# Patient Record
Sex: Female | Born: 1935 | Race: White | Hispanic: No | Smoking: Former smoker
Health system: Southern US, Community
[De-identification: ages and names within clinical notes are randomized; demographics above are authoritative.]

## PROBLEM LIST (undated history)

## (undated) DIAGNOSIS — B019 Varicella without complication: Secondary | ICD-10-CM

## (undated) DIAGNOSIS — C50919 Malignant neoplasm of unspecified site of unspecified female breast: Secondary | ICD-10-CM

## (undated) HISTORY — PX: ABDOMINAL HYSTERECTOMY: SHX81

## (undated) HISTORY — DX: Varicella without complication: B01.9

## (undated) HISTORY — PX: BREAST BIOPSY: SHX20

## (undated) HISTORY — PX: APPENDECTOMY: SHX54

---

## 2005-09-20 ENCOUNTER — Ambulatory Visit: Payer: Self-pay | Admitting: Family Medicine

## 2010-07-06 ENCOUNTER — Ambulatory Visit: Payer: Self-pay | Admitting: Family Medicine

## 2013-01-05 ENCOUNTER — Ambulatory Visit: Payer: Self-pay | Admitting: Nurse Practitioner

## 2013-01-21 ENCOUNTER — Ambulatory Visit: Payer: Self-pay | Admitting: Surgery

## 2013-01-21 LAB — HEMOGLOBIN: HGB: 13.3 g/dL

## 2013-01-29 ENCOUNTER — Ambulatory Visit: Payer: Self-pay | Admitting: Surgery

## 2013-02-09 ENCOUNTER — Ambulatory Visit: Payer: Self-pay | Admitting: Hematology and Oncology

## 2013-02-10 ENCOUNTER — Ambulatory Visit: Payer: Self-pay | Admitting: Hematology and Oncology

## 2013-02-25 ENCOUNTER — Ambulatory Visit: Payer: Self-pay | Admitting: Hematology and Oncology

## 2013-03-04 LAB — CBC CANCER CENTER
Basophil #: 0 x10 3/mm (ref 0.0–0.1)
Basophil %: 0.7 %
Eosinophil #: 0.1 x10 3/mm (ref 0.0–0.7)
Eosinophil %: 1.9 %
HCT: 39.2 % (ref 35.0–47.0)
MCH: 31.5 pg (ref 26.0–34.0)
MCHC: 33.9 g/dL (ref 32.0–36.0)
Monocyte %: 8.7 %
Neutrophil #: 2.5 x10 3/mm (ref 1.4–6.5)
Neutrophil %: 47.1 %
Platelet: 214 x10 3/mm (ref 150–440)
RBC: 4.21 10*6/uL (ref 3.80–5.20)
RDW: 14 % (ref 11.5–14.5)
WBC: 5.3 x10 3/mm (ref 3.6–11.0)

## 2013-03-04 LAB — COMPREHENSIVE METABOLIC PANEL
Albumin: 3.8 g/dL (ref 3.4–5.0)
Anion Gap: 5 — ABNORMAL LOW (ref 7–16)
BUN: 12 mg/dL (ref 7–18)
Co2: 30 mmol/L (ref 21–32)
Creatinine: 0.87 mg/dL (ref 0.60–1.30)
EGFR (African American): >60
Glucose: 100 mg/dL — ABNORMAL HIGH (ref 65–99)
Osmolality: 277 (ref 275–301)
Potassium: 4.8 mmol/L (ref 3.5–5.1)
SGOT(AST): 24 U/L (ref 15–37)
SGPT (ALT): 39 U/L (ref 12–78)
Sodium: 139 mmol/L (ref 136–145)

## 2013-03-28 ENCOUNTER — Ambulatory Visit: Payer: Self-pay | Admitting: Hematology and Oncology

## 2013-05-11 ENCOUNTER — Ambulatory Visit: Payer: Self-pay | Admitting: Hematology and Oncology

## 2013-05-11 LAB — CBC CANCER CENTER
Basophil #: 0 x10 3/mm (ref 0.0–0.1)
Basophil %: 0.6 %
HCT: 38 % (ref 35.0–47.0)
Lymphocyte #: 2.3 x10 3/mm (ref 1.0–3.6)
MCH: 30.2 pg (ref 26.0–34.0)
MCHC: 32.7 g/dL (ref 32.0–36.0)
Monocyte %: 7.5 %
Neutrophil #: 3.1 x10 3/mm (ref 1.4–6.5)
Neutrophil %: 52 %
Platelet: 236 x10 3/mm (ref 150–440)
RBC: 4.11 10*6/uL (ref 3.80–5.20)
RDW: 13.8 % (ref 11.5–14.5)
WBC: 5.9 x10 3/mm (ref 3.6–11.0)

## 2013-05-11 LAB — COMPREHENSIVE METABOLIC PANEL
Alkaline Phosphatase: 93 U/L
Bilirubin,Total: 0.3 mg/dL (ref 0.2–1.0)
Co2: 28 mmol/L (ref 21–32)
EGFR (Non-African Amer.): >60
Osmolality: 287 (ref 275–301)
SGOT(AST): 20 U/L (ref 15–37)
SGPT (ALT): 38 U/L (ref 12–78)
Sodium: 143 mmol/L (ref 136–145)

## 2013-05-28 ENCOUNTER — Ambulatory Visit: Payer: Self-pay | Admitting: Hematology and Oncology

## 2013-05-28 HISTORY — PX: MASTECTOMY: SHX3

## 2013-09-14 ENCOUNTER — Ambulatory Visit: Payer: Self-pay | Admitting: Hematology and Oncology

## 2013-09-14 LAB — COMPREHENSIVE METABOLIC PANEL
ALBUMIN: 3.6 g/dL (ref 3.4–5.0)
ALK PHOS: 109 U/L
Anion Gap: 7 (ref 7–16)
BUN: 16 mg/dL (ref 7–18)
Bilirubin,Total: 0.3 mg/dL (ref 0.2–1.0)
CALCIUM: 9.2 mg/dL (ref 8.5–10.1)
CREATININE: 0.92 mg/dL (ref 0.60–1.30)
Chloride: 103 mmol/L (ref 98–107)
Co2: 29 mmol/L (ref 21–32)
EGFR (Non-African Amer.): >60
Glucose: 94 mg/dL (ref 65–99)
Osmolality: 278 (ref 275–301)
POTASSIUM: 4.1 mmol/L (ref 3.5–5.1)
SGOT(AST): 19 U/L (ref 15–37)
SGPT (ALT): 31 U/L (ref 12–78)
SODIUM: 139 mmol/L (ref 136–145)
TOTAL PROTEIN: 7.5 g/dL (ref 6.4–8.2)

## 2013-09-14 LAB — CBC CANCER CENTER
BASOS ABS: 0.1 x10 3/mm (ref 0.0–0.1)
Basophil %: 1 %
EOS ABS: 0.1 x10 3/mm (ref 0.0–0.7)
EOS PCT: 1 %
HCT: 39.4 % (ref 35.0–47.0)
HGB: 12.8 g/dL (ref 12.0–16.0)
Lymphocyte #: 2.7 x10 3/mm (ref 1.0–3.6)
Lymphocyte %: 34.9 %
MCH: 29.9 pg (ref 26.0–34.0)
MCHC: 32.5 g/dL (ref 32.0–36.0)
MCV: 92 fL (ref 80–100)
Monocyte #: 0.5 x10 3/mm (ref 0.2–0.9)
Monocyte %: 7 %
NEUTROS ABS: 4.3 x10 3/mm (ref 1.4–6.5)
Neutrophil %: 56.1 %
Platelet: 251 x10 3/mm (ref 150–440)
RBC: 4.29 10*6/uL (ref 3.80–5.20)
RDW: 13.9 % (ref 11.5–14.5)
WBC: 7.7 x10 3/mm (ref 3.6–11.0)

## 2013-09-25 ENCOUNTER — Ambulatory Visit: Payer: Self-pay | Admitting: Hematology and Oncology

## 2013-12-02 ENCOUNTER — Ambulatory Visit: Payer: Self-pay | Admitting: Hematology and Oncology

## 2013-12-02 LAB — COMPREHENSIVE METABOLIC PANEL
ALK PHOS: 109 U/L
ALT: 34 U/L (ref 12–78)
Albumin: 3.8 g/dL (ref 3.4–5.0)
Anion Gap: 6 — ABNORMAL LOW (ref 7–16)
BUN: 16 mg/dL (ref 7–18)
Bilirubin,Total: 0.3 mg/dL (ref 0.2–1.0)
CALCIUM: 9.3 mg/dL (ref 8.5–10.1)
Chloride: 106 mmol/L (ref 98–107)
Co2: 31 mmol/L (ref 21–32)
Creatinine: 0.92 mg/dL (ref 0.60–1.30)
EGFR (African American): >60
EGFR (Non-African Amer.): >60
Glucose: 98 mg/dL (ref 65–99)
Osmolality: 286 (ref 275–301)
Potassium: 4.5 mmol/L (ref 3.5–5.1)
SGOT(AST): 19 U/L (ref 15–37)
Sodium: 143 mmol/L (ref 136–145)
TOTAL PROTEIN: 7.9 g/dL (ref 6.4–8.2)

## 2013-12-02 LAB — CBC CANCER CENTER
BASOS PCT: 0.8 %
Basophil #: 0.1 x10 3/mm (ref 0.0–0.1)
EOS PCT: 1.4 %
Eosinophil #: 0.1 x10 3/mm (ref 0.0–0.7)
HCT: 40.9 % (ref 35.0–47.0)
HGB: 13.5 g/dL (ref 12.0–16.0)
Lymphocyte #: 2.4 x10 3/mm (ref 1.0–3.6)
Lymphocyte %: 36.3 %
MCH: 30.6 pg (ref 26.0–34.0)
MCHC: 32.9 g/dL (ref 32.0–36.0)
MCV: 93 fL (ref 80–100)
MONOS PCT: 7.2 %
Monocyte #: 0.5 x10 3/mm (ref 0.2–0.9)
Neutrophil #: 3.6 x10 3/mm (ref 1.4–6.5)
Neutrophil %: 54.3 %
Platelet: 230 x10 3/mm (ref 150–440)
RBC: 4.4 10*6/uL (ref 3.80–5.20)
RDW: 14.2 % (ref 11.5–14.5)
WBC: 6.6 x10 3/mm (ref 3.6–11.0)

## 2013-12-03 LAB — CANCER ANTIGEN 27.29: CA 27.29: 27.1 U/mL (ref 0.0–38.6)

## 2013-12-26 ENCOUNTER — Ambulatory Visit: Payer: Self-pay | Admitting: Hematology and Oncology

## 2014-01-11 ENCOUNTER — Ambulatory Visit: Payer: Self-pay | Admitting: Oncology

## 2014-01-14 LAB — PATHOLOGY REPORT

## 2014-01-26 ENCOUNTER — Ambulatory Visit: Payer: Self-pay | Admitting: Hematology and Oncology

## 2014-09-17 NOTE — Discharge Summary (Signed)
PATIENT NAME:  Kristin Valdez, BROUSSARD MR#:  [id number] DATE OF BIRTH:  1935-12-08  DATE OF ADMISSION:  01/29/2013 DATE OF DISCHARGE:  01/30/2013  FINAL DIAGNOSIS: Left breast carcinoma.   PROCEDURES: Left simple mastectomy with axillary lymph node biopsy and partial axillary lymph node dissection.   HOSPITAL COURSE SUMMARY: The patient was admitted for observation postoperatively. She tolerated intravenous morphine well. Her Jackson-Pratt drain remained functional. On postoperative day #1, the patient was doing well, ambulating, eating a regular diet and tolerating oral pain medication. She was deemed suitable for discharge. She was taught Jackson-Pratt care. Follow up with me on September the 11th for staple removal.   DISCHARGE MEDICATIONS:  1.  Ibuprofen 200 mg by mouth b.i.d. as needed for pain.  2.  Percocet 5/325 1 tab every 6 hours as needed for pain. 3.  Call with any questions or concerns.   ____________________________ Jeannette How Marina Gravel, MD mab:nts D: 02/07/2013 17:49:57 ET T: 02/08/2013 01:39:04 ET JOB#: [id number]  cc: Elta Guadeloupe A. Marina Gravel, MD, <Dictator> Hortencia Conradi MD ELECTRONICALLY SIGNED 02/08/2013 17:52

## 2014-09-17 NOTE — Op Note (Signed)
PATIENT NAME:  Kristin Valdez, TOPPINS MR#:  [id number] DATE OF BIRTH:  1935/08/06  DATE OF PROCEDURE:  01/29/2013  PREOPERATIVE DIAGNOSIS: Left breast carcinoma.   POSTOPERATIVE DIAGNOSIS: Left breast carcinoma.  PROCEDURE PERFORMED: Left simple mastectomy with axillary lymph node biopsy of sentinel lymph nodes.   SURGEON: Sherri Rad, M.D.   ASSISTANT: None.   ANESTHESIA: General endotracheal.   FINDINGS: Sentinel lymph node was negative by frozen analysis.   ESTIMATED BLOOD LOSS: 100 mL.   DRAINS: 19 Blake x 2.   DESCRIPTION OF PROCEDURE: With informed consent, supine position, general anesthesia being induced, the patient's left breast, chest, arm and axilla were sterilely prepped and draped. Timeout was observed. I started with an incision encompassing the lateral aspect of the mastectomy incision with an attempt to identify the sentinel lymph node. Prior to prepping, a total of 2 mL mixed 1:1 with normal saline and methylene blue was infiltrated in the periareolar tissues. One solitary blue and hot lymph node was identified. Frozen section demonstrated no obvious macro-metastatic disease. Mastectomy was then performed in the usual fashion to the usual borders with electrocautery. Bleeding points were controlled with the Harmonic scalpel as well as a suture of 3-0 silk on the chest wall. The specimen was handed off the field. The wound was then irrigated. Sprayed thrombin was then placed over the muscle. Two drains were directed, one into the axilla and one into the chest wall. Drain sites were secured with nylon suture. With hemostasis being ensured on the operative field, the wound was then reapproximated utilizing interrupted deep dermal 3-0 Vicryl followed by skin staples in the skin. Bulky dressing was applied. The patient was then extubated and taken to the recovery room in stable and satisfactory condition by anesthesia services.    ____________________________ Jeannette How Marina Gravel,  MD mab:aw D: 01/30/2013 08:52:56 ET T: 01/30/2013 09:31:02 ET JOB#: [id number]  cc: Elta Guadeloupe A. Marina Gravel, MD, <Dictator> Hortencia Conradi MD ELECTRONICALLY SIGNED 02/03/2013 9:43

## 2014-09-20 DIAGNOSIS — J209 Acute bronchitis, unspecified: Secondary | ICD-10-CM | POA: Diagnosis not present

## 2014-10-11 DIAGNOSIS — H0012 Chalazion right lower eyelid: Secondary | ICD-10-CM | POA: Diagnosis not present

## 2014-10-22 DIAGNOSIS — H02053 Trichiasis without entropian right eye, unspecified eyelid: Secondary | ICD-10-CM | POA: Diagnosis not present

## 2014-12-09 DIAGNOSIS — H02053 Trichiasis without entropian right eye, unspecified eyelid: Secondary | ICD-10-CM | POA: Diagnosis not present

## 2014-12-20 IMAGING — NM NM SENTINAL NODE INJECTION (BREAST) - NO REPORT
2 series · 12 of 12 positions shown · non-contrast
Comparison: none

REASON FOR EXAM: L partial mastectomy 11am  [DATE]
COMMENTS:
TECHNIQUE: Using sterile technique and a 27 gauge, 1/2 inch needle, the
radiopharmaceutical was injected into the subcutaneous periareolar tissues
of the left breast. Planar images were obtained in the anterior and left
lateral projections, both with and without the use of a Po-30 transmission
source. The patient's arm was abducted at 90 degrees from the body for the
anterior  images, and raised over the head for the lateral images.

[Series 1000: sent node breast static · 2.40mm/px · 3 acquisitions, 6 frames shown]
[im 1/3]
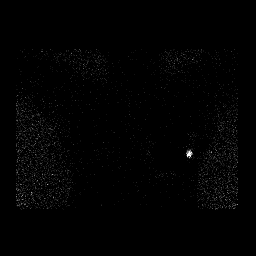
[im 1/3]
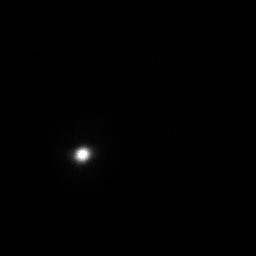
[im 2/3]
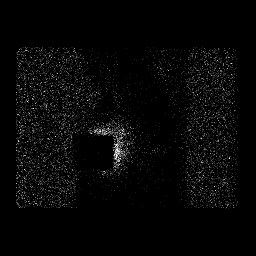
[im 2/3]
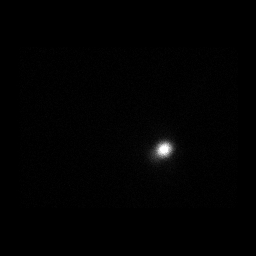
[im 3/3]
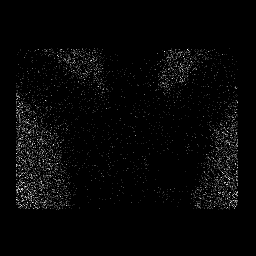
[im 3/3]
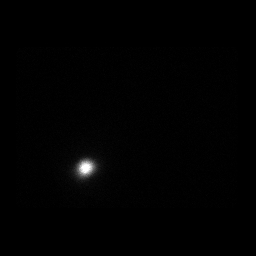

[Series 1000: sent node breast-dynamic · 4.80mm/px · 6 of 59 frames shown]
[frame 5/59  full-range]
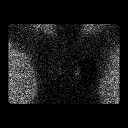
[frame 15/59  full-range]
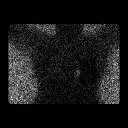
[frame 25/59  full-range]
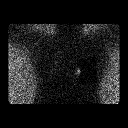
[frame 35/59  full-range]
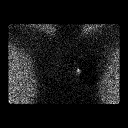
[frame 45/59  full-range]
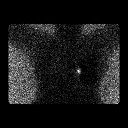
[frame 55/59  full-range]
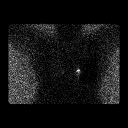

[12 of 12 positions shown; findings below may reference images not displayed]

PROCEDURE:     NM  - NM SENTINEL NODE  BREAST  - January 29, 2013 [DATE]

RESULT:     Comparison:  No comparison.

Radiopharmaceutical:  0.991 mCi Nc-VVm sulfur colloid in 2 ml volume,
injected subcutaneously into the periareolar tissues of the left breast.

Clinical Indication:  77-year-old female with carcinoma of the left breast.
Lymphatic mapping is requested prior to planned left axillary sentinel node
biopsy for interoperative localization.
FINDINGS: Images obtained at 90 minutes post injection demonstrate
localization of radiotracer in the region of the left lateral breast.
IMPRESSION: Successful injection of radiotracer into the left breast
periareolar soft tissues for interoperative localization of left breast
sentinel lymph nodes with a gamma probe.

## 2015-02-03 DIAGNOSIS — C44102 Unspecified malignant neoplasm of skin of right eyelid, including canthus: Secondary | ICD-10-CM | POA: Diagnosis not present

## 2015-02-03 DIAGNOSIS — H02053 Trichiasis without entropian right eye, unspecified eyelid: Secondary | ICD-10-CM | POA: Diagnosis not present

## 2015-02-03 DIAGNOSIS — H0012 Chalazion right lower eyelid: Secondary | ICD-10-CM | POA: Diagnosis not present

## 2015-04-07 DIAGNOSIS — H02053 Trichiasis without entropian right eye, unspecified eyelid: Secondary | ICD-10-CM | POA: Diagnosis not present

## 2015-07-04 DIAGNOSIS — H2513 Age-related nuclear cataract, bilateral: Secondary | ICD-10-CM | POA: Diagnosis not present

## 2015-07-04 DIAGNOSIS — H02052 Trichiasis without entropian right lower eyelid: Secondary | ICD-10-CM | POA: Diagnosis not present

## 2015-07-04 DIAGNOSIS — H02051 Trichiasis without entropian right upper eyelid: Secondary | ICD-10-CM | POA: Diagnosis not present

## 2015-07-04 DIAGNOSIS — H02059 Trichiasis without entropian unspecified eye, unspecified eyelid: Secondary | ICD-10-CM | POA: Diagnosis not present

## 2015-07-04 DIAGNOSIS — Z0100 Encounter for examination of eyes and vision without abnormal findings: Secondary | ICD-10-CM | POA: Diagnosis not present

## 2016-03-26 ENCOUNTER — Other Ambulatory Visit: Payer: Self-pay | Admitting: Family Medicine

## 2016-03-26 DIAGNOSIS — N6311 Unspecified lump in the right breast, upper outer quadrant: Secondary | ICD-10-CM

## 2016-04-13 ENCOUNTER — Ambulatory Visit
Admission: RE | Admit: 2016-04-13 | Discharge: 2016-04-13 | Disposition: A | Discharge status: Home / Self Care | Payer: Commercial Managed Care - HMO | Source: Ambulatory Visit | Attending: Family Medicine | Admitting: Family Medicine

## 2016-04-13 ENCOUNTER — Encounter: Payer: Self-pay | Admitting: Radiology

## 2016-04-13 DIAGNOSIS — N6311 Unspecified lump in the right breast, upper outer quadrant: Secondary | ICD-10-CM | POA: Diagnosis not present

## 2016-04-13 DIAGNOSIS — R922 Inconclusive mammogram: Secondary | ICD-10-CM | POA: Diagnosis not present

## 2016-04-13 HISTORY — DX: Malignant neoplasm of unspecified site of unspecified female breast: C50.919

## 2016-04-17 ENCOUNTER — Other Ambulatory Visit: Payer: Self-pay | Admitting: Family Medicine

## 2016-04-17 DIAGNOSIS — N6310 Unspecified lump in the right breast, unspecified quadrant: Secondary | ICD-10-CM

## 2016-05-02 ENCOUNTER — Ambulatory Visit
Admission: RE | Admit: 2016-05-02 | Discharge: 2016-05-02 | Disposition: A | Discharge status: Home / Self Care | Payer: Commercial Managed Care - HMO | Source: Ambulatory Visit | Attending: Family Medicine | Admitting: Family Medicine

## 2016-05-02 DIAGNOSIS — C50211 Malignant neoplasm of upper-inner quadrant of right female breast: Secondary | ICD-10-CM | POA: Diagnosis not present

## 2016-05-02 DIAGNOSIS — N6312 Unspecified lump in the right breast, upper inner quadrant: Secondary | ICD-10-CM | POA: Diagnosis not present

## 2016-05-02 DIAGNOSIS — C50911 Malignant neoplasm of unspecified site of right female breast: Secondary | ICD-10-CM | POA: Diagnosis not present

## 2016-05-02 DIAGNOSIS — N6310 Unspecified lump in the right breast, unspecified quadrant: Secondary | ICD-10-CM | POA: Insufficient documentation

## 2016-05-02 HISTORY — PX: BREAST BIOPSY: SHX20

## 2016-05-06 DIAGNOSIS — C50411 Malignant neoplasm of upper-outer quadrant of right female breast: Secondary | ICD-10-CM | POA: Insufficient documentation

## 2016-05-06 HISTORY — DX: Malignant neoplasm of upper-outer quadrant of right female breast: C50.411

## 2016-05-06 NOTE — Progress Notes (Deleted)
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  INTERVAL HISTORY: ***  REVIEW OF SYSTEMS:   ROS  As per HPI. Otherwise, a complete review of systems is negative.  PAST MEDICAL HISTORY: Past Medical History:  Diagnosis Date  . Breast cancer ([place]) 2014-2015   left breast cancer    PAST SURGICAL HISTORY: Past Surgical History:  Procedure Laterality Date  . BREAST BIOPSY Left    Stereotactic biopsy  . BREAST BIOPSY Right 05/02/2016   US guided biopsy  . MASTECTOMY Left 2015    FAMILY HISTORY: No family history on file.  ADVANCED DIRECTIVES (Y/N):  N  HEALTH MAINTENANCE: Social History  Substance Use Topics  . Smoking status: Not on file  . Smokeless tobacco: Not on file  . Alcohol use Not on file     Colonoscopy:  PAP:  Bone density:  Lipid panel:  Allergies not on file  No current outpatient prescriptions on file.   No current facility-administered medications for this visit.     OBJECTIVE: There were no vitals filed for this visit.   There is no height or weight on file to calculate BMI.    ECOG FS:{CHL ONC [id number]  General: Well-developed, well-nourished, no acute distress. Eyes: Pink conjunctiva, anicteric sclera. HEENT: Normocephalic, moist mucous membranes, clear oropharnyx. Lungs: Clear to auscultation bilaterally. Heart: Regular rate and rhythm. No rubs, murmurs, or gallops. Abdomen: Soft, nontender, nondistended. No organomegaly noted, normoactive bowel sounds. Musculoskeletal: No edema, cyanosis, or clubbing. Neuro: Alert, answering all questions appropriately. Cranial nerves grossly intact. Skin: No rashes or petechiae noted. Psych: Normal affect. Lymphatics: No cervical, calvicular, axillary or inguinal  LAD.   LAB RESULTS:  Lab Results  Component Value Date   NA 143 12/02/2013   K 4.5 12/02/2013   CL 106 12/02/2013   CO2 31 12/02/2013   GLUCOSE 98 12/02/2013   BUN 16 12/02/2013   CREATININE 0.92 12/02/2013   CALCIUM 9.3 12/02/2013   PROT 7.9 12/02/2013   ALBUMIN 3.8 12/02/2013   AST 19 12/02/2013   ALT 34 12/02/2013   ALKPHOS 109 12/02/2013   BILITOT 0.3 12/02/2013   GFRNONAA >60 12/02/2013   GFRAA >60 12/02/2013    Lab Results  Component Value Date   WBC 6.6 12/02/2013   NEUTROABS 3.6 12/02/2013   HGB 13.5 12/02/2013   HCT 40.9 12/02/2013   MCV 93 12/02/2013   PLT 230 12/02/2013     STUDIES: US Breast Ltd Uni Right Inc Axilla  Result Date: 04/13/2016 CLINICAL DATA:  80 year old female with history of left breast cancer status post mastectomy presenting for evaluation of a palpable right breast mass at the 10 o'clock position according to her physician. EXAM: 2D DIGITAL DIAGNOSTIC UNILATERAL RIGHT MAMMOGRAM WITH CAD AND ADJUNCT TOMO RIGHT BREAST ULTRASOUND COMPARISON:  Previous exam(s). ACR Breast Density Category c: The breast tissue is heterogeneously dense, which may obscure small masses. FINDINGS: A biopsy marking clip is identified in the upper-outer quadrant of the right breast. There are no suspicious mammographic findings in the vicinity of the marker. In the upper inner quadrant of the right breast, middle depth there is a possible obscured irregular mass, for which ultrasound will be performed for further evaluation. Mammographic images were processed with CAD. Physical exam of the upper-outer quadrant of the  right breast in the approximate 10 o'clock position at the site of concern identified by the patient's physician, no definite palpable masses are identified. No palpable masses are identified in the upper inner quadrant of the right breast. Ultrasound of the upper-outer quadrant of the right breast demonstrates a small normal-appearing 5 mm lymph node at 9  o'clock, 7 cm from the nipple. In the right breast at 1 o'clock, 2 cm from the nipple there is a subtle irregular hypoechoic shadowing mass measuring approximately 8 x 5 x 5 mm. Ultrasound of the right axilla demonstrates multiple normal-appearing lymph nodes. IMPRESSION: 1. There is no mammographic or targeted sonographic abnormalities in the upper-outer quadrant of the right breast at the site of concern identified by the patient's physician. A normal lymph node is identified at the 9 o'clock location. 2.  There is an indeterminate right breast mass at 1 o'clock. 3.  No evidence of right axillary lymphadenopathy. RECOMMENDATION: 1. Ultrasound-guided biopsy is recommended for the right breast mass at 1 o'clock. 2. Clinical follow-up recommended for the palpable area of concern in the upper-outer right breast. Any further workup should be based on clinical grounds. I have discussed the findings and recommendations with the patient. Results were also provided in writing at the conclusion of the visit. If applicable, a reminder letter will be sent to the patient regarding the next appointment. BI-RADS CATEGORY  4: Suspicious. Electronically Signed   By: Ammie Ferrier M.D.   On: 04/13/2016 15:34   Mm Diag Breast Tomo Uni Right  Result Date: 04/13/2016 CLINICAL DATA:  80 year old female with history of left breast cancer status post mastectomy presenting for evaluation of a palpable right breast mass at the 10 o'clock position according to her physician. EXAM: 2D DIGITAL DIAGNOSTIC UNILATERAL RIGHT MAMMOGRAM WITH CAD AND ADJUNCT TOMO RIGHT BREAST ULTRASOUND COMPARISON:  Previous exam(s). ACR Breast Density Category c: The breast tissue is heterogeneously dense, which may obscure small masses. FINDINGS: A biopsy marking clip is identified in the upper-outer quadrant of the right breast. There are no suspicious mammographic findings in the vicinity of the marker. In the upper inner quadrant of the right breast,  middle depth there is a possible obscured irregular mass, for which ultrasound will be performed for further evaluation. Mammographic images were processed with CAD. Physical exam of the upper-outer quadrant of the right breast in the approximate 10 o'clock position at the site of concern identified by the patient's physician, no definite palpable masses are identified. No palpable masses are identified in the upper inner quadrant of the right breast. Ultrasound of the upper-outer quadrant of the right breast demonstrates a small normal-appearing 5 mm lymph node at 9 o'clock, 7 cm from the nipple. In the right breast at 1 o'clock, 2 cm from the nipple there is a subtle irregular hypoechoic shadowing mass measuring approximately 8 x 5 x 5 mm. Ultrasound of the right axilla demonstrates multiple normal-appearing lymph nodes. IMPRESSION: 1. There is no mammographic or targeted sonographic abnormalities in the upper-outer quadrant of the right breast at the site of concern identified by the patient's physician. A normal lymph node is identified at the 9 o'clock location. 2.  There is an indeterminate right breast mass at 1 o'clock. 3.  No evidence of right axillary lymphadenopathy. RECOMMENDATION: 1. Ultrasound-guided biopsy is recommended for the right breast mass at 1 o'clock. 2. Clinical follow-up recommended for the palpable area of concern in the upper-outer right breast. Any further workup should be based on clinical  grounds. I have discussed the findings and recommendations with the patient. Results were also provided in writing at the conclusion of the visit. If applicable, a reminder letter will be sent to the patient regarding the next appointment. BI-RADS CATEGORY  4: Suspicious. Electronically Signed   By: Ammie Ferrier M.D.   On: 04/13/2016 15:34   Mm Clip Placement Right  Result Date: 05/02/2016 CLINICAL DATA:  Status post ultrasound-guided right breast biopsy EXAM: DIAGNOSTIC RIGHT MAMMOGRAM POST  ULTRASOUND BIOPSY COMPARISON:  Previous exam(s). FINDINGS: Mammographic images were obtained following ultrasound guided biopsy of a right breast mass at 1 o'clock, 2 cm from the nipple. Post biopsy mammogram demonstrates the [facility] shape 4 butterfly marker to be in the expected location within the upper, inner right breast. The mass seen within the upper, inner right breast on the patient's diagnostic mammogram is obscured by post biopsy change. IMPRESSION: Appropriate marker position as above. Final Assessment: Post Procedure Mammograms for Marker Placement Electronically Signed   By: Pamelia Hoit M.D.   On: 05/02/2016 09:26   [place] Rt Breast Bx W Loc Dev 1st Lesion Img Bx Spec US Guide  Result Date: 05/02/2016 CLINICAL DATA:  80 year old female for ultrasound-guided biopsy of an indeterminate right breast mass EXAM: ULTRASOUND GUIDED RIGHT BREAST CORE NEEDLE BIOPSY COMPARISON:  Previous exam(s). FINDINGS: I met with the patient and we discussed the procedure of ultrasound-guided biopsy, including benefits and alternatives. We discussed the high likelihood of a successful procedure. We discussed the risks of the procedure, including infection, bleeding, tissue injury, clip migration, and inadequate sampling. Informed written consent was given. The usual time-out protocol was performed immediately prior to the procedure. Using sterile technique and 1% Lidocaine as local anesthetic, under direct ultrasound visualization, a 12 gauge spring-loaded device was used to perform biopsy of an indeterminate right breast mass at 1 o'clock, 2 cm from the nipple using a medial to lateral approach. At the conclusion of the procedure a [employer] shape 4 butterfly tissue marker clip was deployed into the biopsy cavity. Follow up 2 view mammogram was performed and dictated separately. IMPRESSION: Ultrasound guided biopsy of an indeterminate right breast mass at 1 o'clock, 2 cm from the nipple. No apparent complications.  Electronically Signed   By: Pamelia Hoit M.D.   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  PLAN:    1. 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 She also understands that She can call clinic at any time with any questions, concerns, or complaints.   Primary cancer of upper outer quadrant of right female breast [facility])   Staging form: Breast, AJCC 7th Edition   - Clinical stage from 05/06/2016: Stage IA (T1b, N0, M0) - Signed by Lloyd Huger, MD on 05/06/2016  Lloyd Huger, MD   05/06/2016 10:40 PM

## 2016-05-07 ENCOUNTER — Inpatient Hospital Stay: Payer: Commercial Managed Care - HMO | Attending: Oncology | Admitting: Oncology

## 2016-05-07 ENCOUNTER — Ambulatory Visit: Payer: Self-pay | Admitting: Surgery

## 2016-05-07 ENCOUNTER — Inpatient Hospital Stay: Payer: Commercial Managed Care - HMO | Admitting: Oncology

## 2016-05-07 ENCOUNTER — Encounter: Payer: Self-pay | Admitting: Oncology

## 2016-05-07 VITALS — BP 159/88 | HR 93 | Temp 97.9°F | Resp 18 | Ht 65.0 in | Wt 167.0 lb

## 2016-05-07 DIAGNOSIS — Z170 Estrogen receptor positive status [ER+]: Secondary | ICD-10-CM

## 2016-05-07 DIAGNOSIS — Z853 Personal history of malignant neoplasm of breast: Secondary | ICD-10-CM

## 2016-05-07 DIAGNOSIS — Z9223 Personal history of estrogen therapy: Secondary | ICD-10-CM | POA: Diagnosis not present

## 2016-05-07 DIAGNOSIS — Z9012 Acquired absence of left breast and nipple: Secondary | ICD-10-CM | POA: Diagnosis not present

## 2016-05-07 DIAGNOSIS — M1990 Unspecified osteoarthritis, unspecified site: Secondary | ICD-10-CM

## 2016-05-07 DIAGNOSIS — C50411 Malignant neoplasm of upper-outer quadrant of right female breast: Secondary | ICD-10-CM

## 2016-05-07 NOTE — Progress Notes (Signed)
New evaluation for breast cancer. Offers no complaints. 

## 2016-05-07 NOTE — Progress Notes (Signed)
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  INTERVAL HISTORY: Patient is a 80 year old female with a history of left breast cancer who was noted to have an abnormality on routine mammogram of her right breast. Subsequent biopsy revealed the above stated breast cancer which appears to be a second primary. Currently, patient feels well and is asymptomatic. She has no neurologic complaints. She denies any recent fevers or illnesses. She has a good appetite and denies weight loss. She has no chest pain or shortness of breath. She denies any nausea, vomiting, constipation, or diarrhea. She has no urinary complaints. Patient feels at her baseline and offers no specific complaints today.  REVIEW OF SYSTEMS:   Review of Systems  Constitutional: Negative.  Negative for fever, malaise/fatigue and weight loss.  Respiratory: Negative.  Negative for cough.   Cardiovascular: Negative.  Negative for chest pain and leg swelling.  Gastrointestinal: Negative.  Negative for abdominal pain.  Genitourinary: Negative.   Musculoskeletal: Negative.   Neurological: Negative.  Negative for weakness.  Psychiatric/Behavioral: The patient is not nervous/anxious.     As per HPI. Otherwise, a complete review of systems is negative.  PAST MEDICAL HISTORY: Past Medical History:  Diagnosis Date  . Anxiety   . Arthritis   . Breast cancer ([place]) 2014-2015   left breast cancer  . Depression   . Numbness and tingling     PAST SURGICAL HISTORY: Past Surgical History:  Procedure Laterality Date  . BREAST BIOPSY Left    Stereotactic biopsy  . BREAST BIOPSY Right 05/02/2016   US guided biopsy  . MASTECTOMY  Left 2015    FAMILY HISTORY: No family history on file.  ADVANCED DIRECTIVES (Y/N):  N  HEALTH MAINTENANCE: Social History  Substance Use Topics  . Smoking status: Not on file  . Smokeless tobacco: Not on file  . Alcohol use Yes     Comment: Occasional     Colonoscopy:  PAP:  Bone density:  Lipid panel:  Allergies  Allergen Reactions  . Excedrin Extra Strength [Asa-Apap-Caff Buffered] Other (See Comments)    Shaking    No current outpatient prescriptions on file.   No current facility-administered medications for this visit.     OBJECTIVE: Vitals:   05/07/16 1455  BP: (!) 159/88  Pulse: 93  Resp: 18  Temp: 97.9 F (36.6 C)     Body mass index is 27.79 kg/m.    ECOG FS:0 - Asymptomatic  General: Well-developed, well-nourished, no acute distress. Eyes: Pink conjunctiva, anicteric sclera. Breasts: Left breast mastectomy. HEENT: Normocephalic, moist mucous membranes, clear oropharnyx. Lungs: Clear to auscultation bilaterally. Heart: Regular rate and rhythm. No rubs, murmurs, or gallops. Abdomen: Soft, nontender, nondistended. No organomegaly noted, normoactive bowel sounds. Musculoskeletal: No edema, cyanosis, or clubbing. Neuro: Alert, answering all questions appropriately. Cranial nerves grossly intact. Skin: No rashes or petechiae noted. Psych: Normal affect. Lymphatics: No cervical, calvicular, axillary or inguinal LAD.   LAB RESULTS:  Lab Results  Component Value Date   NA 143 12/02/2013   K 4.5 12/02/2013   CL 106 12/02/2013   CO2 31 12/02/2013   GLUCOSE 98 12/02/2013   BUN 16 12/02/2013   CREATININE 0.92 12/02/2013   CALCIUM 9.3 12/02/2013  PROT 7.9 12/02/2013   ALBUMIN 3.8 12/02/2013   AST 19 12/02/2013   ALT 34 12/02/2013   ALKPHOS 109 12/02/2013   BILITOT 0.3 12/02/2013   GFRNONAA >60 12/02/2013   GFRAA >60 12/02/2013    Lab Results  Component Value Date   WBC 6.6 12/02/2013   NEUTROABS 3.6 12/02/2013   HGB 13.5 12/02/2013    HCT 40.9 12/02/2013   MCV 93 12/02/2013   PLT 230 12/02/2013     STUDIES: US Breast Ltd Uni Right Inc Axilla  Result Date: 04/13/2016 CLINICAL DATA:  80 year old female with history of left breast cancer status post mastectomy presenting for evaluation of a palpable right breast mass at the 10 o'clock position according to her physician. EXAM: 2D DIGITAL DIAGNOSTIC UNILATERAL RIGHT MAMMOGRAM WITH CAD AND ADJUNCT TOMO RIGHT BREAST ULTRASOUND COMPARISON:  Previous exam(s). ACR Breast Density Category c: The breast tissue is heterogeneously dense, which may obscure small masses. FINDINGS: A biopsy marking clip is identified in the upper-outer quadrant of the right breast. There are no suspicious mammographic findings in the vicinity of the marker. In the upper inner quadrant of the right breast, middle depth there is a possible obscured irregular mass, for which ultrasound will be performed for further evaluation. Mammographic images were processed with CAD. Physical exam of the upper-outer quadrant of the right breast in the approximate 10 o'clock position at the site of concern identified by the patient's physician, no definite palpable masses are identified. No palpable masses are identified in the upper inner quadrant of the right breast. Ultrasound of the upper-outer quadrant of the right breast demonstrates a small normal-appearing 5 mm lymph node at 9 o'clock, 7 cm from the nipple. In the right breast at 1 o'clock, 2 cm from the nipple there is a subtle irregular hypoechoic shadowing mass measuring approximately 8 x 5 x 5 mm. Ultrasound of the right axilla demonstrates multiple normal-appearing lymph nodes. IMPRESSION: 1. There is no mammographic or targeted sonographic abnormalities in the upper-outer quadrant of the right breast at the site of concern identified by the patient's physician. A normal lymph node is identified at the 9 o'clock location. 2.  There is an indeterminate right breast mass at  1 o'clock. 3.  No evidence of right axillary lymphadenopathy. RECOMMENDATION: 1. Ultrasound-guided biopsy is recommended for the right breast mass at 1 o'clock. 2. Clinical follow-up recommended for the palpable area of concern in the upper-outer right breast. Any further workup should be based on clinical grounds. I have discussed the findings and recommendations with the patient. Results were also provided in writing at the conclusion of the visit. If applicable, a reminder letter will be sent to the patient regarding the next appointment. BI-RADS CATEGORY  4: Suspicious. Electronically Signed   By: Ammie Ferrier M.D.   On: 04/13/2016 15:34   Mm Diag Breast Tomo Uni Right  Result Date: 04/13/2016 CLINICAL DATA:  80 year old female with history of left breast cancer status post mastectomy presenting for evaluation of a palpable right breast mass at the 10 o'clock position according to her physician. EXAM: 2D DIGITAL DIAGNOSTIC UNILATERAL RIGHT MAMMOGRAM WITH CAD AND ADJUNCT TOMO RIGHT BREAST ULTRASOUND COMPARISON:  Previous exam(s). ACR Breast Density Category c: The breast tissue is heterogeneously dense, which may obscure small masses. FINDINGS: A biopsy marking clip is identified in the upper-outer quadrant of the right breast. There are no suspicious mammographic findings in the vicinity of the marker. In the upper inner quadrant of the right breast, middle  depth there is a possible obscured irregular mass, for which ultrasound will be performed for further evaluation. Mammographic images were processed with CAD. Physical exam of the upper-outer quadrant of the right breast in the approximate 10 o'clock position at the site of concern identified by the patient's physician, no definite palpable masses are identified. No palpable masses are identified in the upper inner quadrant of the right breast. Ultrasound of the upper-outer quadrant of the right breast demonstrates a small normal-appearing 5 mm  lymph node at 9 o'clock, 7 cm from the nipple. In the right breast at 1 o'clock, 2 cm from the nipple there is a subtle irregular hypoechoic shadowing mass measuring approximately 8 x 5 x 5 mm. Ultrasound of the right axilla demonstrates multiple normal-appearing lymph nodes. IMPRESSION: 1. There is no mammographic or targeted sonographic abnormalities in the upper-outer quadrant of the right breast at the site of concern identified by the patient's physician. A normal lymph node is identified at the 9 o'clock location. 2.  There is an indeterminate right breast mass at 1 o'clock. 3.  No evidence of right axillary lymphadenopathy. RECOMMENDATION: 1. Ultrasound-guided biopsy is recommended for the right breast mass at 1 o'clock. 2. Clinical follow-up recommended for the palpable area of concern in the upper-outer right breast. Any further workup should be based on clinical grounds. I have discussed the findings and recommendations with the patient. Results were also provided in writing at the conclusion of the visit. If applicable, a reminder letter will be sent to the patient regarding the next appointment. BI-RADS CATEGORY  4: Suspicious. Electronically Signed   By: Ammie Ferrier M.D.   On: 04/13/2016 15:34   Mm Clip Placement Right  Result Date: 05/02/2016 CLINICAL DATA:  Status post ultrasound-guided right breast biopsy EXAM: DIAGNOSTIC RIGHT MAMMOGRAM POST ULTRASOUND BIOPSY COMPARISON:  Previous exam(s). FINDINGS: Mammographic images were obtained following ultrasound guided biopsy of a right breast mass at 1 o'clock, 2 cm from the nipple. Post biopsy mammogram demonstrates the [facility] shape 4 butterfly marker to be in the expected location within the upper, inner right breast. The mass seen within the upper, inner right breast on the patient's diagnostic mammogram is obscured by post biopsy change. IMPRESSION: Appropriate marker position as above. Final Assessment: Post Procedure Mammograms for Marker  Placement Electronically Signed   By: Pamelia Hoit M.D.   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 PRELIMINARY GRADE 2. This was found to be concordant with Dr. Raul Del impression and notes. Recommendations:  Surgical and oncology referrals. At the patient's request, pathology and recommendations were relayed to the patient by phone by Dr. Theda Sers on 05/03/16. The patient stated she has done well following the biopsy. All of her questions were answered. She was encouraged to call the [facility] with any further questions or concerns. Referrals were made by Al Pimple, RN, nurse navigator for [facility] with Dr. Grayland Ormond, oncologist on 05/07/16 at 8:30 AM and Dr. Azalee Course, surgeon on 05/08/16 at 2:15 PM. The patient has been informed of the appointments. Results, recommendations and appointment information have been relayed to Dr. Claudette Laws nurse. Addendum by Jetta Lout, RRA on 05/04/16. Electronically Signed   By: Pamelia Hoit M.D.   On:  05/04/2016 10:44   Result Date: 05/07/2016 CLINICAL DATA:  80 year old female for ultrasound-guided biopsy of an indeterminate right breast mass EXAM: ULTRASOUND GUIDED RIGHT BREAST CORE NEEDLE BIOPSY COMPARISON:  Previous exam(s). FINDINGS: I met with the patient and we discussed the procedure of ultrasound-guided biopsy, including benefits and alternatives. We discussed the high likelihood of a successful procedure. We discussed the risks of the procedure, including infection, bleeding, tissue injury, clip migration, and inadequate sampling. Informed written consent was given. The usual time-out protocol was performed immediately prior to the procedure. Using sterile  technique and 1% Lidocaine as local anesthetic, under direct ultrasound visualization, a 12 gauge spring-loaded device was used to perform biopsy of an indeterminate right breast mass at 1 o'clock, 2 cm from the nipple using a medial to lateral approach. At the conclusion of the procedure a HydroMARK shape 4 butterfly tissue marker clip was deployed into the biopsy cavity. Follow up 2 view mammogram was performed and dictated separately. IMPRESSION: Ultrasound guided biopsy of an indeterminate right breast mass at 1 o'clock, 2 cm from the nipple. No apparent complications. Electronically Signed: By: Pamelia Hoit M.D. 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  PLAN:    1. 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 She did not receive adjuvant chemotherapy or XRT at that time. She reports taking an aromatase inhibitor for 3-4 weeks, but then discontinued secondary to side effects and did not try another medication. It appears based on pathology, that the cancer in her right breast is a second primary. Patient has an appointment with surgery on May 18, 2016 for consideration of either lumpectomy or mastectomy. Patient has a already indicated that she likely will pursue mastectomy like she did several years ago. HER-2 is pending at time of dictation, the patient has also indicated that she likely refuse chemotherapy again. She would be willing to try another aromatase inhibitor. Return to clinic one to 2 weeks after her surgery for further evaluation and treatment planning.  Approximately 45 minutes was spent in discussion of which greater than 50% was consultation.   Patient expressed understanding and was in agreement with this plan. She  also understands that She can call clinic at any time with any questions, concerns, or complaints.   Primary cancer of upper outer quadrant of right female breast [facility])   Staging form: Breast, AJCC 7th Edition   - Clinical stage from 05/06/2016: Stage IA (T1b, N0, M0) - Signed by Lloyd Huger, MD on 05/06/2016  Lloyd Huger, MD   05/09/2016 1:11 PM

## 2016-05-08 NOTE — Progress Notes (Signed)
Oncology Nurse Navigator Documentation  Navigator Location: [facility]-Med Onc (05/08/16 1300) Referral date to RadOnc/MedOnc: 05/08/16 (05/08/16 1300) )Navigator Encounter Type: Initial MedOnc (05/08/16 1300)                     Patient Visit Type: Initial;MedOnc (05/08/16 1300) Treatment Phase: Pre-Tx/Tx Discussion (05/08/16 1300)                            Time Spent with Patient: 60 (05/08/16 1300)  Met with patient and daughter at initial Med Onc visit with D. Finnegan.  Patient reports she stopped taking antihormonal medication with previous breast cancer due to side effects of nausea, vomiting.  Dr. Grayland Ormond reviewed treatment plan pending outcome of surgical pathology.  Given folder with updated services provided to breast cancer patients. To follow-up post surgery.

## 2016-05-08 NOTE — Progress Notes (Signed)
  Oncology Nurse Navigator Documentation  Navigator Location: [facility]-Med Onc (05/04/16 1200)   )Navigator Encounter Type: Introductory phone call;Education (05/04/16 1200)   Abnormal Finding Date: 04/13/16 (05/04/16 1200) Confirmed Diagnosis Date: 05/02/16 (05/04/16 1200)               Patient Visit Type: Initial (05/04/16 1200)   Barriers/Navigation Needs: Education;Coordination of Care;Financial (05/04/16 1200)  Notified by radiology that patient needs Surgery and Med/Onc consult.  She has a history of left breast cancer with mastectomy .  She saw Dr. Marina Gravel ,and Dr. Kallie Edward at that time.  Per patient request she is scheduled with Kristin Valdez on 05/18/16 at 2:15, and with Dr. Grayland Ormond on 05/07/16 at 0830.  Notified Dr. Kandice Robinsons for referral and prior authorization requirement.  Patient states she has Breast Cancer Treatment Handbook, but she would like updated information on services provided to breast cancer patients.

## 2016-05-14 LAB — SURGICAL PATHOLOGY

## 2016-05-15 DIAGNOSIS — M8580 Other specified disorders of bone density and structure, unspecified site: Secondary | ICD-10-CM | POA: Insufficient documentation

## 2016-05-15 DIAGNOSIS — E559 Vitamin D deficiency, unspecified: Secondary | ICD-10-CM | POA: Insufficient documentation

## 2016-05-16 ENCOUNTER — Ambulatory Visit (INDEPENDENT_AMBULATORY_CARE_PROVIDER_SITE_OTHER): Payer: Commercial Managed Care - HMO | Admitting: Surgery

## 2016-05-16 ENCOUNTER — Other Ambulatory Visit: Payer: Self-pay

## 2016-05-16 ENCOUNTER — Encounter: Payer: Self-pay | Admitting: Surgery

## 2016-05-16 VITALS — BP 149/88 | HR 102 | Temp 98.2°F | Ht 65.0 in | Wt 164.4 lb

## 2016-05-16 DIAGNOSIS — C50911 Malignant neoplasm of unspecified site of right female breast: Secondary | ICD-10-CM

## 2016-05-16 DIAGNOSIS — C50411 Malignant neoplasm of upper-outer quadrant of right female breast: Secondary | ICD-10-CM | POA: Diagnosis not present

## 2016-05-16 NOTE — Progress Notes (Signed)
Subjective:     Patient ID: Kristin Valdez, female   DOB: 1935/07/11, 80 y.o.   MRN: [id number]  HPI  80 yr old female with right breast invasive mammary carcinoma..She is well-known to the surgery service and had a left breast mastectomy after having left breast cancer this triple positive for ER/PR and HER-2 back in 2014. The patient at that time attempted to take aromatase inhibitors due to the symptoms only have them for 3-4 weeks and then stopped and refused to take any chemotherapy for the HER-2 positivity at that time. The patient noticed a small mass in her right breast and was concerned about it and was seen by her primary care physician. The patient had a regular mammogram in noticed an area of suspicion this was biopsied and found to be invasive mammary carcinoma which appears to be evident different histology with ER positive PR negative and HER-2 equivocal this time with features of pleomorphic lobular carcinoma.  She started her menses at age 80 and then had a hysterectomy in 29 and has not had a period since. The patient did take Premarin for about 5 years but otherwise no other birth control or hormone replacement therapy. She did have a daughter at the age of 82 and she did not breast-feed. She has a personal history of breast cancer as discussed previously and a father who have prostate cancer but no other family members with cancer. She is quite frustrated at this time but is otherwise healthy and takes no medications.   Past Medical History:  Diagnosis Date  . Anxiety   . Arthritis   . Breast cancer ([place]) 2014-2015   left breast cancer  . Depression   . Numbness and tingling    Past Surgical History:  Procedure Laterality Date  . BREAST BIOPSY Left    Stereotactic biopsy  . BREAST BIOPSY Right 05/02/2016   US guided biopsy  . MASTECTOMY Left 2015   Family History  Problem Relation Age of Onset  . Heart disease Mother   . Prostate cancer Father   . Stroke Brother   .  Healthy Daughter    Social History   Social History  . Marital status: Widowed    Spouse name: N/A  . Number of children: N/A  . Years of education: N/A   Social History Main Topics  . Smoking status: Former Smoker    Quit date: 01/09/1971  . Smokeless tobacco: Never Used  . Alcohol use Yes     Comment: Occasional  . Drug use: No  . Sexual activity: Not Asked   Other Topics Concern  . None   Social History Narrative  . None   No current outpatient prescriptions on file. Allergies  Allergen Reactions  . Excedrin Extra Strength [Asa-Apap-Caff Buffered] Other (See Comments)    Shaking     Review of Systems  Constitutional: Negative for activity change, appetite change, chills, fatigue, fever and unexpected weight change.  HENT: Negative for congestion and sore throat.   Respiratory: Negative for apnea, cough, choking, chest tightness, shortness of breath and stridor.   Cardiovascular: Negative for chest pain, palpitations and leg swelling.  Gastrointestinal: Negative for abdominal pain, constipation, diarrhea, nausea and rectal pain.  Genitourinary: Negative for dysuria and hematuria.  Musculoskeletal: Negative for back pain and joint swelling.  Skin: Negative for color change, pallor, rash and wound.  Neurological: Negative for dizziness and weakness.  Hematological: Negative for adenopathy. Does not bruise/bleed easily.  Psychiatric/Behavioral: Negative for agitation.  The patient is not nervous/anxious.   All other systems reviewed and are negative.      Vitals:   05/16/16 1107  BP: (!) 149/88  Pulse: (!) 102  Temp: 98.2 F (36.8 C)    Objective:   Physical Exam  Constitutional: She is oriented to person, place, and time. She appears well-developed and well-nourished. No distress.  HENT:  Head: Normocephalic and atraumatic.  Right Ear: External ear normal.  Left Ear: External ear normal.  Nose: Nose normal.  Mouth/Throat: Oropharynx is clear and moist.  No oropharyngeal exudate.  Eyes: Conjunctivae and EOM are normal. Pupils are equal, round, and reactive to light. No scleral icterus.  Neck: Normal range of motion. Neck supple. No tracheal deviation present.  Cardiovascular: Normal rate, regular rhythm, normal heart sounds and intact distal pulses.  Exam reveals no gallop and no friction rub.   No murmur heard. Pulmonary/Chest: Effort normal and breath sounds normal. No respiratory distress. She has no wheezes. She has no rales.  Right breast: Some bruising in the upper part of the breast otherwise no skin changes no dimpling no nipple discharge or retraction, small less than 1 cm palpable mass in the 12:00 position approximately 2 cm from the nipple, no other masses palpated and no lymphadenopathy in the axilla  Left breast: Well-healed mastectomy site without any masses palpable underneath and no lymphadenopathy in the axilla  Abdominal: Bowel sounds are normal. She exhibits no distension. There is no tenderness. There is no rebound and no guarding.  Musculoskeletal: Normal range of motion. She exhibits no edema, tenderness or deformity.  Neurological: She is oriented to person, place, and time. She has normal reflexes. No cranial nerve deficit.  Skin: Skin is dry. No rash noted. No erythema. No pallor.  Psychiatric: She has a normal mood and affect. Her behavior is normal. Judgment and thought content normal.  Vitals reviewed.     Imaging:   IMPRESSION: Ultrasound guided biopsy of an indeterminate right breast mass at 1 o'clock, 2 cm from the nipple. No apparent complications.  FINDINGS: Mammographic images were obtained following ultrasound guided biopsy of a right breast mass at 1 o'clock, 2 cm from the nipple. Post biopsy mammogram demonstrates the [facility] shape 4 butterfly marker to be in the expected location within the upper, inner right breast. The mass seen within the upper, inner right breast on the patient's diagnostic  mammogram is obscured by post biopsy change.  Ultrasound of the upper-outer quadrant of the right breast demonstrates a small normal-appearing 5 mm lymph node at 9 o'clock, 7 cm from the nipple. In the right breast at 1 o'clock, 2 cm from the nipple there is a subtle irregular hypoechoic shadowing mass measuring approximately 8 x 5 x 5 mm. Ultrasound of the right axilla demonstrates multiple normal-appearing lymph nodes.  Pathology:  DIAGNOSIS:  A. BREAST, RIGHT 1:00, 2 CM FN; ULTRASOUND GUIDED BIOPSY:  - INVASIVE MAMMARY CARCINOMA WITH FEATURES SUGGESTIVE OF PLEOMORPHIC  LOBULAR CARCINOMA.  - SIZE: 10 MM.  - PRELIMINARY GRADE: 2 (NOTTINGHAM GRADE)    ARCHITECTURE GRADE: 3    NUCLEAR GRADE: 3    MITOTIC GRADE: 1  BREAST BIOMARKER TESTS  Estrogen Receptor (ER) Status: Positive, greater than 90%  Progesterone Receptor (PgR) Status: Negative, less than 1%  HER2 (by immunohistochemistry): Equivocal, 2+  Percentage of cells with uniform intense complete membrane staining: 2 %  HER2 (ERBB2) (by in situ hybridization): ordered, will be resulted in an  addendum   Assessment:  80 yr old female with right breast invasive mammary carcinoma    Plan:     I have personally reviewed her past medical history which is only positive for left breast cancer back in 2014 which was noted in my HPI,  she takes no other medications and has no other chronic diseases have personally reviewed her ultrasound and mammogram images which do show a suspicious area in the 12 to 1:00 position. I personally reviewed her pathology report which does show lobular carcinoma this time with ER positive PR negative and HER-2/neu equivocal.   I discussed the available options with the patient and daughter. The risk of recurrence is similar between mastectomy and lumpectomy with radiation.The patient is only interested in having a mastectomy which I think is a good option for her. I also discussed that we would  need to do a sentinel lymph node biopsy to check the nodes. Explained to the patient that after her surgical treatment she would also need an aromatase inhibitor since her previous tumor was ER/PR positive and her tumor this time is ER positive would still recommend using an aromatase inhibitor but can try different drug than she was on the first time.   I discussed risks of bleeding, infection, damage to surrounding tissues, having positive margins, needing further resection, damage to nerves causing arm numbness or difficulty raising arm, causing lymphoedema in the arm; as well as anesthesia risks of MI, stroke, prolonged ventilation, pulmonary embolism, thrombosis and even death. Patient and daughter were given the opportunity to ask questions and have them answered.The would like to proceed with Right mastectomy with sentinel lymph node biopsy.

## 2016-05-16 NOTE — Patient Instructions (Signed)
We have spoken today about breast surgery. We will schedule your Mastectomy for 05/24/16 at [facility] with Dr. Azalee Course.  You will need to bring any FMLA (Leave Paperwork) into our office prior to the surgery and we will get this back to your employer within 3 business days.  You will most likely spend at least 1 night in the hospital following surgery.  Please see the information provided on a Mastectomy.  Please see the (blue) pre-care surgery sheet that you have been given today for more information regarding your surgery.   Total or Modified Radical Mastectomy A total mastectomy and a modified radical mastectomy are types of surgery for breast cancer. If you are having a total mastectomy (simple mastectomy), your entire breast will be removed. If you are having a modified radical mastectomy, your breast and nipple will be removed along with the lymph nodes under your arm. You may also have some of the lining over the muscle tissues under your breast removed. LET [facility] CARE PROVIDER KNOW ABOUT:  Any allergies you have.  All medicines you are taking, including vitamins, herbs, eye drops, creams, and over-the-counter medicines.  Previous problems you or members of your family have had with the use of anesthetics.  Any blood disorders you have.  Previous surgeries you have had.  Medical conditions you have. RISKS AND COMPLICATIONS Generally, this is a safe procedure. However, problems may occur, including:  Pain.  Infection.  Bleeding.  Scar tissue.  Chest numbness on the side of the surgery.  Fluid buildup under the skin flaps where your breast was removed (seroma).  Sensation of throbbing or tingling.  Stress or sadness from losing your breast. If you have the lymph nodes under your arm removed, you may have arm swelling, weakness, or numbness on the same side of your body as your surgery. BEFORE THE PROCEDURE  Ask your health care provider about:  Changing or  stopping your regular medicines. This is especially important if you are taking diabetes medicines or blood thinners.  Taking medicines such as aspirin and ibuprofen. These medicines can thin your blood. Do not take these medicines before your procedure if your health care provider instructs you not to.  Follow your health care provider's instructions about eating or drinking restrictions.  Plan to have someone take you home after the procedure. PROCEDURE  An IV tube will be inserted into one of your veins.  You will be given a medicine that makes you fall asleep (general anesthetic).  Your breast will be cleaned with a germ-killing solution (antiseptic).  A wide incision will be made around your nipple. The skin and nipple inside the incision will be removed along with all breast tissue.  If you are having a modified radical mastectomy:  The lining over your chest muscles will be removed.  The incision may be extended to reach the lymph nodes under your arm, or a second incision may be made.  The lymph nodes will be removed.  You may have a drainage tube inserted into your incision to collect fluid that builds up after surgery. This tube is connected to a suction bulb.  Your incision or incisions will be closed with stitches (sutures).  A bandage (dressing) will be placed over your breast and under your arm. The procedure may vary among health care providers and hospitals. AFTER THE PROCEDURE  You will be moved to a recovery area.  Your blood pressure, heart rate, breathing rate, and blood oxygen level will be monitored  often until the medicines you were given have worn off.  You will be given pain medicine as needed.  After a while, you will be taken to a hospital room.  You will be encouraged to get up and walk as soon as you can.  Your IV tube can be removed when you are able to eat and drink.  Your drain may be removed before you go home from the hospital, or you may  be sent home with your drain and suction bulb.   This information is not intended to replace advice given to you by your health care provider. Make sure you discuss any questions you have with your health care provider.   Document Released: 02/06/2001 Document Revised: 06/04/2014 Document Reviewed: 01/27/2014 Elsevier Interactive Patient Education [employer].

## 2016-05-17 ENCOUNTER — Ambulatory Visit
Admission: RE | Admit: 2016-05-17 | Discharge: 2016-05-17 | Disposition: A | Discharge status: Home / Self Care | Payer: Commercial Managed Care - HMO | Source: Ambulatory Visit | Attending: Surgery | Admitting: Surgery

## 2016-05-17 ENCOUNTER — Encounter: Payer: Self-pay | Admitting: Diagnostic Radiology

## 2016-05-17 ENCOUNTER — Encounter
Admission: RE | Admit: 2016-05-17 | Discharge: 2016-05-17 | Disposition: A | Discharge status: Home / Self Care | Payer: Commercial Managed Care - HMO | Source: Ambulatory Visit | Attending: Surgery | Admitting: Surgery

## 2016-05-17 ENCOUNTER — Telehealth: Payer: Self-pay | Admitting: Surgery

## 2016-05-17 ENCOUNTER — Telehealth: Payer: Self-pay

## 2016-05-17 DIAGNOSIS — C50919 Malignant neoplasm of unspecified site of unspecified female breast: Secondary | ICD-10-CM | POA: Insufficient documentation

## 2016-05-17 DIAGNOSIS — R9431 Abnormal electrocardiogram [ECG] [EKG]: Secondary | ICD-10-CM

## 2016-05-17 LAB — CBC WITH DIFFERENTIAL/PLATELET
Basophils Absolute: 0 10*3/uL (ref 0–0.1)
Basophils Relative: 0 %
EOS ABS: 0.1 10*3/uL (ref 0–0.7)
EOS PCT: 1 %
HCT: 41.7 % (ref 35.0–47.0)
Hemoglobin: 13.9 g/dL (ref 12.0–16.0)
LYMPHS ABS: 2.4 10*3/uL (ref 1.0–3.6)
Lymphocytes Relative: 26 %
MCH: 31 pg (ref 26.0–34.0)
MCHC: 33.3 g/dL (ref 32.0–36.0)
MCV: 93.2 fL (ref 80.0–100.0)
MONOS PCT: 7 %
Monocytes Absolute: 0.6 10*3/uL (ref 0.2–0.9)
Neutro Abs: 5.9 10*3/uL (ref 1.4–6.5)
Neutrophils Relative %: 66 %
PLATELETS: 237 10*3/uL (ref 150–440)
RBC: 4.48 MIL/uL (ref 3.80–5.20)
RDW: 14.3 % (ref 11.5–14.5)
WBC: 9.1 10*3/uL (ref 3.6–11.0)

## 2016-05-17 LAB — COMPREHENSIVE METABOLIC PANEL
ALK PHOS: 73 U/L (ref 38–126)
ALT: 21 U/L (ref 14–54)
ANION GAP: 8 (ref 5–15)
AST: 21 U/L (ref 15–41)
Albumin: 4.2 g/dL (ref 3.5–5.0)
BUN: 19 mg/dL (ref 6–20)
CALCIUM: 9.7 mg/dL (ref 8.9–10.3)
CO2: 28 mmol/L (ref 22–32)
Chloride: 104 mmol/L (ref 101–111)
Creatinine, Ser: 0.87 mg/dL (ref 0.44–1.00)
Glucose, Bld: 93 mg/dL (ref 65–99)
Potassium: 3.8 mmol/L (ref 3.5–5.1)
SODIUM: 140 mmol/L (ref 135–145)
Total Bilirubin: 0.1 mg/dL — ABNORMAL LOW (ref 0.3–1.2)
Total Protein: 8.2 g/dL — ABNORMAL HIGH (ref 6.5–8.1)

## 2016-05-17 NOTE — Pre-Procedure Instructions (Signed)
1600 Dr. Daren Doswell Crazier to proceed with EKG showing no significant changes since 2014

## 2016-05-17 NOTE — Telephone Encounter (Signed)
Reviewed Pre-op testing on patient. All labs and Chest X-ray are great. EKG has abnormality.   Call made to patient at this time. All results reviewed. She does not have an established cardiologist and has no preference.  Call made to [facility] and spoke with Sabrina at this time. She had Dr. Fletcher Anon look at EKG and he states that this is due to lead misplacement and is requesting a new EKG. Order placed.   Call was made to patient. She will go tomorrow morning to have this EKG repeated.

## 2016-05-17 NOTE — Telephone Encounter (Signed)
Spoke with Rosann Auerbach in [place] and discussed plan on patient.   I will call back to pre-admit as soon as results on new EKG are in.

## 2016-05-17 NOTE — Patient Instructions (Signed)
  Your procedure is scheduled [place]:[id number]/24/16 Report to Radiology.   Remember: Instructions that are not followed completely may result in serious medical risk, up to and including death, or upon the discretion of your surgeon and anesthesiologist your surgery may need to be rescheduled.    _x___ 1. Do not eat food or drink liquids after midnight. No gum chewing or hard candies.     __x__ 2. No Alcohol for 24 hours before or after surgery.   ____ 3. Do Not Smoke For 24 Hours Prior to Your Surgery.   ____ 4. Bring all medications with you on the day of surgery if instructed.    _x___ 5. Notify your doctor if there is any change in your medical condition     (cold, fever, infections).       Do not wear jewelry, make-up, hairpins, clips or nail polish.  Do not wear lotions, powders, or perfumes. You may wear deodorant.  Do not shave 48 hours prior to surgery. Men may shave face and neck.  Do not bring valuables to the hospital.    [facility] is not responsible for any belongings or valuables.               Contacts, dentures or bridgework may not be worn into surgery.  Leave your suitcase in the car. After surgery it may be brought to your room.  For patients admitted to the hospital, discharge time is determined by your                treatment team.   Patients discharged the day of surgery will not be allowed to drive home.   Please read over the following fact sheets that you were given:      ____ Take these medicines the morning of surgery with A SIP OF WATER:    1.none  2.   3.   4.  5.  6.  ____ Fleet Enema (as directed)   __x__ Use CHG Soap as directed  ____ Use inhalers on the day of surgery  ____ Stop metformin 2 days prior to surgery    ____ Take 1/2 of usual insulin dose the night before surgery and none on the morning of surgery.   ____ Stop Coumadin/Plavix/aspirin on   __x__ Stop Anti-inflammatories on on today   ____ Stop supplements until after  surgery.    ____ Bring C-Pap to the hospital.

## 2016-05-17 NOTE — Telephone Encounter (Signed)
Pt advised of pre op date/time and sx date. Sx: 05/24/16 with Dr Maren Reamer Mastectomy with sentinel node injection.  Pre op: 05/17/16 @ 1:00opm--office.   Patient made aware to arrive at [facility] at 8:00am the day of surgery. She will register and then go to radiology for sentinel node injection. From there she will be taken to same day surgery.

## 2016-05-18 ENCOUNTER — Ambulatory Visit: Payer: Self-pay | Admitting: Surgery

## 2016-05-18 ENCOUNTER — Ambulatory Visit
Admission: RE | Admit: 2016-05-18 | Discharge: 2016-05-18 | Disposition: A | Discharge status: Home / Self Care | Payer: Commercial Managed Care - HMO | Source: Ambulatory Visit | Attending: Surgery | Admitting: Surgery

## 2016-05-18 ENCOUNTER — Other Ambulatory Visit: Payer: Self-pay

## 2016-05-18 ENCOUNTER — Telehealth: Payer: Self-pay | Admitting: Cardiovascular Disease

## 2016-05-18 DIAGNOSIS — R9431 Abnormal electrocardiogram [ECG] [EKG]: Secondary | ICD-10-CM | POA: Diagnosis not present

## 2016-05-18 DIAGNOSIS — C50411 Malignant neoplasm of upper-outer quadrant of right female breast: Secondary | ICD-10-CM | POA: Diagnosis not present

## 2016-05-18 DIAGNOSIS — I252 Old myocardial infarction: Secondary | ICD-10-CM | POA: Insufficient documentation

## 2016-05-18 NOTE — Telephone Encounter (Signed)
Obtained new EKG from today in Cardiopulmonary. Call made to Dr. Tyrell Antonio office at this time to see if Cardiology clearance will be needed. Awaiting a call back from Dr. Tyrell Antonio nurse at this time.

## 2016-05-18 NOTE — Telephone Encounter (Signed)
Amber with [facility] called and did another EKG on this patient with new machine. Can Dr. Fletcher Anon take a look at this to see if this patient needs cardiac clearance. Thanks!!

## 2016-05-18 NOTE — Telephone Encounter (Signed)
Spoke with Dr. Tyrell Antonio nurse, Ivin Booty at this time who states that Dr. Fletcher Anon has looked at EKG, compared it to the one in 2014 and says that she does NOT need cardiac clearance.  Spoke with patient and explained that she does not need to have cardiac clearance. We will proceed with plan as scheduled. Dr. Azalee Course notified.

## 2016-05-18 NOTE — Telephone Encounter (Signed)
Dr. Fletcher Anon reviewed EKG and states it looks fine; as one in 2014. Notified Psychologist, sport and exercise.

## 2016-05-23 DIAGNOSIS — H018 Other specified inflammations of eyelid: Secondary | ICD-10-CM | POA: Diagnosis not present

## 2016-05-23 DIAGNOSIS — H02059 Trichiasis without entropian unspecified eye, unspecified eyelid: Secondary | ICD-10-CM | POA: Diagnosis not present

## 2016-05-23 MED ORDER — CEFAZOLIN SODIUM-DEXTROSE 2-4 GM/100ML-% IV SOLN
2.0000 g | INTRAVENOUS | Status: AC
  Administered 2016-05-24: 2 g via INTRAVENOUS

## 2016-05-24 ENCOUNTER — Encounter: Payer: Self-pay | Admitting: *Deleted

## 2016-05-24 ENCOUNTER — Inpatient Hospital Stay: Payer: Commercial Managed Care - HMO | Admitting: Anesthesiology

## 2016-05-24 ENCOUNTER — Ambulatory Visit
Admission: RE | Admit: 2016-05-24 | Discharge: 2016-05-24 | Disposition: A | Discharge status: Home / Self Care | Payer: Commercial Managed Care - HMO | Source: Ambulatory Visit | Attending: Surgery | Admitting: Surgery

## 2016-05-24 ENCOUNTER — Observation Stay
Admission: RE | Admit: 2016-05-24 | Discharge: 2016-05-25 | Disposition: A | Discharge status: Home / Self Care | Payer: Commercial Managed Care - HMO | Source: Ambulatory Visit | Attending: Surgery | Admitting: Surgery

## 2016-05-24 ENCOUNTER — Encounter
Admission: RE | Disposition: A | Discharge status: Home / Self Care | Payer: Self-pay | Source: Ambulatory Visit | Attending: Surgery

## 2016-05-24 DIAGNOSIS — F419 Anxiety disorder, unspecified: Secondary | ICD-10-CM | POA: Diagnosis not present

## 2016-05-24 DIAGNOSIS — F418 Other specified anxiety disorders: Secondary | ICD-10-CM | POA: Diagnosis not present

## 2016-05-24 DIAGNOSIS — C50811 Malignant neoplasm of overlapping sites of right female breast: Secondary | ICD-10-CM | POA: Diagnosis not present

## 2016-05-24 DIAGNOSIS — Z87891 Personal history of nicotine dependence: Secondary | ICD-10-CM | POA: Insufficient documentation

## 2016-05-24 DIAGNOSIS — M1990 Unspecified osteoarthritis, unspecified site: Secondary | ICD-10-CM | POA: Insufficient documentation

## 2016-05-24 DIAGNOSIS — N6011 Diffuse cystic mastopathy of right breast: Secondary | ICD-10-CM | POA: Diagnosis not present

## 2016-05-24 DIAGNOSIS — Z886 Allergy status to analgesic agent status: Secondary | ICD-10-CM | POA: Insufficient documentation

## 2016-05-24 DIAGNOSIS — C50911 Malignant neoplasm of unspecified site of right female breast: Secondary | ICD-10-CM

## 2016-05-24 DIAGNOSIS — C50411 Malignant neoplasm of upper-outer quadrant of right female breast: Secondary | ICD-10-CM | POA: Diagnosis not present

## 2016-05-24 DIAGNOSIS — F329 Major depressive disorder, single episode, unspecified: Secondary | ICD-10-CM | POA: Insufficient documentation

## 2016-05-24 DIAGNOSIS — Z170 Estrogen receptor positive status [ER+]: Secondary | ICD-10-CM | POA: Diagnosis not present

## 2016-05-24 HISTORY — PX: MASTECTOMY W/ SENTINEL NODE BIOPSY: SHX2001

## 2016-05-24 SURGERY — MASTECTOMY WITH SENTINEL LYMPH NODE BIOPSY
Anesthesia: General | Laterality: Right | Wound class: Clean

## 2016-05-24 MED ORDER — ONDANSETRON HCL 4 MG/2ML IJ SOLN: 4.0000 mg | Freq: Four times a day (QID) | INTRAMUSCULAR | Status: DC | PRN

## 2016-05-24 MED ORDER — CEFAZOLIN SODIUM-DEXTROSE 2-4 GM/100ML-% IV SOLN
INTRAVENOUS | Status: AC
  Filled 2016-05-24: qty 100

## 2016-05-24 MED ORDER — METHYLENE BLUE 0.5 % INJ SOLN
INTRAVENOUS | Status: AC
  Filled 2016-05-24: qty 10

## 2016-05-24 MED ORDER — LACTATED RINGERS IV SOLN
INTRAVENOUS | Status: DC
  Administered 2016-05-24: 1000 mL via INTRAVENOUS
  Administered 2016-05-24: 10:00:00 via INTRAVENOUS

## 2016-05-24 MED ORDER — FENTANYL CITRATE (PF) 100 MCG/2ML IJ SOLN
INTRAMUSCULAR | Status: DC | PRN
  Administered 2016-05-24: 50 ug via INTRAVENOUS
  Administered 2016-05-24 (×2): 100 ug via INTRAVENOUS
  Administered 2016-05-24 (×2): 25 ug via INTRAVENOUS

## 2016-05-24 MED ORDER — ONDANSETRON HCL 4 MG/2ML IJ SOLN
INTRAMUSCULAR | Status: DC | PRN
  Administered 2016-05-24: 4 mg via INTRAVENOUS

## 2016-05-24 MED ORDER — SODIUM CHLORIDE 0.9 % IV SOLN
INTRAVENOUS | Status: DC | PRN
  Administered 2016-05-24: 70 mL

## 2016-05-24 MED ORDER — FENTANYL CITRATE (PF) 100 MCG/2ML IJ SOLN
INTRAMUSCULAR | Status: AC
Start: 2016-05-24 — End: 2016-05-24
  Filled 2016-05-24: qty 2

## 2016-05-24 MED ORDER — FENTANYL CITRATE (PF) 100 MCG/2ML IJ SOLN
25.0000 ug | INTRAMUSCULAR | Status: DC | PRN
  Administered 2016-05-24: 25 ug via INTRAVENOUS

## 2016-05-24 MED ORDER — ROCURONIUM BROMIDE 50 MG/5ML IV SOSY
PREFILLED_SYRINGE | INTRAVENOUS | Status: AC
  Filled 2016-05-24: qty 5

## 2016-05-24 MED ORDER — ACETAMINOPHEN 10 MG/ML IV SOLN
INTRAVENOUS | Status: AC
  Filled 2016-05-24: qty 100

## 2016-05-24 MED ORDER — SUGAMMADEX SODIUM 200 MG/2ML IV SOLN
INTRAVENOUS | Status: AC
  Filled 2016-05-24: qty 2

## 2016-05-24 MED ORDER — PROMETHAZINE HCL 25 MG/ML IJ SOLN
INTRAMUSCULAR | Status: AC
  Filled 2016-05-24: qty 1

## 2016-05-24 MED ORDER — BUPIVACAINE HCL (PF) 0.5 % IJ SOLN
INTRAMUSCULAR | Status: AC
  Filled 2016-05-24: qty 30

## 2016-05-24 MED ORDER — PROPOFOL 10 MG/ML IV BOLUS
INTRAVENOUS | Status: DC | PRN
  Administered 2016-05-24: 120 mg via INTRAVENOUS
  Administered 2016-05-24: 80 mg via INTRAVENOUS

## 2016-05-24 MED ORDER — FENTANYL CITRATE (PF) 100 MCG/2ML IJ SOLN
INTRAMUSCULAR | Status: AC
  Filled 2016-05-24: qty 2

## 2016-05-24 MED ORDER — ACETAMINOPHEN 10 MG/ML IV SOLN
INTRAVENOUS | Status: DC | PRN
  Administered 2016-05-24: 1000 mg via INTRAVENOUS

## 2016-05-24 MED ORDER — CYCLOBENZAPRINE HCL 10 MG PO TABS
5.0000 mg | ORAL_TABLET | Freq: Three times a day (TID) | ORAL | Status: DC
  Administered 2016-05-24 (×2): 5 mg via ORAL
  Filled 2016-05-24 (×3): qty 1

## 2016-05-24 MED ORDER — CHLORHEXIDINE GLUCONATE CLOTH 2 % EX PADS: 6.0000 | MEDICATED_PAD | Freq: Once | CUTANEOUS | Status: DC

## 2016-05-24 MED ORDER — SODIUM CHLORIDE 0.9 % IJ SOLN
INTRAMUSCULAR | Status: AC
  Filled 2016-05-24: qty 50

## 2016-05-24 MED ORDER — MEPERIDINE HCL 25 MG/ML IJ SOLN: 6.2500 mg | INTRAMUSCULAR | Status: DC | PRN

## 2016-05-24 MED ORDER — SODIUM CHLORIDE FLUSH 0.9 % IV SOLN
INTRAVENOUS | Status: AC
  Filled 2016-05-24: qty 3

## 2016-05-24 MED ORDER — METHYLENE BLUE 0.5 % INJ SOLN
INTRAVENOUS | Status: DC | PRN
  Administered 2016-05-24: 2 mL

## 2016-05-24 MED ORDER — MIDAZOLAM HCL 2 MG/2ML IJ SOLN
INTRAMUSCULAR | Status: DC | PRN
  Administered 2016-05-24: 2 mg via INTRAVENOUS

## 2016-05-24 MED ORDER — TRAMADOL HCL 50 MG PO TABS: 50.0000 mg | ORAL_TABLET | Freq: Four times a day (QID) | ORAL | Status: DC | PRN

## 2016-05-24 MED ORDER — FAMOTIDINE 20 MG PO TABS
ORAL_TABLET | ORAL | Status: AC
  Filled 2016-05-24: qty 1

## 2016-05-24 MED ORDER — ACETAMINOPHEN 325 MG PO TABS
650.0000 mg | ORAL_TABLET | Freq: Four times a day (QID) | ORAL | Status: DC
  Administered 2016-05-24 – 2016-05-25 (×4): 650 mg via ORAL
  Filled 2016-05-24 (×4): qty 2

## 2016-05-24 MED ORDER — ONDANSETRON 4 MG PO TBDP: 4.0000 mg | ORAL_TABLET | Freq: Four times a day (QID) | ORAL | Status: DC | PRN

## 2016-05-24 MED ORDER — LACTATED RINGERS IV SOLN
INTRAVENOUS | Status: DC | PRN
  Administered 2016-05-24: 11:00:00 via INTRAVENOUS

## 2016-05-24 MED ORDER — LIDOCAINE HCL (CARDIAC) 20 MG/ML IV SOLN
INTRAVENOUS | Status: DC | PRN
  Administered 2016-05-24: 60 mg via INTRAVENOUS
  Administered 2016-05-24: 40 mg via INTRAVENOUS

## 2016-05-24 MED ORDER — OXYCODONE HCL 5 MG PO TABS: 5.0000 mg | ORAL_TABLET | Freq: Once | ORAL | Status: DC | PRN

## 2016-05-24 MED ORDER — SODIUM CHLORIDE 0.9 % IJ SOLN
INTRAMUSCULAR | Status: AC
  Filled 2016-05-24: qty 10

## 2016-05-24 MED ORDER — OXYCODONE HCL 5 MG/5ML PO SOLN: 5.0000 mg | Freq: Once | ORAL | Status: DC | PRN

## 2016-05-24 MED ORDER — PROPOFOL 10 MG/ML IV BOLUS
INTRAVENOUS | Status: AC
  Filled 2016-05-24: qty 20

## 2016-05-24 MED ORDER — DEXAMETHASONE SODIUM PHOSPHATE 10 MG/ML IJ SOLN
INTRAMUSCULAR | Status: DC | PRN
  Administered 2016-05-24: 10 mg via INTRAVENOUS

## 2016-05-24 MED ORDER — PROMETHAZINE HCL 25 MG/ML IJ SOLN
6.2500 mg | INTRAMUSCULAR | Status: DC | PRN
  Administered 2016-05-24: 12.5 mg via INTRAVENOUS

## 2016-05-24 MED ORDER — MORPHINE SULFATE (PF) 4 MG/ML IV SOLN
2.0000 mg | INTRAVENOUS | Status: DC | PRN
Start: 2016-05-24 — End: 2016-05-25

## 2016-05-24 MED ORDER — BUPIVACAINE HCL (PF) 0.5 % IJ SOLN
INTRAMUSCULAR | Status: DC | PRN
  Administered 2016-05-24: 30 mL

## 2016-05-24 MED ORDER — KETOROLAC TROMETHAMINE 15 MG/ML IJ SOLN
15.0000 mg | Freq: Four times a day (QID) | INTRAMUSCULAR | Status: DC
  Administered 2016-05-24 – 2016-05-25 (×3): 15 mg via INTRAVENOUS
  Filled 2016-05-24 (×3): qty 1

## 2016-05-24 MED ORDER — DEXTROSE-NACL 5-0.9 % IV SOLN
INTRAVENOUS | Status: DC
  Administered 2016-05-24 – 2016-05-25 (×2): via INTRAVENOUS

## 2016-05-24 MED ORDER — MIDAZOLAM HCL 2 MG/2ML IJ SOLN
INTRAMUSCULAR | Status: AC
  Filled 2016-05-24: qty 2

## 2016-05-24 MED ORDER — TECHNETIUM TC 99M SULFUR COLLOID
1.0000 | Freq: Once | INTRAVENOUS | Status: AC | PRN
  Administered 2016-05-24: 0.974 via INTRAVENOUS

## 2016-05-24 MED ORDER — HYDRALAZINE HCL 20 MG/ML IJ SOLN: 10.0000 mg | INTRAMUSCULAR | Status: DC | PRN

## 2016-05-24 MED ORDER — FAMOTIDINE 20 MG PO TABS
20.0000 mg | ORAL_TABLET | Freq: Once | ORAL | Status: AC
  Administered 2016-05-24: 20 mg via ORAL

## 2016-05-24 MED ORDER — LIDOCAINE 2% (20 MG/ML) 5 ML SYRINGE
INTRAMUSCULAR | Status: AC
  Filled 2016-05-24: qty 5

## 2016-05-24 MED ORDER — BUPIVACAINE LIPOSOME 1.3 % IJ SUSP
INTRAMUSCULAR | Status: AC
  Filled 2016-05-24: qty 20

## 2016-05-24 MED ORDER — ONDANSETRON HCL 4 MG/2ML IJ SOLN
INTRAMUSCULAR | Status: AC
  Filled 2016-05-24: qty 2

## 2016-05-24 MED ORDER — SUCCINYLCHOLINE CHLORIDE 20 MG/ML IJ SOLN
INTRAMUSCULAR | Status: DC | PRN
  Administered 2016-05-24: 100 mg via INTRAVENOUS

## 2016-05-24 MED ORDER — DEXAMETHASONE SODIUM PHOSPHATE 10 MG/ML IJ SOLN
INTRAMUSCULAR | Status: AC
  Filled 2016-05-24: qty 1

## 2016-05-24 SURGICAL SUPPLY — 37 items
BULB RESERV EVAC DRAIN JP 100C (MISCELLANEOUS) ×6 IMPLANT
CANISTER SUCT 1200ML W/VALVE (MISCELLANEOUS) ×3 IMPLANT
CHLORAPREP W/TINT 26ML (MISCELLANEOUS) ×3 IMPLANT
CNTNR SPEC 2.5X3XGRAD LEK (MISCELLANEOUS)
CONT SPEC 4OZ STER OR WHT (MISCELLANEOUS)
CONTAINER SPEC 2.5X3XGRAD LEK (MISCELLANEOUS) IMPLANT
DRAIN CHANNEL JP 19F (MISCELLANEOUS) ×6 IMPLANT
DRAPE LAPAROTOMY TRNSV 106X77 (MISCELLANEOUS) ×3 IMPLANT
ELECT CAUTERY BLADE TIP 2.5 (TIP) ×3
ELECT REM PT RETURN 9FT ADLT (ELECTROSURGICAL) ×3
ELECTRODE CAUTERY BLDE TIP 2.5 (TIP) ×1 IMPLANT
ELECTRODE REM PT RTRN 9FT ADLT (ELECTROSURGICAL) ×1 IMPLANT
GAUZE FLUFF 18X24 1PLY STRL (GAUZE/BANDAGES/DRESSINGS) ×3 IMPLANT
GLOVE PI ORTHOPRO 6.5 (GLOVE) ×2
GLOVE PI ORTHOPRO STRL 6.5 (GLOVE) ×1 IMPLANT
GOWN STRL REUS W/ TWL LRG LVL3 (GOWN DISPOSABLE) ×2 IMPLANT
GOWN STRL REUS W/TWL LRG LVL3 (GOWN DISPOSABLE) ×4
HANDLE YANKAUER SUCT BULB TIP (MISCELLANEOUS) ×3 IMPLANT
LIQUID BAND (GAUZE/BANDAGES/DRESSINGS) IMPLANT
NDL SAFETY 25GX1.5 (NEEDLE) ×3 IMPLANT
NEEDLE FILTER BLUNT 18X 1/2SAF (NEEDLE)
NEEDLE FILTER BLUNT 18X1 1/2 (NEEDLE) IMPLANT
PACK BASIN MAJOR ARMC (MISCELLANEOUS) ×3 IMPLANT
SLEVE PROBE SENORX GAMMA FIND (MISCELLANEOUS) ×3 IMPLANT
SURGI-BRA LG (MISCELLANEOUS) IMPLANT
SUT ETHILON 3-0 FS-10 30 BLK (SUTURE)
SUT MNCRL 3-0 UNDYED SH (SUTURE) ×2 IMPLANT
SUT MNCRL 4-0 (SUTURE) ×4
SUT MNCRL 4-0 27XMFL (SUTURE) ×2
SUT MONOCRYL 3-0 UNDYED (SUTURE) ×4
SUT SILK 3-0 (SUTURE) ×3 IMPLANT
SUTURE EHLN 3-0 FS-10 30 BLK (SUTURE) IMPLANT
SUTURE MNCRL 4-0 27XMF (SUTURE) ×2 IMPLANT
SYRINGE 10CC LL (SYRINGE) ×3 IMPLANT
TUBING CONNECTING 10 (TUBING) ×2 IMPLANT
TUBING CONNECTING 10' (TUBING) ×1
WATER STERILE IRR 1000ML POUR (IV SOLUTION) ×3 IMPLANT

## 2016-05-24 NOTE — Anesthesia Preprocedure Evaluation (Signed)
Anesthesia Evaluation  Patient identified by MRN, date of birth, ID band Patient awake    Reviewed: Allergy & Precautions, NPO status , Patient's Chart, lab work & pertinent test results  History of Anesthesia Complications (+) PONV and history of anesthetic complications  Airway Mallampati: II  TM Distance: >3 FB Neck ROM: Full    Dental  (+) Partial Lower, Partial Upper   Pulmonary neg sleep apnea, neg COPD, former smoker,    breath sounds clear to auscultation- rhonchi (-) wheezing      Cardiovascular Exercise Tolerance: Good (-) hypertension(-) CAD and (-) Past MI  Rhythm:Regular Rate:Normal - Systolic murmurs and - Diastolic murmurs    Neuro/Psych Anxiety Depression negative neurological ROS     GI/Hepatic negative GI ROS, Neg liver ROS,   Endo/Other  negative endocrine ROSneg diabetes  Renal/GU negative Renal ROS     Musculoskeletal  (+) Arthritis ,   Abdominal (+) - obese,   Peds  Hematology negative hematology ROS (+)   Anesthesia Other Findings Past Medical History: No date: Anxiety No date: Arthritis 2014-2015: Breast cancer ([place])     Comment: left breast cancer No date: Complication of anesthesia     Comment: post nausea and vomiting No date: Depression No date: Numbness and tingling   Reproductive/Obstetrics                             Anesthesia Physical Anesthesia Plan  ASA: III  Anesthesia Plan: General   Post-op Pain Management:    Induction: Intravenous  Airway Management Planned: Oral ETT  Additional Equipment:   Intra-op Plan:   Post-operative Plan: Extubation in OR  Informed Consent: I have reviewed the patients History and Physical, chart, labs and discussed the procedure including the risks, benefits and alternatives for the proposed anesthesia with the patient or authorized representative who has indicated his/her understanding and acceptance.    Dental advisory given  Plan Discussed with: CRNA and Anesthesiologist  Anesthesia Plan Comments:         Anesthesia Quick Evaluation

## 2016-05-24 NOTE — Transfer of Care (Signed)
Immediate Anesthesia Transfer of Care Note  Patient: Kristin Valdez  Procedure(s) Performed: Procedure(s): MASTECTOMY WITH SENTINEL LYMPH NODE BIOPSY (Right)  Patient Location: PACU  Anesthesia Type:General  Level of Consciousness: awake, alert , oriented and patient cooperative  Airway & Oxygen Therapy: Patient Spontanous Breathing and Patient connected to nasal cannula oxygen  Post-op Assessment: Report given to RN and Post -op Vital signs reviewed and stable  Post vital signs: Reviewed and stable  Last Vitals:  Vitals:   05/24/16 0910 05/24/16 1330  BP: 136/73   Pulse: 84   Resp: 20   Temp: 37.1 C (!) 36.1 C    Last Pain:  Vitals:   05/24/16 1330  TempSrc: Temporal  PainSc: Asleep      Patients Stated Pain Goal: 2 ([id number])  Complications: No apparent anesthesia complications

## 2016-05-24 NOTE — Interval H&P Note (Signed)
History and Physical Interval Note:  05/24/2016 10:38 AM  Kristin Valdez  has presented today for surgery, with the diagnosis of Right breast cancer  The various methods of treatment have been discussed with the patient and family. After consideration of risks, benefits and other options for treatment, the patient has consented to  Procedure(s): MASTECTOMY WITH SENTINEL LYMPH NODE BIOPSY (Right) as a surgical intervention .  The patient's history has been reviewed, patient examined, no change in status, stable for surgery.  I have reviewed the patient's chart and labs.  Questions were answered to the patient's satisfaction.     Jhoselyn Ruffini L Allysa Governale

## 2016-05-24 NOTE — Anesthesia Procedure Notes (Signed)
Procedure Name: Intubation Date/Time: 05/24/2016 10:52 AM Performed by: Darlyne Russian Pre-anesthesia Checklist: Patient identified, Emergency Drugs available, Suction available, Patient being monitored and Timeout performed Patient Re-evaluated:Patient Re-evaluated prior to inductionOxygen Delivery Method: Circle system utilized Preoxygenation: Pre-oxygenation with 100% oxygen Intubation Type: IV induction Laryngoscope Size: Mac and 3 Grade View: Grade III Tube type: Oral Tube size: 7.0 mm Number of attempts: 1 Airway Equipment and Method: Stylet Placement Confirmation: ETT inserted through vocal cords under direct vision,  positive ETCO2 and breath sounds checked- equal and bilateral Secured at: 21 cm Tube secured with: Tape Dental Injury: Injury to lip and Teeth and Oropharynx as per pre-operative assessment  Comments: Cut noted to upper left side of lip.  vaseline gauze applied to cut.

## 2016-05-24 NOTE — H&P (View-Only) (Signed)
Subjective:     Patient ID: Kristin Valdez, female   DOB: 12/25/1935, 80 y.o.   MRN: [id number]  HPI  80 yr old female with right breast invasive mammary carcinoma..She is well-known to the surgery service and had a left breast mastectomy after having left breast cancer this triple positive for ER/PR and HER-2 back in 2014. The patient at that time attempted to take aromatase inhibitors due to the symptoms only have them for 3-4 weeks and then stopped and refused to take any chemotherapy for the HER-2 positivity at that time. The patient noticed a small mass in her right breast and was concerned about it and was seen by her primary care physician. The patient had a regular mammogram in noticed an area of suspicion this was biopsied and found to be invasive mammary carcinoma which appears to be evident different histology with ER positive PR negative and HER-2 equivocal this time with features of pleomorphic lobular carcinoma.  She started her menses at age 15 and then had a hysterectomy in 1981 and has not had a period since. The patient did take Premarin for about 5 years but otherwise no other birth control or hormone replacement therapy. She did have a daughter at the age of 25 and she did not breast-feed. She has a personal history of breast cancer as discussed previously and a father who have prostate cancer but no other family members with cancer. She is quite frustrated at this time but is otherwise healthy and takes no medications.   Past Medical History:  Diagnosis Date  . Anxiety   . Arthritis   . Breast cancer ([facility]) 2014-2015   left breast cancer  . Depression   . Numbness and tingling    Past Surgical History:  Procedure Laterality Date  . BREAST BIOPSY Left    Stereotactic biopsy  . BREAST BIOPSY Right 05/02/2016   US guided biopsy  . MASTECTOMY Left 2015   Family History  Problem Relation Age of Onset  . Heart disease Mother   . Prostate cancer Father   . Stroke Brother   .  Healthy Daughter    Social History   Social History  . Marital status: Widowed    Spouse name: N/A  . Number of children: N/A  . Years of education: N/A   Social History Main Topics  . Smoking status: Former Smoker    Quit date: 01/09/1971  . Smokeless tobacco: Never Used  . Alcohol use Yes     Comment: Occasional  . Drug use: No  . Sexual activity: Not Asked   Other Topics Concern  . None   Social History Narrative  . None   No current outpatient prescriptions on file. Allergies  Allergen Reactions  . Excedrin Extra Strength [Asa-Apap-Caff Buffered] Other (See Comments)    Shaking     Review of Systems  Constitutional: Negative for activity change, appetite change, chills, fatigue, fever and unexpected weight change.  HENT: Negative for congestion and sore throat.   Respiratory: Negative for apnea, cough, choking, chest tightness, shortness of breath and stridor.   Cardiovascular: Negative for chest pain, palpitations and leg swelling.  Gastrointestinal: Negative for abdominal pain, constipation, diarrhea, nausea and rectal pain.  Genitourinary: Negative for dysuria and hematuria.  Musculoskeletal: Negative for back pain and joint swelling.  Skin: Negative for color change, pallor, rash and wound.  Neurological: Negative for dizziness and weakness.  Hematological: Negative for adenopathy. Does not bruise/bleed easily.  Psychiatric/Behavioral: Negative for agitation.   The patient is not nervous/anxious.   All other systems reviewed and are negative.      Vitals:   05/16/16 1107  BP: (!) 149/88  Pulse: (!) 102  Temp: 98.2 F (36.8 C)    Objective:   Physical Exam  Constitutional: She is oriented to person, place, and time. She appears well-developed and well-nourished. No distress.  HENT:  Head: Normocephalic and atraumatic.  Right Ear: External ear normal.  Left Ear: External ear normal.  Nose: Nose normal.  Mouth/Throat: Oropharynx is clear and moist.  No oropharyngeal exudate.  Eyes: Conjunctivae and EOM are normal. Pupils are equal, round, and reactive to light. No scleral icterus.  Neck: Normal range of motion. Neck supple. No tracheal deviation present.  Cardiovascular: Normal rate, regular rhythm, normal heart sounds and intact distal pulses.  Exam reveals no gallop and no friction rub.   No murmur heard. Pulmonary/Chest: Effort normal and breath sounds normal. No respiratory distress. She has no wheezes. She has no rales.  Right breast: Some bruising in the upper part of the breast otherwise no skin changes no dimpling no nipple discharge or retraction, small less than 1 cm palpable mass in the 12:00 position approximately 2 cm from the nipple, no other masses palpated and no lymphadenopathy in the axilla  Left breast: Well-healed mastectomy site without any masses palpable underneath and no lymphadenopathy in the axilla  Abdominal: Bowel sounds are normal. She exhibits no distension. There is no tenderness. There is no rebound and no guarding.  Musculoskeletal: Normal range of motion. She exhibits no edema, tenderness or deformity.  Neurological: She is oriented to person, place, and time. She has normal reflexes. No cranial nerve deficit.  Skin: Skin is dry. No rash noted. No erythema. No pallor.  Psychiatric: She has a normal mood and affect. Her behavior is normal. Judgment and thought content normal.  Vitals reviewed.     Imaging:   IMPRESSION: Ultrasound guided biopsy of an indeterminate right breast mass at 1 o'clock, 2 cm from the nipple. No apparent complications.  FINDINGS: Mammographic images were obtained following ultrasound guided biopsy of a right breast mass at 1 o'clock, 2 cm from the nipple. Post biopsy mammogram demonstrates the HydroMARK shape 4 butterfly marker to be in the expected location within the upper, inner right breast. The mass seen within the upper, inner right breast on the patient's diagnostic  mammogram is obscured by post biopsy change.  Ultrasound of the upper-outer quadrant of the right breast demonstrates a small normal-appearing 5 mm lymph node at 9 o'clock, 7 cm from the nipple. In the right breast at 1 o'clock, 2 cm from the nipple there is a subtle irregular hypoechoic shadowing mass measuring approximately 8 x 5 x 5 mm. Ultrasound of the right axilla demonstrates multiple normal-appearing lymph nodes.  Pathology:  DIAGNOSIS:  A. BREAST, RIGHT 1:00, 2 CM FN; ULTRASOUND GUIDED BIOPSY:  - INVASIVE MAMMARY CARCINOMA WITH FEATURES SUGGESTIVE OF PLEOMORPHIC  LOBULAR CARCINOMA.  - SIZE: 10 MM.  - PRELIMINARY GRADE: 2 (NOTTINGHAM GRADE)    ARCHITECTURE GRADE: 3    NUCLEAR GRADE: 3    MITOTIC GRADE: 1  BREAST BIOMARKER TESTS  Estrogen Receptor (ER) Status: Positive, greater than 90%  Progesterone Receptor (PgR) Status: Negative, less than 1%  HER2 (by immunohistochemistry): Equivocal, 2+  Percentage of cells with uniform intense complete membrane staining: 2 %  HER2 (ERBB2) (by in situ hybridization): ordered, will be resulted in an  addendum   Assessment:       80 yr old female with right breast invasive mammary carcinoma    Plan:     I have personally reviewed her past medical history which is only positive for left breast cancer back in 2014 which was noted in my HPI,  she takes no other medications and has no other chronic diseases have personally reviewed her ultrasound and mammogram images which do show a suspicious area in the 12 to 1:00 position. I personally reviewed her pathology report which does show lobular carcinoma this time with ER positive PR negative and HER-2/neu equivocal.   I discussed the available options with the patient and daughter. The risk of recurrence is similar between mastectomy and lumpectomy with radiation.The patient is only interested in having a mastectomy which I think is a good option for her. I also discussed that we would  need to do a sentinel lymph node biopsy to check the nodes. Explained to the patient that after her surgical treatment she would also need an aromatase inhibitor since her previous tumor was ER/PR positive and her tumor this time is ER positive would still recommend using an aromatase inhibitor but can try different drug than she was on the first time.   I discussed risks of bleeding, infection, damage to surrounding tissues, having positive margins, needing further resection, damage to nerves causing arm numbness or difficulty raising arm, causing lymphoedema in the arm; as well as anesthesia risks of MI, stroke, prolonged ventilation, pulmonary embolism, thrombosis and even death. Patient and daughter were given the opportunity to ask questions and have them answered.The would like to proceed with Right mastectomy with sentinel lymph node biopsy.       

## 2016-05-24 NOTE — Progress Notes (Signed)
  Oncology Nurse Navigator Documentation  Navigator Location: [facility] (05/24/16 1400)   )        Surgery Date: 05/24/16 (05/24/16 1400)             Patient Visit Type: Surgery (05/24/16 1400)                              Time Spent with Patient: 60 (05/24/16 1400)  Supported patient  and daughter prior to surgery. Answered questions about HER2 results, which remain equivocal after FISH.

## 2016-05-24 NOTE — Op Note (Signed)
Simple Mastectomy with axillary sentinel lymph node biopsy Procedure Note  Indications: This patient presents for surgical treatment of Breast Cancer of the right breast  Pre-operative Diagnosis: right breast cancer  Post-operative Diagnosis: right breast cancer  Surgeon: Hubbard Robinson   Assistants: none  Anesthesia: General endotracheal anesthesia  ASA Class: 2  Procedure Details  The patient was seen again in the Holding Room. The risks, benefits, indications, potential complications, treatment options, and expected outcomes were discussed with the patient. The possibilities of reaction to medication, pulmonary aspiration, bleeding, recurrent infection, the need for additional procedures, failure to diagnose a condition, and creating a complication requiring transfusion or further operation were discussed with the patient. The patient and/or family concurred with the proposed plan, giving informed consent. The site of surgery was properly noted/marked. The patient was taken to the Operating Room, identified as Kristin Valdez and the procedure verified as right mastectomy with sentinel lymph node biopsy. A Time Out was held and the above information confirmed.  The patient was placed supine and general anesthetic was administered. Methylene blue dye was injected into the nipple areolar complex. The right arm, right breast, and chest were prepped and draped in standard fashion. An oblique elliptical incision was made encompassing the nipple. Skin flaps were created meticulously to preserve the subdermal blood supply to clavicle superiorly, sternum medially, latissimus dorsi laterally and intramammory fold inferiorly.  Dissection was carried down to the pectoralis fascia, which was included with the specimen.   Using a hand-held gamma probe, axillary sentinel nodes were identified. Dissection was carried through the clavipectoral fascia. Three axillary sentinel nodes were removed and submitted  to pathology. The long thoracic and thoracodorsal neurovascular bundles were identified and preserved. The remained of the breast tissue was removed and the specimen was marked and submitted to pathology.   The wound was irrigated. Two J-P drains were placed. One was placed within the lower flap and second was placed laterally in the axillary dissection region. Marcaine and long acting bupivacaine was injected into the subcutaneous tissues. The skin incision was closed in layers with a running 3-0 subcutaneous and 4-0 Monocryl subcuticular closure. The drains were secured with 3-0 silk sutures.  Sterile glue was applied and allowed to dry before placing a dry bulky gauze dressing and surgical bra.   Instrument, sponge, and needle counts were correct at closure and at the conclusion of the case.   Findings: three sentinel lymph nodes biopsied and sent to pathology, 2 JP drain placed  Estimated Blood Loss: less than 100 mL         Drains: Two JP drains placed as above         Total IV Fluids: 1081mL         Specimens: Right breast, sentinel lymph nodes #1, 2 and 3  Complications: None; patient tolerated the procedure well.         Disposition: PACU - hemodynamically stable.         Condition: stable

## 2016-05-25 ENCOUNTER — Encounter: Payer: Self-pay | Admitting: Surgery

## 2016-05-25 DIAGNOSIS — N6011 Diffuse cystic mastopathy of right breast: Secondary | ICD-10-CM | POA: Diagnosis not present

## 2016-05-25 DIAGNOSIS — Z886 Allergy status to analgesic agent status: Secondary | ICD-10-CM | POA: Diagnosis not present

## 2016-05-25 DIAGNOSIS — F329 Major depressive disorder, single episode, unspecified: Secondary | ICD-10-CM | POA: Diagnosis not present

## 2016-05-25 DIAGNOSIS — Z170 Estrogen receptor positive status [ER+]: Secondary | ICD-10-CM | POA: Diagnosis not present

## 2016-05-25 DIAGNOSIS — F419 Anxiety disorder, unspecified: Secondary | ICD-10-CM | POA: Diagnosis not present

## 2016-05-25 DIAGNOSIS — Z87891 Personal history of nicotine dependence: Secondary | ICD-10-CM | POA: Diagnosis not present

## 2016-05-25 DIAGNOSIS — M1990 Unspecified osteoarthritis, unspecified site: Secondary | ICD-10-CM | POA: Diagnosis not present

## 2016-05-25 DIAGNOSIS — C50411 Malignant neoplasm of upper-outer quadrant of right female breast: Secondary | ICD-10-CM | POA: Diagnosis not present

## 2016-05-25 LAB — SURGICAL PATHOLOGY

## 2016-05-25 MED ORDER — IBUPROFEN 800 MG PO TABS: 800.0000 mg | ORAL_TABLET | Freq: Three times a day (TID) | ORAL | 0 refills | Status: DC | PRN

## 2016-05-25 MED ORDER — TRAMADOL HCL 50 MG PO TABS: 50.0000 mg | ORAL_TABLET | Freq: Four times a day (QID) | ORAL | 0 refills | Status: DC | PRN

## 2016-05-25 MED ORDER — CYCLOBENZAPRINE HCL 5 MG PO TABS: 5.0000 mg | ORAL_TABLET | Freq: Three times a day (TID) | ORAL | 0 refills | Status: DC | PRN

## 2016-05-25 NOTE — Progress Notes (Signed)
Patient discharged to home IV site removed. Concerns addressed. Patient educated on JP drain emptying and logging. Prescriptions given to patient.

## 2016-05-25 NOTE — Anesthesia Postprocedure Evaluation (Signed)
Anesthesia Post Note  Patient: Kristin Valdez  Procedure(s) Performed: Procedure(s) (LRB): MASTECTOMY WITH SENTINEL LYMPH NODE BIOPSY (Right)  Patient location during evaluation: PACU Anesthesia Type: General Level of consciousness: awake and alert and oriented Pain management: pain level controlled Vital Signs Assessment: post-procedure vital signs reviewed and stable Respiratory status: spontaneous breathing Cardiovascular status: blood pressure returned to baseline Anesthetic complications: no     Last Vitals:  Vitals:   05/24/16 2048 05/25/16 0528  BP: 124/79 (!) 136/59  Pulse: 86 75  Resp: 17 18  Temp: 36.7 C 36.4 C    Last Pain:  Vitals:   05/25/16 0528  TempSrc: Oral  PainSc:                  Krista Som

## 2016-05-25 NOTE — Care Management Obs Status (Signed)
[place] NOTIFICATION   Patient Details  Name: MAXIENE TOBUREN MRN: [id number] Date of Birth: [birth year]   Medicare Observation Status Notification Given:  (S) No (observation less than 24 hours)    Beverly Sessions, RN 05/25/2016, 10:06 AM

## 2016-05-25 NOTE — Discharge Summary (Signed)
Physician Discharge Summary  Patient ID: Kristin Valdez MRN: [id number] DOB/AGE: 06-12-1935 80 y.o.  Admit date: 05/24/2016 Discharge date: 05/25/2016  Admission Diagnoses:  Right breast cancer  Discharge Diagnoses:  Principal Problem:   Primary cancer of upper outer quadrant of right female breast [facility]) Active Problems:   Breast cancer of upper-outer quadrant of right female breast [facility])   Discharged Condition: good  Hospital Course: 80 yr old with right breast cancer underwent right mastectomy with sentinel lymph node biopsy on 12/28 with Dr. Azalee Course.  She has been doing well postoperatively.  She has been up and walking around.  She has tolerated a regular diet.  She states pain well controlled with po meds.   She has had the JP drians before and feels comfortable with emptying them.    Consults: None  Significant Diagnostic Studies: none  Treatments: surgery: Right mastectomy with axillary SLN biopsy, Dr. Azalee Course 12/28  Discharge Exam: Blood pressure (!) 146/72, pulse 80, temperature 97.6 F (36.4 C), temperature source Oral, resp. rate 18, height 5\' 5"  (1.651 m), weight 155 lb 4.8 oz (70.4 kg), SpO2 100 %. General appearance: alert, cooperative and no distress Breasts: right breast mastectomy site clean, dry intact with sterile glue in place, some minimal bruising in lateral portion, jp drains in place with serosanginous drainage  Disposition: 01-Home or Self Care  Discharge Instructions    Call MD for:  difficulty breathing, headache or visual disturbances    Complete by:  As directed    Call MD for:  persistant nausea and vomiting    Complete by:  As directed    Call MD for:  redness, tenderness, or signs of infection (pain, swelling, redness, odor or green/yellow discharge around incision site)    Complete by:  As directed    Call MD for:  severe uncontrolled pain    Complete by:  As directed    Call MD for:  temperature >100.4    Complete by:  As directed    Diet  general    Complete by:  As directed    Discharge instructions    Complete by:  As directed    Please empty, measure, recharge and then log the JP drain output from each separately  Keep compression bra on at all times even when in bed, unless showering or for short periods until instructed by surgeon   Driving Restrictions    Complete by:  As directed    No driving while on prescription pain medication   Increase activity slowly    Complete by:  As directed    Lifting restrictions    Complete by:  As directed    No lifting over 15lbs for 6 weeks   May shower / Bathe    Complete by:  As directed    No dressing needed    Complete by:  As directed      Allergies as of 05/25/2016      Reactions   Excedrin Extra Strength [asa-apap-caff Buffered] Other (See Comments)   Shaking      Medication List    TAKE these medications   acetaminophen 325 MG tablet Commonly known as:  TYLENOL Take 650 mg by mouth every 6 (six) hours as needed for moderate pain.   cyclobenzaprine 5 MG tablet Commonly known as:  FLEXERIL Take 1 tablet (5 mg total) by mouth 3 (three) times daily as needed for muscle spasms.   ibuprofen 800 MG tablet Commonly known as:  ADVIL,MOTRIN Take 1  tablet (800 mg total) by mouth every 8 (eight) hours as needed. What changed:  medication strength  how much to take  when to take this  reasons to take this   traMADol 50 MG tablet Commonly known as:  ULTRAM Take 1 tablet (50 mg total) by mouth every 6 (six) hours as needed for severe pain.   trolamine salicylate 10 % cream Commonly known as:  ASPERCREME Apply 1 application topically as needed for muscle pain.      Follow-up Information    Phoebe Perch, MD. Go on 06/01/2016.   Specialty:  Surgery Why:  Friday at 10:00am for potentinal JP drain removal Contact information: [address] [place] [address] [phone]           Signed: Hubbard Robinson 05/25/2016, 1:25  PM

## 2016-06-01 ENCOUNTER — Encounter: Payer: Self-pay | Admitting: Surgery

## 2016-06-01 ENCOUNTER — Ambulatory Visit (INDEPENDENT_AMBULATORY_CARE_PROVIDER_SITE_OTHER): Payer: Medicare HMO | Admitting: Surgery

## 2016-06-01 VITALS — BP 219/95 | HR 88 | Temp 98.2°F | Ht 65.0 in | Wt 165.4 lb

## 2016-06-01 DIAGNOSIS — C50411 Malignant neoplasm of upper-outer quadrant of right female breast: Secondary | ICD-10-CM

## 2016-06-01 NOTE — Progress Notes (Signed)
Outpatient postop visit  06/01/2016  Kristin Valdez is an 81 y.o. female.    Procedure: Status post mastectomy and sentinel node biopsy  CC: Drains in place  HPI: This patient with you has drains in place she's had a total mastectomy on the right with a sentinel node biopsy. Her pathology is reviewed showing clear margins and 1 of 1 nodes negative (the other 2 nodes were fibroadipose tissue only).  Drainage chart is reviewed. The drain labeled #1 is minimal and were removed today.  Medications reviewed.    Physical Exam:  There were no vitals taken for this visit.    PE: Significant amount of drainage in the canister labeled #2. She only has to tablespoons recorded but there is already more than that in the drain that was apparently emptied at 7 AM this morning.  No erythema the wound is clean. Edges are approximated. Calves are nontender    Assessment/Plan:  Pathology is reviewed showing 1 of 1 lymph nodes negative. The other 2 lymph nodes were actually fibroadipose tissue and no lymphatic tissue noted in the specimen. Returns were clear and showed a lobular carcinoma invasive type.  Drain #1 is removed. The number to drain which is likely the flap drain will be removed on Monday in all likelihood. A vas patient return on Monday for reevaluation. She was informed of her pathology report as well.  Florene Glen, MD, FACS

## 2016-06-01 NOTE — Patient Instructions (Signed)
Please continue to keep track of your drain output in CC's and use the record given today to keep an accurate measurement throughout the weekend. You may notice an increase in your drain output since we removed the other drain today.   We will see you back on Monday as scheduled below.

## 2016-06-04 ENCOUNTER — Ambulatory Visit (INDEPENDENT_AMBULATORY_CARE_PROVIDER_SITE_OTHER): Payer: Medicare HMO | Admitting: General Surgery

## 2016-06-04 ENCOUNTER — Encounter: Payer: Self-pay | Admitting: General Surgery

## 2016-06-04 VITALS — BP 221/115 | HR 92 | Temp 98.7°F | Ht 65.0 in | Wt 166.8 lb

## 2016-06-04 DIAGNOSIS — Z4889 Encounter for other specified surgical aftercare: Secondary | ICD-10-CM

## 2016-06-04 NOTE — Progress Notes (Signed)
Outpatient Surgical Follow Up  06/04/2016  Kristin Valdez is an 81 y.o. female.   Chief Complaint  Patient presents with  . Routine Post Op    Right Mastectomy with Sentinel Node Biopsy (05/24/16)- Dr. Azalee Course    HPI: 81 year old female returns to clinic for additional follow-up status post a right sided mastectomy. She continues to have a drain in place and presents with a drain output log today. She states that the output increased dramatically after removal the first drain. She denies any fevers, chills, nausea, vomiting, shortness breath, diarrhea, constipation. She states she continues to improve.  Past Medical History:  Diagnosis Date  . Anxiety   . Arthritis   . Breast cancer ([place]) 2014-2015   left breast cancer  . Complication of anesthesia    post nausea and vomiting  . Depression   . Numbness and tingling     Past Surgical History:  Procedure Laterality Date  . ABDOMINAL HYSTERECTOMY    . APPENDECTOMY    . BREAST BIOPSY Left    Stereotactic biopsy  . BREAST BIOPSY Right 05/02/2016   US guided biopsy  . MASTECTOMY Left 2015  . MASTECTOMY W/ SENTINEL NODE BIOPSY Right 05/24/2016   Procedure: MASTECTOMY WITH SENTINEL LYMPH NODE BIOPSY;  Surgeon: Hubbard Robinson, MD;  Location: [facility] ORS;  Service: General;  Laterality: Right;    Family History  Problem Relation Age of Onset  . Heart disease Mother   . Prostate cancer Father   . Stroke Brother   . Healthy Daughter     Social History:  reports that she quit smoking about 45 years ago. She has never used smokeless tobacco. She reports that she drinks alcohol. She reports that she does not use drugs.  Allergies:  Allergies  Allergen Reactions  . Excedrin Extra Strength [Asa-Apap-Caff Buffered] Other (See Comments)    Shaking    Medications reviewed.    ROS A multipoint review of systems was completed. All pertinent positives and negatives are documented within the history of present illness and remainder  are negative.   BP (!) 221/115   Pulse 92   Temp 98.7 F (37.1 C) (Oral)   Ht 5\' 5"  (1.651 m)   Wt 75.7 kg (166 lb 12.8 oz)   BMI 27.76 kg/m   Physical Exam Gen.: No acute distress Chest: Clear to auscultation Breast: Right breast is surgically absent with a incision site well approximated with glue. JP drain present coming out of the right axilla with serosanguineous fluid within the drain. Heart: Regular rate and rhythm Abdomen: Soft and nontender    No results found for this or any previous visit (from the past 48 hour(s)). No results found.  Assessment/Plan:  1. Aftercare following surgery 81 year old female status post right-sided mastectomy. Greater than 40 mL of output over the last 24 hours. Discussed with her that given the output levels that are increasing incentive decreasing that it would not be prudent to remove her drain today. Discussed continuing to keep a log. We'll plan for her to follow-up in 48 hours to readdress and hopefully remove at that time. All questions answered to the patient's satisfaction.     Clayburn Pert, MD FACS General Surgeon  06/04/2016,1:10 PM

## 2016-06-04 NOTE — Patient Instructions (Signed)
We will see you back on Wednesday morning. If you have the same amount of drainage tomorrow afternoon, please call and speak with a nurse so that we can move your appointment out 24-48 hours depending on the amount of drainage.  Continue wearing your surgical bra with padding in it until told otherwise.  Your appointment information is below.

## 2016-06-06 ENCOUNTER — Encounter: Payer: Self-pay | Admitting: General Surgery

## 2016-06-06 NOTE — Progress Notes (Signed)
[place]  [phone]) [phone] [phone]  ID: Kristin Valdez OB: [birth year]  MR#: [id number]  WLN#:[id number]  Patient Care Team: Sherrin Daisy, MD as PCP - General (Family Medicine)  CHIEF COMPLAINT: Pathologic stage Ia ER positive, PR negative, HER-2 negative invasive lobular carcinoma of the upper outer quadrant of the right breast.  INTERVAL HISTORY: Patient returns to clinic today for further evaluation and discussion of her final pathology results. She continues to have a drain in her chest wall, but otherwise is doing well and asymptomatic. She has no neurologic complaints. She denies any recent fevers or illnesses. She has a good appetite and denies weight loss. She has no chest pain or shortness of breath. She denies any nausea, vomiting, constipation, or diarrhea. She has no urinary complaints. Patient offers no further specific complaints today.  REVIEW OF SYSTEMS:   Review of Systems  Constitutional: Negative.  Negative for fever, malaise/fatigue and weight loss.  Respiratory: Negative.  Negative for cough.   Cardiovascular: Negative.  Negative for chest pain and leg swelling.  Gastrointestinal: Negative.  Negative for abdominal pain.  Genitourinary: Negative.   Musculoskeletal: Negative.   Neurological: Negative.  Negative for weakness.  Psychiatric/Behavioral: The patient is not nervous/anxious.     As per HPI. Otherwise, a complete review of systems is negative.  PAST MEDICAL HISTORY: Past Medical History:  Diagnosis Date  . Anxiety   . Arthritis   . Breast cancer ([place]) 2014-2015   left breast cancer  . Complication of anesthesia    post nausea and vomiting  . Depression   . Numbness and tingling     PAST SURGICAL HISTORY: Past Surgical History:  Procedure Laterality Date  . ABDOMINAL HYSTERECTOMY    . APPENDECTOMY    . BREAST BIOPSY Left    Stereotactic biopsy  . BREAST BIOPSY Right 05/02/2016   US guided biopsy  .  MASTECTOMY Left 2015  . MASTECTOMY W/ SENTINEL NODE BIOPSY Right 05/24/2016   Procedure: MASTECTOMY WITH SENTINEL LYMPH NODE BIOPSY;  Surgeon: Hubbard Robinson, MD;  Location: [facility] ORS;  Service: General;  Laterality: Right;    FAMILY HISTORY: Family History  Problem Relation Age of Onset  . Heart disease Mother   . Prostate cancer Father   . Stroke Brother   . Healthy Daughter     ADVANCED DIRECTIVES (Y/N):  N  HEALTH MAINTENANCE: Social History  Substance Use Topics  . Smoking status: Former Smoker    Quit date: 01/09/1971  . Smokeless tobacco: Never Used  . Alcohol use Yes     Comment: Occasional     Colonoscopy:  PAP:  Bone density:  Lipid panel:  Allergies  Allergen Reactions  . Excedrin Extra Strength [Asa-Apap-Caff Buffered] Other (See Comments)    Shaking    Current Outpatient Prescriptions  Medication Sig Dispense Refill  . acetaminophen (TYLENOL) 325 MG tablet Take 650 mg by mouth every 6 (six) hours as needed for moderate pain.    Marland Kitchen ibuprofen (ADVIL,MOTRIN) 800 MG tablet Take 1 tablet (800 mg total) by mouth every 8 (eight) hours as needed. 30 tablet 0  . letrozole (FEMARA) 2.5 MG tablet Take 1 tablet (2.5 mg total) by mouth daily. 30 tablet 5  . traMADol (ULTRAM) 50 MG tablet      No current facility-administered medications for this visit.     OBJECTIVE: Vitals:   06/07/16 1504  BP: (!) 161/106  Pulse: 88  Resp: 18  Temp: 98.9 F (37.2 C)  Body mass index is 27.42 kg/m.    ECOG FS:0 - Asymptomatic  General: Well-developed, well-nourished, no acute distress. Eyes: Pink conjunctiva, anicteric sclera. Breasts: Left breast mastectomy. HEENT: Normocephalic, moist mucous membranes, clear oropharnyx. Lungs: Clear to auscultation bilaterally. Heart: Regular rate and rhythm. No rubs, murmurs, or gallops. Abdomen: Soft, nontender, nondistended. No organomegaly noted, normoactive bowel sounds. Musculoskeletal: No edema, cyanosis, or  clubbing. Neuro: Alert, answering all questions appropriately. Cranial nerves grossly intact. Skin: No rashes or petechiae noted. Psych: Normal affect.   LAB RESULTS:  Lab Results  Component Value Date   NA 140 05/17/2016   K 3.8 05/17/2016   CL 104 05/17/2016   CO2 28 05/17/2016   GLUCOSE 93 05/17/2016   BUN 19 05/17/2016   CREATININE 0.87 05/17/2016   CALCIUM 9.7 05/17/2016   PROT 8.2 (H) 05/17/2016   ALBUMIN 4.2 05/17/2016   AST 21 05/17/2016   ALT 21 05/17/2016   ALKPHOS 73 05/17/2016   BILITOT 0.1 (L) 05/17/2016   GFRNONAA >60 05/17/2016   GFRAA >60 05/17/2016    Lab Results  Component Value Date   WBC 9.1 05/17/2016   NEUTROABS 5.9 05/17/2016   HGB 13.9 05/17/2016   HCT 41.7 05/17/2016   MCV 93.2 05/17/2016   PLT 237 05/17/2016     STUDIES: Chest 2 View  Result Date: 05/17/2016 CLINICAL DATA:  Breast cancer, preop. EXAM: CHEST  2 VIEW COMPARISON:  None. FINDINGS: The cardiomediastinal contours are normal. Mild biapical pleuroparenchymal scarring, lungs are otherwise clear. No pulmonary mass or dominant pulmonary nodule. Pulmonary vasculature is normal. No consolidation, pleural effusion, or pneumothorax. No acute osseous abnormalities are seen. Post left mastectomy per IMPRESSION: No active cardiopulmonary disease. Electronically Signed   By: Jeb Levering M.D.   On: 05/17/2016 15:32   Nm Sentinel Node Injection  Result Date: 05/24/2016 CLINICAL DATA:  Right sided breast cancer. EXAM: NUCLEAR MEDICINE BREAST LYMPHOSCINTIGRAPHY TECHNIQUE: Intradermal injection of radiopharmaceutical was performed at the 9 o'clock position around the right nipple. The patient was then sent to the operating room where the sentinel node(s) were identified and removed by the surgeon. RADIOPHARMACEUTICALS:  Total of 1 mCi Millipore-filtered Technetium-73msulfur colloid was injected. IMPRESSION: Uncomplicated intradermal injection of a total of 1 mCi Technetium-937mulfur colloid  for purposes of sentinel node identification. Electronically Signed   By: JoSandi Mariscal.D.   On: 05/24/2016 10:08    ASSESSMENT: Pathologic stage Ia ER positive, PR negative, HER-2 negative invasive lobular carcinoma of the upper outer quadrant of the right breast.   PLAN:    1. Pathologic stage Ia ER positive, PR negative, HER-2 negative invasive lobular carcinoma of the upper outer quadrant of the right breast: Patient has a history of left breast cancer and is status post mastectomy. She did not receive adjuvant chemotherapy or XRT at that time. She reports taking an aromatase inhibitor for 3-4 weeks, but then discontinued secondary to side effects and did not try another medication. It appears based on pathology, that the cancer in her right breast is a second primary. She has now had a right mastectomy therefore does not require XRT. She has agreed to reattempt an aromatase inhibitor and was given a prescription for letrozole. If she is compliant with treatment, she will take this for 5 years completing in January 2023. Return to clinic in 3 months for routine evaluation. 2. Postmenopausal: Will get baseline bone mineral density in the next 1-2 weeks.  Approximately 25 minutes was spent in discussion of which  greater than 50% was consultation.   Patient expressed understanding and was in agreement with this plan. She also understands that She can call clinic at any time with any questions, concerns, or complaints.   Primary cancer of upper outer quadrant of right female breast [place])   Staging form: Breast, AJCC 7th Edition   - Pathologic stage from 06/10/2016: Stage IA (T1c, N0, cM0) - Signed by Lloyd Huger, MD on 06/10/2016  Lloyd Huger, MD   06/10/2016 9:48 AM

## 2016-06-07 ENCOUNTER — Encounter: Payer: Self-pay | Admitting: General Surgery

## 2016-06-07 ENCOUNTER — Inpatient Hospital Stay: Payer: Medicare HMO | Attending: Oncology | Admitting: Oncology

## 2016-06-07 ENCOUNTER — Ambulatory Visit (INDEPENDENT_AMBULATORY_CARE_PROVIDER_SITE_OTHER): Payer: Medicare HMO | Admitting: General Surgery

## 2016-06-07 VITALS — BP 188/123 | HR 86 | Temp 98.1°F | Ht 65.0 in | Wt 165.0 lb

## 2016-06-07 VITALS — BP 161/106 | HR 88 | Temp 98.9°F | Resp 18 | Wt 164.8 lb

## 2016-06-07 DIAGNOSIS — Z87891 Personal history of nicotine dependence: Secondary | ICD-10-CM | POA: Insufficient documentation

## 2016-06-07 DIAGNOSIS — C50411 Malignant neoplasm of upper-outer quadrant of right female breast: Secondary | ICD-10-CM

## 2016-06-07 DIAGNOSIS — Z9012 Acquired absence of left breast and nipple: Secondary | ICD-10-CM | POA: Diagnosis not present

## 2016-06-07 DIAGNOSIS — Z4889 Encounter for other specified surgical aftercare: Secondary | ICD-10-CM

## 2016-06-07 DIAGNOSIS — M1990 Unspecified osteoarthritis, unspecified site: Secondary | ICD-10-CM

## 2016-06-07 DIAGNOSIS — Z853 Personal history of malignant neoplasm of breast: Secondary | ICD-10-CM | POA: Insufficient documentation

## 2016-06-07 DIAGNOSIS — Z79811 Long term (current) use of aromatase inhibitors: Secondary | ICD-10-CM | POA: Diagnosis not present

## 2016-06-07 DIAGNOSIS — Z8042 Family history of malignant neoplasm of prostate: Secondary | ICD-10-CM | POA: Insufficient documentation

## 2016-06-07 DIAGNOSIS — Z780 Asymptomatic menopausal state: Secondary | ICD-10-CM | POA: Insufficient documentation

## 2016-06-07 DIAGNOSIS — Z170 Estrogen receptor positive status [ER+]: Secondary | ICD-10-CM | POA: Insufficient documentation

## 2016-06-07 DIAGNOSIS — Z9223 Personal history of estrogen therapy: Secondary | ICD-10-CM | POA: Diagnosis not present

## 2016-06-07 DIAGNOSIS — Z79899 Other long term (current) drug therapy: Secondary | ICD-10-CM | POA: Diagnosis not present

## 2016-06-07 MED ORDER — LETROZOLE 2.5 MG PO TABS: 2.5000 mg | ORAL_TABLET | Freq: Every day | ORAL | 5 refills | Status: DC

## 2016-06-07 NOTE — Progress Notes (Signed)
Patient is here for follow up, she is doing well no complaints, blood pressure was taken on right leg

## 2016-06-07 NOTE — Progress Notes (Signed)
Outpatient Surgical Follow Up  06/07/2016  Kristin Valdez is an 81 y.o. female.   Chief Complaint  Patient presents with  . Routine Post Op    Right Mastectomy with Sentinell Nodes-05/24/16-Dr.Loflin    HPI: 81 year old female returns to clinic 2 weeks status post right mastectomy. Since her last visit she has continued to have drain output. The amount coming out from the drain is actually increased rather than decreased since her last visit. She denies any fevers, chills, nausea, vomiting, chest pain, shortness of breath. She simply ready to have the drain removed.  Past Medical History:  Diagnosis Date  . Anxiety   . Arthritis   . Breast cancer ([place]) 2014-2015   left breast cancer  . Complication of anesthesia    post nausea and vomiting  . Depression   . Numbness and tingling     Past Surgical History:  Procedure Laterality Date  . ABDOMINAL HYSTERECTOMY    . APPENDECTOMY    . BREAST BIOPSY Left    Stereotactic biopsy  . BREAST BIOPSY Right 05/02/2016   US guided biopsy  . MASTECTOMY Left 2015  . MASTECTOMY W/ SENTINEL NODE BIOPSY Right 05/24/2016   Procedure: MASTECTOMY WITH SENTINEL LYMPH NODE BIOPSY;  Surgeon: Hubbard Robinson, MD;  Location: [facility] ORS;  Service: General;  Laterality: Right;    Family History  Problem Relation Age of Onset  . Heart disease Mother   . Prostate cancer Father   . Stroke Brother   . Healthy Daughter     Social History:  reports that she quit smoking about 45 years ago. She has never used smokeless tobacco. She reports that she drinks alcohol. She reports that she does not use drugs.  Allergies:  Allergies  Allergen Reactions  . Excedrin Extra Strength [Asa-Apap-Caff Buffered] Other (See Comments)    Shaking    Medications reviewed.    ROS A multipoint review of systems was completed. All pertinent positives and negatives are documented within the history of present illness and the remainder are negative.   BP (!)  188/123   Pulse 86   Temp 98.1 F (36.7 C) (Oral)   Ht 5\' 5"  (1.651 m)   Wt 74.8 kg (165 lb)   BMI 27.46 kg/m   Physical Exam Gen.: No acute distress Chest: Clear to auscultation Breast: Right breast surgically absent with JP drain still in place draining a serous fluid Heart: Regular rate and rhythm Abdomen: Soft and nontender    No results found for this or any previous visit (from the past 48 hour(s)). No results found.  Assessment/Plan:  1. Aftercare following surgery 81 year old female with a JP drain still in place from a right-sided mastectomy. 45 mL removed in the last 24 hours. Counseled the patient that it is still too much output to remove the drain safely without a high risk of seroma formation. Follow-up appointment is scheduled for next Tuesday for likely removal. However, if less than 20 mL comes out of the drain in the next 24 hours she will call the clinic to be seen prior to the holiday weekend have the drain removed. If the drain remains she will call over the weekend should there be any complication from the drain such as infection.     Clayburn Pert, MD FACS General Surgeon  06/07/2016,3:08 PM

## 2016-06-07 NOTE — Patient Instructions (Addendum)
Please continue wearing your surgical Bra with padding until told otherwise. Please see your follow up appointment listed below.  Please call our office by noon tomorrow if your drain out is 20 cc or less, otherwise we will see you on Monday.

## 2016-06-11 ENCOUNTER — Encounter: Payer: Self-pay | Admitting: Surgery

## 2016-06-11 ENCOUNTER — Ambulatory Visit (INDEPENDENT_AMBULATORY_CARE_PROVIDER_SITE_OTHER): Payer: Medicare HMO | Admitting: Surgery

## 2016-06-11 VITALS — BP 206/136 | HR 92 | Temp 98.4°F | Ht 65.0 in | Wt 165.4 lb

## 2016-06-11 DIAGNOSIS — C50411 Malignant neoplasm of upper-outer quadrant of right female breast: Secondary | ICD-10-CM

## 2016-06-11 NOTE — Patient Instructions (Signed)
Please call our office if you have concerns or questions. Please see your appointment listed below.

## 2016-06-11 NOTE — Progress Notes (Signed)
Outpatient postop visit  06/11/2016  Kristin Valdez is an 81 y.o. female.    Procedure: Total mastectomy with sentinel node biopsy  CC: Drainage tube  HPI: Patient has 1 remaining JP drain in place. Today in the last 24 hours it put out 30 cc. In the prior 24 hours it put out 20 cc and in the 24 hours prior to that it put out 25 cc. The patient wants this removed but is quite reluctant to have it removed.  Medications reviewed.    Physical Exam:  BP (!) 206/136   Pulse 92   Temp 98.4 F (36.9 C) (Oral)   Ht 5\' 5"  (1.651 m)   Wt 165 lb 6.4 oz (75 kg)   BMI 27.52 kg/m     PE: No erythema no drainage minimal superficial skin necrosis at the skin edges. Serous fluid in the drain.    Assessment/Plan:  Drain is removed at this point as there is only been 85 cc in the last 3 days. We did discuss the potential for seroma formation but this drain is been in long enough to warrant removal. We'll have her see Dr. Azalee Course in the next few weeks.  Florene Glen, MD, FACS

## 2016-06-22 ENCOUNTER — Encounter: Payer: Self-pay | Admitting: Surgery

## 2016-06-27 ENCOUNTER — Ambulatory Visit (INDEPENDENT_AMBULATORY_CARE_PROVIDER_SITE_OTHER): Payer: Medicare HMO | Admitting: General Surgery

## 2016-06-27 ENCOUNTER — Encounter: Payer: Self-pay | Admitting: General Surgery

## 2016-06-27 VITALS — BP 212/111 | HR 91 | Temp 97.3°F | Wt 165.0 lb

## 2016-06-27 DIAGNOSIS — Z4889 Encounter for other specified surgical aftercare: Secondary | ICD-10-CM

## 2016-06-27 NOTE — Patient Instructions (Signed)
Please call our office with any questions or concerns.  Please do not submerge in a tub, hot tub, or pool until incisions are completely sealed.  If you develop redness, drainage, or pain at incision sites- call our office immediately and speak with a nurse.  You could trim the excessive glue with a pair of scissors. Do not pull the glue off.  At this time you could stop using the supporting bra. Just wear a tight shirt to help with compression.

## 2016-06-27 NOTE — Progress Notes (Signed)
Outpatient Surgical Follow Up  06/27/2016  Kristin Valdez is an 81 y.o. female.   Chief Complaint  Patient presents with  . Other    Right Mastectomy with sentinel node Biopsy-05/24/16-Dr.Loflin    HPI: 81 year old female returns to clinic for 1 month follow-up from a right breast mastectomy. Patient had her JP drain removed at her last visit and has not had any drainage since. She denies any pain, fevers, chills, nausea, vomiting, chest pain, shortness of breath. She does have some soreness to the area but states it is continuing to improve.  Past Medical History:  Diagnosis Date  . Anxiety   . Arthritis   . Breast cancer ([place]) 2014-2015   left breast cancer  . Complication of anesthesia    post nausea and vomiting  . Depression   . Numbness and tingling     Past Surgical History:  Procedure Laterality Date  . ABDOMINAL HYSTERECTOMY    . APPENDECTOMY    . BREAST BIOPSY Left    Stereotactic biopsy  . BREAST BIOPSY Right 05/02/2016   US guided biopsy  . MASTECTOMY Left 2015  . MASTECTOMY W/ SENTINEL NODE BIOPSY Right 05/24/2016   Procedure: MASTECTOMY WITH SENTINEL LYMPH NODE BIOPSY;  Surgeon: Hubbard Robinson, MD;  Location: [facility] ORS;  Service: General;  Laterality: Right;    Family History  Problem Relation Age of Onset  . Heart disease Mother   . Prostate cancer Father   . Stroke Brother   . Healthy Daughter     Social History:  reports that she quit smoking about 45 years ago. She has never used smokeless tobacco. She reports that she drinks alcohol. She reports that she does not use drugs.  Allergies:  Allergies  Allergen Reactions  . Excedrin Extra Strength [Asa-Apap-Caff Buffered] Other (See Comments)    Shaking    Medications reviewed.    ROS A multipoint review of systems was completed. All pertinent positives and negatives are documented in the history of present illness   BP (!) 212/111   Pulse 91   Temp 97.3 F (36.3 C) (Oral)   Wt 74.8 kg  (165 lb)   BMI 27.46 kg/m   Physical Exam Gen.: No acute distress Chest: Clear to auscultation Breast: Bilateral breast surgically absent. Right breast with Dermabond still present. There is a palpable seroma in the axilla but no evidence of erythema or purulence. Multiple areas of skin folds were the lateral aspect of the incision folds on itself. Heart: Regular rate and rhythm Abdomen: Soft and nontender    No results found for this or any previous visit (from the past 48 hour(s)). No results found.  Assessment/Plan:  1. Aftercare following surgery 81 year old female status post a right mastectomy with sentinel node biopsy. Doing well. Discussed signs and symptoms of infection and to return to clinic medially should they occur. Otherwise she'll follow-up in clinic in 2 weeks for additional wound check.     Clayburn Pert, MD FACS General Surgeon  06/27/2016,3:18 PM

## 2016-07-11 ENCOUNTER — Telehealth: Payer: Self-pay

## 2016-07-11 ENCOUNTER — Ambulatory Visit (INDEPENDENT_AMBULATORY_CARE_PROVIDER_SITE_OTHER): Payer: Medicare HMO | Admitting: General Surgery

## 2016-07-11 ENCOUNTER — Encounter: Payer: Self-pay | Admitting: General Surgery

## 2016-07-11 VITALS — Ht 65.0 in | Wt 166.4 lb

## 2016-07-11 DIAGNOSIS — Z4889 Encounter for other specified surgical aftercare: Secondary | ICD-10-CM

## 2016-07-11 NOTE — Patient Instructions (Signed)
I will call you back once I schedule an appointment with your primary doctor.  So far everything looks good. However, if you have any questions or concerns, please give [place] a call.  We will see you back in a month to make sure that everything looks good.

## 2016-07-11 NOTE — Telephone Encounter (Signed)
Called patient to let her know that I had scheduled an appointment to see her primary care doctor on 07/17/2016 at 8:30 AM. Patient understood and had no further questions.

## 2016-07-11 NOTE — Progress Notes (Signed)
Outpatient Surgical Follow Up  07/11/2016  Kristin Valdez is an 81 y.o. female.   Chief Complaint  Patient presents with  . Routine Post Op    Right Mastectomy with Sentinel Node Biopsy (05/24/16)- Dr. Azalee Course    HPI: 81 year old female returns to clinic 6 weeks status post right mastectomy and sentinel lymph node biopsy. She reports doing very well. She had some tenderness to the superior aspect of her excision site associated with swelling. She reports this is improved. She also complains of some redness to the skin on the lower aspects of the skin folds. She denies any fevers, chills, nausea, vomiting, chest pain, shortness of breath, diarrhea, constipation.  Past Medical History:  Diagnosis Date  . Anxiety   . Arthritis   . Breast cancer ([place]) 2014-2015   left breast cancer  . Complication of anesthesia    post nausea and vomiting  . Depression   . Numbness and tingling     Past Surgical History:  Procedure Laterality Date  . ABDOMINAL HYSTERECTOMY    . APPENDECTOMY    . BREAST BIOPSY Left    Stereotactic biopsy  . BREAST BIOPSY Right 05/02/2016   US guided biopsy  . MASTECTOMY Left 2015  . MASTECTOMY W/ SENTINEL NODE BIOPSY Right 05/24/2016   Procedure: MASTECTOMY WITH SENTINEL LYMPH NODE BIOPSY;  Surgeon: Hubbard Robinson, MD;  Location: [facility] ORS;  Service: General;  Laterality: Right;    Family History  Problem Relation Age of Onset  . Heart disease Mother   . Prostate cancer Father   . Stroke Brother   . Healthy Daughter     Social History:  reports that she quit smoking about 45 years ago. She has never used smokeless tobacco. She reports that she drinks alcohol. She reports that she does not use drugs.  Allergies:  Allergies  Allergen Reactions  . Excedrin Extra Strength [Asa-Apap-Caff Buffered] Other (See Comments)    Shaking    Medications reviewed.    ROS  A multipoint review of systems was completed. All pertinent positives and negatives are  documented within the history of present illness and the remainder negative  Ht 5\' 5"  (1.651 m)   Wt 75.5 kg (166 lb 6.4 oz)   BMI 27.69 kg/m   Physical Exam Gen.: No acute distress Neck: Supple and nontender Breasts: Bilateral breasts surgically absent. Recent right mastectomy incision site well approximated with surgical glue still in place. No evidence of erythema or cellulitis. Palpable small seroma to the cephalad aspect of the axilla but without evidence of abscess. Left breast surgically absent with left axillary skin irritation but also without evidence of erythema or drainage. Chest: Clear to auscultation Heart: Regular rhythm Abdomen: Soft and nontender    No results found for this or any previous visit (from the past 48 hour(s)). No results found.  Assessment/Plan:  1. Aftercare following surgery 81 year old female status post right mastectomy for breast cancer. Healing well currently. Evidence of a resolving seroma to the right axilla but no evidence of infection. Patient has been seen by oncology and is on an aromatase inhibitor for chemotherapy preventative treatment. Patient with extremely high blood pressure today in clinic. She reports that it is not this high of the other clinics however given that we had multiple readings of high blood pressure we will refer her to her primary care clinic for follow-up and management as appropriate. Patient will follow-up in clinic in 1 month for an additional wound check.  Clayburn Pert, MD FACS General Surgeon  07/11/2016,2:07 PM

## 2016-07-12 DIAGNOSIS — J00 Acute nasopharyngitis [common cold]: Secondary | ICD-10-CM | POA: Diagnosis not present

## 2016-07-12 DIAGNOSIS — R030 Elevated blood-pressure reading, without diagnosis of hypertension: Secondary | ICD-10-CM | POA: Diagnosis not present

## 2016-07-23 DIAGNOSIS — H5203 Hypermetropia, bilateral: Secondary | ICD-10-CM | POA: Diagnosis not present

## 2016-07-26 ENCOUNTER — Telehealth: Payer: Self-pay | Admitting: *Deleted

## 2016-07-26 NOTE — Telephone Encounter (Signed)
Per VO Dr Grayland Ormond, stop the Letrozole, ok to move appt up, but needs to be off letrozolew for 2 weeks first. Appt changed to 3/19 @ 11:30 and patient agrees to appt

## 2016-07-26 NOTE — Telephone Encounter (Signed)
Letrozole is causing her numerous problems. She is having "menstrual cramps" intermittently (denies vaginal bleeding), night sweats, headaches, loose stools 3 -4 times a day, She said these were worse when she was taking med every day, but she has decreased her dose to every other day and continues to have problems with the med. I advised her to stop the medication until she sees you. She would like to come in to be seen ASAP. Please advise

## 2016-08-12 NOTE — Progress Notes (Signed)
[place]  [phone] [phone]  ID: Kristin Valdez OB: Feb 21, 1936  MR#: [id number]  WHQ#:[id number]  Patient Care Team: Sherrin Daisy, MD as PCP - General (Family Medicine)  CHIEF COMPLAINT: Pathologic stage Ia ER positive, PR negative, HER-2 negative invasive lobular carcinoma of the upper outer quadrant of the right breast.  INTERVAL HISTORY: Patient returns to clinic today for 3 month follow-up. She was re-started on Letrozole in January 2018 to see if she was able to tolerate but she has had too many side effects. She stopped taking letrozole approximately two weeks and symptoms have mostly improved. She continues to have headaches and hot flashes but they are much better then they were. The most concerning side effect was worsening depression and suicidal thoughts. She denies suicidal ideation at this time. She also has been worried about an elevated blood pressure that she noticed while on letrozole. She is being followed weekly my her PCP for this. She denies any recent fevers or illnesses. She has a good appetite and denies weight loss. She has no chest pain or shortness of breath. She denies any nausea, vomiting, constipation, or diarrhea. She has no urinary complaints. Patient offers no further specific complaints today.  REVIEW OF SYSTEMS:   Review of Systems  Constitutional: Negative.  Negative for fever, malaise/fatigue and weight loss.  Respiratory: Negative.  Negative for cough.   Cardiovascular: Negative.  Negative for chest pain and leg swelling.       Hypertension- Being monitored by PCP  Gastrointestinal: Negative.  Negative for abdominal pain.  Genitourinary: Negative.   Musculoskeletal: Negative.   Neurological: Positive for headaches. Negative for weakness.       Occasional hot flashes  Psychiatric/Behavioral: Positive for depression and suicidal ideas (No at this time.). The patient is not nervous/anxious.     As per HPI.  Otherwise, a complete review of systems is negative.  PAST MEDICAL HISTORY: Past Medical History:  Diagnosis Date  . Anxiety   . Arthritis   . Breast cancer ([place]) 2014-2015   left breast cancer  . Complication of anesthesia    post nausea and vomiting  . Depression   . Numbness and tingling     PAST SURGICAL HISTORY: Past Surgical History:  Procedure Laterality Date  . ABDOMINAL HYSTERECTOMY    . APPENDECTOMY    . BREAST BIOPSY Left    Stereotactic biopsy  . BREAST BIOPSY Right 05/02/2016   US guided biopsy  . MASTECTOMY Left 2015  . MASTECTOMY W/ SENTINEL NODE BIOPSY Right 05/24/2016   Procedure: MASTECTOMY WITH SENTINEL LYMPH NODE BIOPSY;  Surgeon: Hubbard Robinson, MD;  Location: [facility] ORS;  Service: General;  Laterality: Right;    FAMILY HISTORY: Family History  Problem Relation Age of Onset  . Heart disease Mother   . Prostate cancer Father   . Stroke Brother   . Healthy Daughter     ADVANCED DIRECTIVES (Y/N):  N  HEALTH MAINTENANCE: Social History  Substance Use Topics  . Smoking status: Former Smoker    Quit date: 01/09/1971  . Smokeless tobacco: Never Used  . Alcohol use Yes     Comment: Occasional     Colonoscopy:  PAP:  Bone density:  Lipid panel:  Allergies  Allergen Reactions  . Excedrin Extra Strength [Asa-Apap-Caff Buffered] Other (See Comments)    Shaking    Current Outpatient Prescriptions  Medication Sig Dispense Refill  . acetaminophen (TYLENOL) 325 MG tablet Take 650 mg by mouth every 6 (  six) hours as needed for moderate pain.    Marland Kitchen ibuprofen (ADVIL,MOTRIN) 800 MG tablet Take 1 tablet (800 mg total) by mouth every 8 (eight) hours as needed. 30 tablet 0   No current facility-administered medications for this visit.     OBJECTIVE: Vitals:   08/13/16 1144 08/13/16 1218  BP: (!) 179/107 (!) 180/97  Pulse: 87 86  Resp: 18   Temp: (!) 96.6 F (35.9 C)      Body mass index is 27.54 kg/m.    ECOG FS:0 - Asymptomatic  General:  Well-developed, well-nourished, no acute distress. Eyes: Pink conjunctiva, anicteric sclera. Breasts: Bilateral breast mastectomy. Lungs: Clear to auscultation bilaterally. Heart: Regular rate and rhythm. No rubs, murmurs, or gallops. Abdomen: Soft, nontender, nondistended. No organomegaly noted, normoactive bowel sounds. Musculoskeletal: No edema, cyanosis, or clubbing. Neuro: Alert, answering all questions appropriately. Cranial nerves grossly intact. Skin: No rashes or petechiae noted. Psych: Normal affect.   LAB RESULTS:  Lab Results  Component Value Date   NA 140 05/17/2016   K 3.8 05/17/2016   CL 104 05/17/2016   CO2 28 05/17/2016   GLUCOSE 93 05/17/2016   BUN 19 05/17/2016   CREATININE 0.87 05/17/2016   CALCIUM 9.7 05/17/2016   PROT 8.2 (H) 05/17/2016   ALBUMIN 4.2 05/17/2016   AST 21 05/17/2016   ALT 21 05/17/2016   ALKPHOS 73 05/17/2016   BILITOT 0.1 (L) 05/17/2016   GFRNONAA >60 05/17/2016   GFRAA >60 05/17/2016    Lab Results  Component Value Date   WBC 9.1 05/17/2016   NEUTROABS 5.9 05/17/2016   HGB 13.9 05/17/2016   HCT 41.7 05/17/2016   MCV 93.2 05/17/2016   PLT 237 05/17/2016     STUDIES: No results found.  ASSESSMENT: Pathologic stage Ia ER positive, PR negative, HER-2 negative invasive lobular carcinoma of the upper outer quadrant of the right breast.   PLAN:    1. Pathologic stage Ia ER positive, PR negative, HER-2 negative invasive lobular carcinoma of the upper outer quadrant of the right breast: Patient has a history of left breast cancer and is status post mastectomy. She did not receive adjuvant chemotherapy or XRT at that time. She reports taking an aromatase inhibitor for 3-4 weeks, but then discontinued secondary to side effects and did not try another medication. It appears based on pathology, that the cancer in her right breast is a second primary. She has now had a right mastectomy therefore does not require XRT. She agreed to  reattempt an aromatase inhibitor and was unsuccessful. She tried to to take for two months but developed severe side effects (worsening depression and suicidal ideation, joint pain and hot flashes. She stopped the letrozole two weeks ago.  2. Postmenopausal: Needs bone density. Re-ordered.  3. Hypertension: High blood pressure readings today. HR ok. She is followed by Gaetano Net, NP at [facility] in [place]. Her next BP check is on Thursday. They have been monitoring her for several weeks now. Will send our note with BP readings from today.   Jacquelin Hawking NP  Patient expressed understanding and was in agreement with this plan. She also understands that She can call clinic at any time with any questions, concerns, or complaints.   Cancer Staging Primary cancer of upper outer quadrant of right female breast [facility]) Staging form: Breast, AJCC 7th Edition - Pathologic stage from 06/10/2016: Stage IA (T1c, N0, cM0) - Signed by Lloyd Huger, MD on 06/10/2016  Patient was seen and evaluated  independently and I agree with the assessment and plan as dictated above. Return to clinic in 3 months for further evaluation.  Lloyd Huger, MD 08/17/16 4:35 PM

## 2016-08-13 ENCOUNTER — Encounter: Payer: Self-pay | Admitting: General Surgery

## 2016-08-13 ENCOUNTER — Ambulatory Visit (INDEPENDENT_AMBULATORY_CARE_PROVIDER_SITE_OTHER): Payer: Medicare HMO | Admitting: General Surgery

## 2016-08-13 ENCOUNTER — Inpatient Hospital Stay: Payer: Medicare HMO | Attending: Oncology | Admitting: Oncology

## 2016-08-13 VITALS — BP 180/97 | HR 86 | Temp 96.6°F | Resp 18 | Wt 165.5 lb

## 2016-08-13 VITALS — BP 175/103 | HR 92 | Temp 98.7°F | Ht 65.0 in | Wt 165.2 lb

## 2016-08-13 DIAGNOSIS — R51 Headache: Secondary | ICD-10-CM | POA: Diagnosis not present

## 2016-08-13 DIAGNOSIS — R232 Flushing: Secondary | ICD-10-CM | POA: Diagnosis not present

## 2016-08-13 DIAGNOSIS — I10 Essential (primary) hypertension: Secondary | ICD-10-CM | POA: Insufficient documentation

## 2016-08-13 DIAGNOSIS — Z853 Personal history of malignant neoplasm of breast: Secondary | ICD-10-CM

## 2016-08-13 DIAGNOSIS — Z170 Estrogen receptor positive status [ER+]: Secondary | ICD-10-CM

## 2016-08-13 DIAGNOSIS — C50411 Malignant neoplasm of upper-outer quadrant of right female breast: Secondary | ICD-10-CM | POA: Diagnosis not present

## 2016-08-13 DIAGNOSIS — Z9223 Personal history of estrogen therapy: Secondary | ICD-10-CM | POA: Insufficient documentation

## 2016-08-13 DIAGNOSIS — Z79899 Other long term (current) drug therapy: Secondary | ICD-10-CM | POA: Insufficient documentation

## 2016-08-13 DIAGNOSIS — Z87891 Personal history of nicotine dependence: Secondary | ICD-10-CM | POA: Diagnosis not present

## 2016-08-13 DIAGNOSIS — F329 Major depressive disorder, single episode, unspecified: Secondary | ICD-10-CM | POA: Diagnosis not present

## 2016-08-13 DIAGNOSIS — M1990 Unspecified osteoarthritis, unspecified site: Secondary | ICD-10-CM | POA: Diagnosis not present

## 2016-08-13 DIAGNOSIS — Z9013 Acquired absence of bilateral breasts and nipples: Secondary | ICD-10-CM | POA: Insufficient documentation

## 2016-08-13 DIAGNOSIS — Z780 Asymptomatic menopausal state: Secondary | ICD-10-CM | POA: Diagnosis not present

## 2016-08-13 NOTE — Patient Instructions (Signed)
Please call our office if you have any questions or concerns.Please see your follow up appointment listed below. 

## 2016-08-13 NOTE — Progress Notes (Signed)
Outpatient Surgical Follow Up  08/13/2016  Kristin Valdez is an 81 y.o. female.   Chief Complaint  Patient presents with  . Routine Post Op    Right Mastectomy w Sentinel Node Biopsy-05/24/16-Dr.Loflin    HPI: 81 year old female returns to clinic now 3 months status post right mastectomy. Patient reports from a mastectomy standpoint she's been feeling very good. She denies any pain. She has been doing self exams and has not felt any new lumps or bumps. The prior areas of swelling of completely resolved. She continues to be worked up for high blood pressure. She also has not tolerated her chemotherapy preventative medication per oncology. She currently denies any fevers, chills, nausea, vomiting, chest pain, shortness of breath, diarrhea, constipation.  Past Medical History:  Diagnosis Date  . Anxiety   . Arthritis   . Breast cancer ([place]) 2014-2015   left breast cancer  . Complication of anesthesia    post nausea and vomiting  . Depression   . Numbness and tingling     Past Surgical History:  Procedure Laterality Date  . ABDOMINAL HYSTERECTOMY    . APPENDECTOMY    . BREAST BIOPSY Left    Stereotactic biopsy  . BREAST BIOPSY Right 05/02/2016   US guided biopsy  . MASTECTOMY Left 2015  . MASTECTOMY W/ SENTINEL NODE BIOPSY Right 05/24/2016   Procedure: MASTECTOMY WITH SENTINEL LYMPH NODE BIOPSY;  Surgeon: Hubbard Robinson, MD;  Location: [facility] ORS;  Service: General;  Laterality: Right;    Family History  Problem Relation Age of Onset  . Heart disease Mother   . Prostate cancer Father   . Stroke Brother   . Healthy Daughter     Social History:  reports that she quit smoking about 45 years ago. She has never used smokeless tobacco. She reports that she drinks alcohol. She reports that she does not use drugs.  Allergies:  Allergies  Allergen Reactions  . Excedrin Extra Strength [Asa-Apap-Caff Buffered] Other (See Comments)    Shaking    Medications  reviewed.    ROS A multipoint review of systems was completed. All pertinent positives and negatives are documented within the history of present illness the remainder are negative.   BP (!) 175/103   Pulse 92   Temp 98.7 F (37.1 C) (Oral)   Ht 5\' 5"  (1.651 m)   Wt 74.9 kg (165 lb 3.2 oz)   BMI 27.49 kg/m   Physical Exam Gen.: No acute distress Chest: Clear to auscultation Breast: Bilateral breast surgically absent. No palpable lesions, lumps, bumps. There is significant dog earring to the lateral aspect of the right mastectomy but no palpable concerning features. Heart: Regular rhythm Abdomen: Soft and nontender    No results found for this or any previous visit (from the past 48 hour(s)). No results found.  Assessment/Plan:  1. Personal history of malignant neoplasm of breast 81 year old female 3 months status post right mastectomy for breast cancer. Doing very well. Discussed self exams and signs and symptoms of recurrence of breast cancer. She voiced understanding. She'll follow up in 3 months for additional check.     Clayburn Pert, MD FACS General Surgeon  08/13/2016,10:06 AM

## 2016-08-13 NOTE — Progress Notes (Signed)
Offers no complaints. States is feeling well. 

## 2016-08-16 DIAGNOSIS — R030 Elevated blood-pressure reading, without diagnosis of hypertension: Secondary | ICD-10-CM | POA: Diagnosis not present

## 2016-09-05 ENCOUNTER — Ambulatory Visit: Payer: Medicare HMO | Admitting: Oncology

## 2016-09-19 ENCOUNTER — Ambulatory Visit
Admission: RE | Admit: 2016-09-19 | Discharge: 2016-09-19 | Disposition: A | Discharge status: Home / Self Care | Payer: Medicare HMO | Source: Ambulatory Visit | Attending: Oncology | Admitting: Oncology

## 2016-09-19 DIAGNOSIS — Z780 Asymptomatic menopausal state: Secondary | ICD-10-CM | POA: Diagnosis not present

## 2016-09-19 DIAGNOSIS — M8589 Other specified disorders of bone density and structure, multiple sites: Secondary | ICD-10-CM | POA: Diagnosis not present

## 2016-09-19 DIAGNOSIS — M85851 Other specified disorders of bone density and structure, right thigh: Secondary | ICD-10-CM | POA: Diagnosis not present

## 2016-09-19 DIAGNOSIS — C50411 Malignant neoplasm of upper-outer quadrant of right female breast: Secondary | ICD-10-CM | POA: Diagnosis not present

## 2016-09-21 ENCOUNTER — Encounter: Payer: Self-pay | Admitting: Family Medicine

## 2016-09-21 ENCOUNTER — Ambulatory Visit (INDEPENDENT_AMBULATORY_CARE_PROVIDER_SITE_OTHER): Payer: Medicare HMO | Admitting: Family Medicine

## 2016-09-21 DIAGNOSIS — Z0000 Encounter for general adult medical examination without abnormal findings: Secondary | ICD-10-CM

## 2016-09-21 NOTE — Patient Instructions (Signed)
Follow up annually.  Take care  Dr. Lacinda Axon   Health Maintenance, Female Adopting a healthy lifestyle and getting preventive care can go a long way to promote health and wellness. Talk with your health care provider about what schedule of regular examinations is right for you. This is a good chance for you to check in with your provider about disease prevention and staying healthy. In between checkups, there are plenty of things you can do on your own. Experts have done a lot of research about which lifestyle changes and preventive measures are most likely to keep you healthy. Ask your health care provider for more information. Weight and diet Eat a healthy diet  Be sure to include plenty of vegetables, fruits, low-fat dairy products, and lean protein.  Do not eat a lot of foods high in solid fats, added sugars, or salt.  Get regular exercise. This is one of the most important things you can do for your health.  Most adults should exercise for at least 150 minutes each week. The exercise should increase your heart rate and make you sweat (moderate-intensity exercise).  Most adults should also do strengthening exercises at least twice a week. This is in addition to the moderate-intensity exercise. Maintain a healthy weight  Body mass index (BMI) is a measurement that can be used to identify possible weight problems. It estimates body fat based on height and weight. Your health care provider can help determine your BMI and help you achieve or maintain a healthy weight.  For females 79 years of age and older:  A BMI below 18.5 is considered underweight.  A BMI of 18.5 to 24.9 is normal.  A BMI of 25 to 29.9 is considered overweight.  A BMI of 30 and above is considered obese. Watch levels of cholesterol and blood lipids  You should start having your blood tested for lipids and cholesterol at 79 years of age, then have this test every 5 years.  You may need to have your cholesterol  levels checked more often if:  Your lipid or cholesterol levels are high.  You are older than 79 years of age.  You are at high risk for heart disease. Cancer screening Lung Cancer  Lung cancer screening is recommended for adults 79-20 years old who are at high risk for lung cancer because of a history of smoking.  A yearly low-dose CT scan of the lungs is recommended for people who:  Currently smoke.  Have quit within the past 15 years.  Have at least a 30-pack-year history of smoking. A pack year is smoking an average of one pack of cigarettes a day for 1 year.  Yearly screening should continue until it has been 15 years since you quit.  Yearly screening should stop if you develop a health problem that would prevent you from having lung cancer treatment. Breast Cancer  Practice breast self-awareness. This means understanding how your breasts normally appear and feel.  It also means doing regular breast self-exams. Let your health care provider know about any changes, no matter how small.  If you are in your 79s or 30s, you should have a clinical breast exam (CBE) by a health care provider every 1-3 years as part of a regular health exam.  If you are 12 or older, have a CBE every year. Also consider having a breast X-ray (mammogram) every year.  If you have a family history of breast cancer, talk to your health care provider about genetic screening.  If you are at high risk for breast cancer, talk to your health care provider about having an MRI and a mammogram every year.  Breast cancer gene (BRCA) assessment is recommended for women who have family members with BRCA-related cancers. BRCA-related cancers include:  Breast.  Ovarian.  Tubal.  Peritoneal cancers.  Results of the assessment will determine the need for genetic counseling and BRCA1 and BRCA2 testing. Cervical Cancer  Your health care provider may recommend that you be screened regularly for cancer of the  pelvic organs (ovaries, uterus, and vagina). This screening involves a pelvic examination, including checking for microscopic changes to the surface of your cervix (Pap test). You may be encouraged to have this screening done every 3 years, beginning at age 79.  For women ages 30-65, health care providers may recommend pelvic exams and Pap testing every 3 years, or they may recommend the Pap and pelvic exam, combined with testing for human papilloma virus (HPV), every 5 years. Some types of HPV increase your risk of cervical cancer. Testing for HPV may also be done on women of any age with unclear Pap test results.  Other health care providers may not recommend any screening for nonpregnant women who are considered low risk for pelvic cancer and who do not have symptoms. Ask your health care provider if a screening pelvic exam is right for you.  If you have had past treatment for cervical cancer or a condition that could lead to cancer, you need Pap tests and screening for cancer for at least 20 years after your treatment. If Pap tests have been discontinued, your risk factors (such as having a new sexual partner) need to be reassessed to determine if screening should resume. Some women have medical problems that increase the chance of getting cervical cancer. In these cases, your health care provider may recommend more frequent screening and Pap tests. Colorectal Cancer  This type of cancer can be detected and often prevented.  Routine colorectal cancer screening usually begins at 79 years of age and continues through 79 years of age.  Your health care provider may recommend screening at an earlier age if you have risk factors for colon cancer.  Your health care provider may also recommend using home test kits to check for hidden blood in the stool.  A small camera at the end of a tube can be used to examine your colon directly (sigmoidoscopy or colonoscopy). This is done to check for the earliest  forms of colorectal cancer.  Routine screening usually begins at age 79.  Direct examination of the colon should be repeated every 5-10 years through 79 years of age. However, you may need to be screened more often if early forms of precancerous polyps or small growths are found. Skin Cancer  Check your skin from head to toe regularly.  Tell your health care provider about any new moles or changes in moles, especially if there is a change in a mole's shape or color.  Also tell your health care provider if you have a mole that is larger than the size of a pencil eraser.  Always use sunscreen. Apply sunscreen liberally and repeatedly throughout the day.  Protect yourself by wearing long sleeves, pants, a wide-brimmed hat, and sunglasses whenever you are outside. Heart disease, diabetes, and high blood pressure  High blood pressure causes heart disease and increases the risk of stroke. High blood pressure is more likely to develop in:  People who have blood pressure in the   high end of the normal range (130-139/85-89 mm Hg).  People who are overweight or obese.  People who are African American.  If you are 18-39 years of age, have your blood pressure checked every 3-5 years. If you are 40 years of age or older, have your blood pressure checked every year. You should have your blood pressure measured twice-once when you are at a hospital or clinic, and once when you are not at a hospital or clinic. Record the average of the two measurements. To check your blood pressure when you are not at a hospital or clinic, you can use:  An automated blood pressure machine at a pharmacy.  A home blood pressure monitor.  If you are between 55 years and 79 years old, ask your health care provider if you should take aspirin to prevent strokes.  Have regular diabetes screenings. This involves taking a blood sample to check your fasting blood sugar level.  If you are at a normal weight and have a low  risk for diabetes, have this test once every three years after 79 years of age.  If you are overweight and have a high risk for diabetes, consider being tested at a younger age or more often. Preventing infection Hepatitis B  If you have a higher risk for hepatitis B, you should be screened for this virus. You are considered at high risk for hepatitis B if:  You were born in a country where hepatitis B is common. Ask your health care provider which countries are considered high risk.  Your parents were born in a high-risk country, and you have not been immunized against hepatitis B (hepatitis B vaccine).  You have HIV or AIDS.  You use needles to inject street drugs.  You live with someone who has hepatitis B.  You have had sex with someone who has hepatitis B.  You get hemodialysis treatment.  You take certain medicines for conditions, including cancer, organ transplantation, and autoimmune conditions. Hepatitis C  Blood testing is recommended for:  Everyone born from 1945 through 1965.  Anyone with known risk factors for hepatitis C. Sexually transmitted infections (STIs)  You should be screened for sexually transmitted infections (STIs) including gonorrhea and chlamydia if:  You are sexually active and are younger than 79 years of age.  You are older than 79 years of age and your health care provider tells you that you are at risk for this type of infection.  Your sexual activity has changed since you were last screened and you are at an increased risk for chlamydia or gonorrhea. Ask your health care provider if you are at risk.  If you do not have HIV, but are at risk, it may be recommended that you take a prescription medicine daily to prevent HIV infection. This is called pre-exposure prophylaxis (PrEP). You are considered at risk if:  You are sexually active and do not regularly use condoms or know the HIV status of your partner(s).  You take drugs by  injection.  You are sexually active with a partner who has HIV. Talk with your health care provider about whether you are at high risk of being infected with HIV. If you choose to begin PrEP, you should first be tested for HIV. You should then be tested every 3 months for as long as you are taking PrEP. Pregnancy  If you are premenopausal and you may become pregnant, ask your health care provider about preconception counseling.  If you may become   pregnant, take 400 to 800 micrograms (mcg) of folic acid every day.  If you want to prevent pregnancy, talk to your health care provider about birth control (contraception). Osteoporosis and menopause  Osteoporosis is a disease in which the bones lose minerals and strength with aging. This can result in serious bone fractures. Your risk for osteoporosis can be identified using a bone density scan.  If you are 1 years of age or older, or if you are at risk for osteoporosis and fractures, ask your health care provider if you should be screened.  Ask your health care provider whether you should take a calcium or vitamin D supplement to lower your risk for osteoporosis.  Menopause may have certain physical symptoms and risks.  Hormone replacement therapy may reduce some of these symptoms and risks. Talk to your health care provider about whether hormone replacement therapy is right for you. Follow these instructions at home:  Schedule regular health, dental, and eye exams.  Stay current with your immunizations.  Do not use any tobacco products including cigarettes, chewing tobacco, or electronic cigarettes.  If you are pregnant, do not drink alcohol.  If you are breastfeeding, limit how much and how often you drink alcohol.  Limit alcohol intake to no more than 1 drink per day for nonpregnant women. One drink equals 12 ounces of beer, 5 ounces of wine, or 1 ounces of hard liquor.  Do not use street drugs.  Do not share needles.  Ask  your health care provider for help if you need support or information about quitting drugs.  Tell your health care provider if you often feel depressed.  Tell your health care provider if you have ever been abused or do not feel safe at home. This information is not intended to replace advice given to you by your health care provider. Make sure you discuss any questions you have with your health care provider. Document Released: 11/27/2010 Document Revised: 10/20/2015 Document Reviewed: 02/15/2015 Elsevier Interactive Patient Education  2017 [employer].

## 2016-09-21 NOTE — Assessment & Plan Note (Signed)
Declines immunizations. No indication for pap, colonoscopy or mammogram. Doing well at this time. No complaints. Dexa revealed osteopenia with risk of hip fracture >3%. Patient declines treatment. Follow up annually.

## 2016-09-21 NOTE — Progress Notes (Signed)
Pre visit review using our clinic review tool, if applicable. No additional management support is needed unless otherwise documented below in the visit note. 

## 2016-09-21 NOTE — Progress Notes (Signed)
Subjective:  Patient ID: Kristin Valdez, female    DOB: Oct 03, 1935  Age: 81 y.o. MRN: [id number]  CC: Establish care/physical  HPI Kristin Valdez is a 81 y.o. female presents to the clinic today to establish care.  Preventative Healthcare  Pap smear: Not indicated; s/p hysterectomy (and age).  Mammogram: No longer needed (s/p mastectomy).  Colonoscopy: No longer needed.  Immunizations  Tetanus - Up to date.  Pneumococcal - Declines.  Labs: Will wait on labs.  Alcohol use: Occ.  Smoking/tobacco use: Former.  PMH, Surgical Hx, Family Hx, Social History reviewed and updated as below.  Past Medical History:  Diagnosis Date  . Breast cancer ([place]) 2014-2015   left breast cancer  . Chickenpox    Past Surgical History:  Procedure Laterality Date  . ABDOMINAL HYSTERECTOMY    . APPENDECTOMY    . BREAST BIOPSY Left    Stereotactic biopsy  . BREAST BIOPSY Right 05/02/2016   US guided biopsy  . MASTECTOMY Left 2015  . MASTECTOMY W/ SENTINEL NODE BIOPSY Right 05/24/2016   Procedure: MASTECTOMY WITH SENTINEL LYMPH NODE BIOPSY;  Surgeon: Hubbard Robinson, MD;  Location: [facility] ORS;  Service: General;  Laterality: Right;   Family History  Problem Relation Age of Onset  . Heart disease Mother   . Prostate cancer Father   . Stroke Brother   . Healthy Daughter    Social History  Substance Use Topics  . Smoking status: Former Smoker    Quit date: 01/09/1971  . Smokeless tobacco: Never Used  . Alcohol use Yes     Comment: Occasional   Review of Systems General: Denies unexplained weight loss, fever. Skin: Denies new or changing mole, sore/wound that won't heal. ENT: Trouble hearing, ringing in the ears, sores in the mouth, hoarseness, trouble swallowing. Eyes: Denies trouble seeing/visual disturbance. Heart/CV: Denies chest pain, shortness of breath, edema, palpitations. Lungs/Resp: Denies cough, shortness of breath, hemoptysis. Abd/GI: Denies nausea, vomiting, diarrhea,  constipation, abdominal pain, hematochezia, melena. GU: Denies dysuria, incontinence, hematuria, urinary frequency, difficulty starting/keeping stream, vaginal discharge, sexual difficulty, lump in breasts. MSK: Denies joint pain/swelling, myalgias. Neuro: Denies headaches, weakness, numbness, dizziness, syncope. Psych: Denies sadness, anxiety, stress, memory difficulty. Endocrine: Denies polyuria and polydipsia.  Objective:   Today's Vitals: BP 126/82   Pulse 95   Temp 98.6 F (37 C) (Oral)   Ht 5' 3.5" (1.613 m)   Wt 166 lb (75.3 kg)   SpO2 96%   BMI 28.94 kg/m   Physical Exam  Constitutional: She is oriented to person, place, and time. She appears well-developed. No distress.  HENT:  Head: Normocephalic and atraumatic.  Mouth/Throat: Oropharynx is clear and moist.  Eyes: Conjunctivae are normal. No scleral icterus.  Neck: Neck supple.  Cardiovascular: Normal rate and regular rhythm.   Pulmonary/Chest: Effort normal and breath sounds normal.  Abdominal: Soft. She exhibits no distension. There is no tenderness. There is no rebound and no guarding.  Musculoskeletal: Normal range of motion.  Neurological: She is alert and oriented to person, place, and time.  Skin: No rash noted.  Psychiatric: She has a normal mood and affect.  Vitals reviewed.  Assessment & Plan:   Problem List Items Addressed This Visit    Annual physical exam    Declines immunizations. No indication for pap, colonoscopy or mammogram. Doing well at this time. No complaints. Dexa revealed osteopenia with risk of hip fracture >3%. Patient declines treatment. Follow up annually.  Follow-up: Annually.   [place]

## 2016-11-08 DIAGNOSIS — C50919 Malignant neoplasm of unspecified site of unspecified female breast: Secondary | ICD-10-CM

## 2016-11-08 HISTORY — DX: Malignant neoplasm of unspecified site of unspecified female breast: C50.919

## 2016-11-14 ENCOUNTER — Ambulatory Visit: Payer: Medicare HMO | Admitting: Oncology

## 2016-11-14 ENCOUNTER — Encounter: Payer: Self-pay | Admitting: General Surgery

## 2016-11-14 ENCOUNTER — Ambulatory Visit (INDEPENDENT_AMBULATORY_CARE_PROVIDER_SITE_OTHER): Payer: Medicare HMO | Admitting: General Surgery

## 2016-11-14 DIAGNOSIS — Z853 Personal history of malignant neoplasm of breast: Secondary | ICD-10-CM | POA: Diagnosis not present

## 2016-11-14 NOTE — Progress Notes (Signed)
Outpatient Surgical Follow Up  11/14/2016  Kristin Valdez is an 81 y.o. female.   Chief Complaint  Patient presents with  . Routine Post Op    Right Mastectomy w/Sentinel Node Biopsy-05/24/16-Dr.Loflin    HPI: 81 year old female returns to clinic now for a 6 month post mastectomy visit. She reports her mastectomy standpoint she's feeling very good. She denies any pain. She has been performing self exams and does not feel any new lumps or bumps. Her only complaint today is of it being too hot outside. She currently denies any fevers, chills, nausea, vomiting, chest pain, shortness breath, diarrhea, constipation. She has felt no new lumps. She does occasionally have some irritation to the extra tissue left from the dog ears of her scars.  Past Medical History:  Diagnosis Date  . Breast cancer ([place]) 2014-2015   left breast cancer  . Chickenpox     Past Surgical History:  Procedure Laterality Date  . ABDOMINAL HYSTERECTOMY    . APPENDECTOMY    . BREAST BIOPSY Left    Stereotactic biopsy  . BREAST BIOPSY Right 05/02/2016   US guided biopsy  . MASTECTOMY Left 2015  . MASTECTOMY W/ SENTINEL NODE BIOPSY Right 05/24/2016   Procedure: MASTECTOMY WITH SENTINEL LYMPH NODE BIOPSY;  Surgeon: Hubbard Robinson, MD;  Location: [facility] ORS;  Service: General;  Laterality: Right;    Family History  Problem Relation Age of Onset  . Heart disease Mother   . Prostate cancer Father   . Stroke Brother   . Healthy Daughter     Social History:  reports that she quit smoking about 45 years ago. She has never used smokeless tobacco. She reports that she drinks alcohol. She reports that she does not use drugs.  Allergies:  Allergies  Allergen Reactions  . Excedrin Extra Strength [Asa-Apap-Caff Buffered] Other (See Comments)    Shaking    Medications reviewed.    ROS A multipoint review of systems was completed, all pertinent positives and negatives are documented within the history of present  illness the remainder are negative   BP (!) 154/88   Pulse 83   Temp 98.2 F (36.8 C) (Oral)   Ht 5\' 3"  (1.6 m)   Wt 75 kg (165 lb 6.4 oz)   BMI 29.30 kg/m   Physical Exam Gen.: No acute distress Neck: Supple and nontender Lymph nodes: No evidence of cervical, clavicular, axillary lymphadenopathy Breasts: Bilateral breasts are surgically absent with well-healed scars. There no evidence of palpable masses or concerning lumps. Dogears present to the medial aspect of both mastectomy incisions and the lateral aspect of the right. There is also a sunken in aspect of the superior portion of the right mastectomy flap site. The skin has well-healed over this. No palpable fluid collections. Chest: Clear to auscultation Heart: Regular rate and rhythm Abdomen: Soft and nontender    No results found for this or any previous visit (from the past 48 hour(s)). No results found.  Assessment/Plan:  1. Personal history of malignant neoplasm of breast 81 year old female 6 months status post right mastectomy for breast cancer. She is 4 years status post left mastectomy for breast cancer. Both sites are healing well. Discussed in detail the signs and symptoms of recurrence and to report to clinic immediately should they occur. Also discussed that the scars will continue to evolve for up to a year after surgery. Discussed the options of scar revisions and patient states she will think about it but currently doesn't think  she is interested. She'll follow-up in clinic in additional 6 months for 1 year follow-up from her surgery date.  A total of 15 minutes was used on this encounter with greater than 50% of it used for counseling and coordination of care.     Clayburn Pert, MD FACS General Surgeon  11/14/2016,9:03 AM

## 2016-11-14 NOTE — Patient Instructions (Signed)
Please call our office with any questions or concerns you may have. Please see your follow up appointment listed below.

## 2016-11-21 DIAGNOSIS — H2513 Age-related nuclear cataract, bilateral: Secondary | ICD-10-CM | POA: Diagnosis not present

## 2016-11-21 DIAGNOSIS — H02059 Trichiasis without entropian unspecified eye, unspecified eyelid: Secondary | ICD-10-CM | POA: Diagnosis not present

## 2016-11-21 DIAGNOSIS — H40039 Anatomical narrow angle, unspecified eye: Secondary | ICD-10-CM | POA: Diagnosis not present

## 2016-11-29 ENCOUNTER — Ambulatory Visit (INDEPENDENT_AMBULATORY_CARE_PROVIDER_SITE_OTHER): Payer: Medicare HMO | Admitting: Family Medicine

## 2016-11-29 ENCOUNTER — Ambulatory Visit (INDEPENDENT_AMBULATORY_CARE_PROVIDER_SITE_OTHER): Payer: Medicare HMO

## 2016-11-29 ENCOUNTER — Encounter: Payer: Self-pay | Admitting: Family Medicine

## 2016-11-29 VITALS — BP 126/86 | HR 82 | Temp 98.3°F | Resp 16 | Ht 63.0 in | Wt 165.6 lb

## 2016-11-29 DIAGNOSIS — M79644 Pain in right finger(s): Secondary | ICD-10-CM | POA: Diagnosis not present

## 2016-11-29 DIAGNOSIS — M19041 Primary osteoarthritis, right hand: Secondary | ICD-10-CM | POA: Diagnosis not present

## 2016-11-29 NOTE — Progress Notes (Signed)
   Subjective:  Patient ID: Kristin Valdez, female    DOB: May 28, 1936  Age: 81 y.o. MRN: [id number]  CC: Thumb injury  HPI:  81 year old female presents with the above complaint.  Patient reports that she slipped out of a chair and fell approximately 1 month. Fell on outstretched hand. Reports that she injured her thumb (R). Developed pain afterwards and has had continued difficulty. Thumb is painful. Moderate in severity. Reports swelling as well. Worse with ROM. No relieving factors. No other complaints or concerns at this time.  Social Hx   Social History   Social History  . Marital status: Widowed    Spouse name: N/A  . Number of children: N/A  . Years of education: N/A   Social History Main Topics  . Smoking status: Former Smoker    Quit date: 01/09/1971  . Smokeless tobacco: Never Used  . Alcohol use Yes     Comment: Occasional  . Drug use: No  . Sexual activity: No   Other Topics Concern  . None   Social History Narrative  . None    Review of Systems  Constitutional: Negative.   Musculoskeletal:       Thumb pain & swelling (R).    Objective:  BP 126/86   Pulse 82   Temp 98.3 F (36.8 C) (Oral)   Resp 16   Ht 5\' 3"  (1.6 m)   Wt 165 lb 9.6 oz (75.1 kg)   SpO2 96%   BMI 29.33 kg/m   BP/Weight 11/29/2016 11/14/2016 2/40/9735  Systolic BP 329 924 268  Diastolic BP 86 88 82  Wt. (Lbs) 165.6 165.4 166  BMI 29.33 29.3 28.94    Physical Exam  Constitutional: She is oriented to person, place, and time. She appears well-developed. No distress.  Pulmonary/Chest: Effort normal. No respiratory distress.  Musculoskeletal:  Right hand - R thumb with tenderness at the IP joint. Mild swelling. Normal ROM.  Neurological: She is alert and oriented to person, place, and time.  Psychiatric: She has a normal mood and affect.  Vitals reviewed.   Lab Results  Component Value Date   WBC 9.1 05/17/2016   HGB 13.9 05/17/2016   HCT 41.7 05/17/2016   PLT 237 05/17/2016     GLUCOSE 93 05/17/2016   ALT 21 05/17/2016   AST 21 05/17/2016   NA 140 05/17/2016   K 3.8 05/17/2016   CL 104 05/17/2016   CREATININE 0.87 05/17/2016   BUN 19 05/17/2016   CO2 28 05/17/2016    Assessment & Plan:   Problem List Items Addressed This Visit      Other   Pain of right thumb - Primary    New problem. Xray negative. Supportive care with RICE and OTC Tylenol/NSAIDs.      Relevant Orders   DG Finger Thumb Right (Completed)      Meds ordered this encounter  Medications  . clobetasol cream (TEMOVATE) 0.05 %    Sig: Apply 1 application topically 2 (two) times daily.     Follow-up: PRN  [place]

## 2016-11-29 NOTE — Patient Instructions (Signed)
We will call with the results.  Take care  Dr. Ezio Wieck  

## 2016-11-29 NOTE — Assessment & Plan Note (Signed)
New problem. Xray negative. Supportive care with RICE and OTC Tylenol/NSAIDs.

## 2016-12-03 ENCOUNTER — Other Ambulatory Visit: Payer: Self-pay | Admitting: Family Medicine

## 2016-12-03 MED ORDER — CLOBETASOL PROPIONATE 0.05 % EX CREA: 1.0000 "application " | TOPICAL_CREAM | Freq: Two times a day (BID) | CUTANEOUS | 0 refills | Status: DC

## 2016-12-03 NOTE — Telephone Encounter (Signed)
Last office visit 09/21/16 Next office visit 09/23/16 We've never filled before

## 2016-12-03 NOTE — Telephone Encounter (Signed)
Kristin Valdez requested a have this refilled, pt has been to the pharmacy to pick up this Rx. Pharmacy Contact [phone]

## 2016-12-03 NOTE — Telephone Encounter (Signed)
Pt called requesting a refill on her clobetasol cream (TEMOVATE) 0.05 %. Please advise, thank you!  [place], [place].

## 2017-01-11 ENCOUNTER — Telehealth: Payer: Self-pay | Admitting: Family Medicine

## 2017-01-11 NOTE — Telephone Encounter (Signed)
Left pt message asking to call Allison back directly at [phone] to schedule AWV. Thanks! °  °*NOTE* Never had AWV °

## 2017-02-15 NOTE — Telephone Encounter (Signed)
Left pt message asking to call Ebony Hail back directly at [phone] to schedule AWV. Thanks!  *NOTE* Never had AWV

## 2017-05-20 ENCOUNTER — Ambulatory Visit: Payer: Medicare HMO | Admitting: General Surgery

## 2017-05-23 ENCOUNTER — Ambulatory Visit: Payer: Medicare HMO | Admitting: General Surgery

## 2017-05-23 ENCOUNTER — Encounter: Payer: Self-pay | Admitting: General Surgery

## 2017-05-23 VITALS — BP 167/102 | HR 87 | Temp 98.7°F | Wt 163.0 lb

## 2017-05-23 DIAGNOSIS — H2513 Age-related nuclear cataract, bilateral: Secondary | ICD-10-CM | POA: Diagnosis not present

## 2017-05-23 DIAGNOSIS — Z853 Personal history of malignant neoplasm of breast: Secondary | ICD-10-CM | POA: Diagnosis not present

## 2017-05-23 DIAGNOSIS — H02059 Trichiasis without entropian unspecified eye, unspecified eyelid: Secondary | ICD-10-CM | POA: Diagnosis not present

## 2017-05-23 DIAGNOSIS — H40033 Anatomical narrow angle, bilateral: Secondary | ICD-10-CM | POA: Diagnosis not present

## 2017-05-23 NOTE — Progress Notes (Signed)
Outpatient Surgical Follow Up  05/23/2017  Kristin Valdez is an 81 y.o. female.   No chief complaint on file.   HPI: 81 year old female returns to clinic for a one-year follow-up of her bilateral mastectomies.  Patient reports feeling well.  She has been performing self exams and denies any new findings.  She denies any fevers, chills, nausea, vomiting, chest pain, shortness of breath, diarrhea, constipation.  She still has some occasional discomfort from the bilateral dogeared incisions but states that it does not bother her that much.  She is otherwise in her usual state of good health.  Past Medical History:  Diagnosis Date  . Breast cancer ([place]) 2014-2015   left breast cancer  . Chickenpox     Past Surgical History:  Procedure Laterality Date  . ABDOMINAL HYSTERECTOMY    . APPENDECTOMY    . BREAST BIOPSY Left    Stereotactic biopsy  . BREAST BIOPSY Right 05/02/2016   US guided biopsy  . MASTECTOMY Left 2015  . MASTECTOMY W/ SENTINEL NODE BIOPSY Right 05/24/2016   Procedure: MASTECTOMY WITH SENTINEL LYMPH NODE BIOPSY;  Surgeon: Hubbard Robinson, MD;  Location: [facility] ORS;  Service: General;  Laterality: Right;    Family History  Problem Relation Age of Onset  . Heart disease Mother   . Prostate cancer Father   . Stroke Brother   . Healthy Daughter     Social History:  reports that she quit smoking about 46 years ago. she has never used smokeless tobacco. She reports that she drinks alcohol. She reports that she does not use drugs.  Allergies:  Allergies  Allergen Reactions  . Excedrin Extra Strength [Asa-Apap-Caff Buffered] Other (See Comments)    Shaking    Medications reviewed.    ROS A multipoint review of systems was completed, all pertinent positives and negatives are documented within the HPI and the remainder are negative.   BP (!) 167/102   Pulse 87   Temp 98.7 F (37.1 C) (Oral)   Wt 73.9 kg (163 lb)   BMI 28.87 kg/m   Physical Exam General:  No acute distress Neck: Supple nontender Chest: Clear to auscultation Breast: Bilateral breasts are surgically absent.  No palpable masses, lumps, areas of concern.  Bilateral dog ears without skin irritation. Heart: Regular rate and rhythm Abdomen: Soft and nontender    No results found for this or any previous visit (from the past 48 hour(s)). No results found.  Assessment/Plan:  1. Personal history of malignant neoplasm of breast 81 year old female 1 year status post bilateral mastectomies.  Is doing very well.  Discussed again the signs and symptoms of local recurrence and to seek follow-up immediately.  Otherwise discussed with the patient the importance of annual clinical exams.  Patient voiced understanding and will follow-up in 1 year for repeat exam.     Clayburn Pert, MD [facility] General Surgeon  05/23/2017,5:42 PM

## 2017-05-23 NOTE — Patient Instructions (Signed)
Please give [place] a call in case you notice any changes on your chest wall.  Please remember to get checked in a year, either by [place] or your primary care doctor.

## 2017-06-12 DIAGNOSIS — H5711 Ocular pain, right eye: Secondary | ICD-10-CM | POA: Diagnosis not present

## 2017-06-12 DIAGNOSIS — H02059 Trichiasis without entropian unspecified eye, unspecified eyelid: Secondary | ICD-10-CM | POA: Diagnosis not present

## 2017-06-24 ENCOUNTER — Ambulatory Visit (INDEPENDENT_AMBULATORY_CARE_PROVIDER_SITE_OTHER): Payer: Medicare HMO

## 2017-06-24 VITALS — BP 110/70 | HR 80 | Temp 98.1°F | Resp 14 | Ht 63.0 in | Wt 162.1 lb

## 2017-06-24 DIAGNOSIS — Z0000 Encounter for general adult medical examination without abnormal findings: Secondary | ICD-10-CM

## 2017-06-24 NOTE — Progress Notes (Signed)
Subjective:   Kristin Valdez is a 82 y.o. female who presents for Medicare Annual (Initial) preventive examination.  Review of Systems:  No ROS.  Medicare Wellness Visit. Additional risk factors are reflected in the social history.  Cardiac Risk Factors include: advanced age (>72men, >38 women);hypertension     Objective:     Vitals: BP 110/70 (BP Location: Left Wrist, Patient Position: Sitting, Cuff Size: Normal)   Pulse 80   Temp 98.1 F (36.7 C) (Oral)   Resp 14   Ht 5\' 3"  (1.6 m)   Wt 162 lb 1.9 oz (73.5 kg)   SpO2 98%   BMI 28.72 kg/m   Body mass index is 28.72 kg/m.  Advanced Directives 06/24/2017 11/29/2016 08/13/2016 06/07/2016 05/24/2016 05/24/2016 05/17/2016  Does Patient Have a Medical Advance Directive? Yes No Yes Yes No;Yes - Yes  Type of Paramedic of [place];Living will - [place];Living will Living will;Healthcare Power of Attorney Living will Living will Living will  Does patient want to make changes to medical advance directive? No - Patient declined - - - No - Patient declined - -  Copy of [place] in Chart? No - copy requested - No - copy requested - - - -    Tobacco Social History   Tobacco Use  Smoking Status Former Smoker  . Last attempt to quit: 01/09/1971  . Years since quitting: 46.4  Smokeless Tobacco Never Used     Counseling given: Not Answered   Clinical Intake:  Pre-visit preparation completed: Yes  Pain : No/denies pain     Nutritional Status: BMI 25 -29 Overweight Diabetes: No  How often do you need to have someone help you when you read instructions, pamphlets, or other written materials from your doctor or pharmacy?: 1 - Never  Interpreter Needed?: No     Past Medical History:  Diagnosis Date  . Breast cancer ([place]) 2014-2015   left breast cancer  . Chickenpox    Past Surgical History:  Procedure Laterality Date  . ABDOMINAL HYSTERECTOMY    .  APPENDECTOMY    . BREAST BIOPSY Left    Stereotactic biopsy  . BREAST BIOPSY Right 05/02/2016   US guided biopsy  . MASTECTOMY Left 2015  . MASTECTOMY W/ SENTINEL NODE BIOPSY Right 05/24/2016   Procedure: MASTECTOMY WITH SENTINEL LYMPH NODE BIOPSY;  Surgeon: Hubbard Robinson, MD;  Location: [facility] ORS;  Service: General;  Laterality: Right;   Family History  Problem Relation Age of Onset  . Heart disease Mother   . Prostate cancer Father   . Stroke Brother   . Healthy Daughter    Social History   Socioeconomic History  . Marital status: Widowed    Spouse name: None  . Number of children: None  . Years of education: None  . Highest education level: None  Social Needs  . Financial resource strain: Not hard at all  . Food insecurity - worry: Never true  . Food insecurity - inability: Never true  . Transportation needs - medical: No  . Transportation needs - non-medical: No  Occupational History  . None  Tobacco Use  . Smoking status: Former Smoker    Last attempt to quit: 01/09/1971    Years since quitting: 46.4  . Smokeless tobacco: Never Used  Substance and Sexual Activity  . Alcohol use: Yes    Comment: Occasional  . Drug use: No  . Sexual activity: No  Other Topics Concern  .  None  Social History Narrative   Works 2-3 days a week at [employer].    Outpatient Encounter Medications as of 06/24/2017  Medication Sig  . acetaminophen (TYLENOL) 325 MG tablet Take 650 mg by mouth every 6 (six) hours as needed for moderate pain.  . clobetasol cream (TEMOVATE) 5.03 % Apply 1 application topically 2 (two) times daily.   No facility-administered encounter medications on file as of 06/24/2017.     Activities of Daily Living In your present state of health, do you have any difficulty performing the following activities: 06/24/2017  Hearing? N  Vision? N  Difficulty concentrating or making decisions? N  Walking or climbing stairs? N  Dressing or bathing? N  Doing  errands, shopping? N  Preparing Food and eating ? N  Using the Toilet? N  In the past six months, have you accidently leaked urine? N  Do you have problems with loss of bowel control? N  Managing your Medications? N  Managing your Finances? N  Housekeeping or managing your Housekeeping? N  Some recent data might be hidden    Patient Care Team: Leone Haven, MD as PCP - General (Family Medicine)    Assessment:   This is a routine wellness examination for Kristin Valdez. The goal of the wellness visit is to assist the patient how to close the gaps in care and create a preventative care plan for the patient.   The roster of all physicians providing medical care to patient is listed in the Snapshot section of the chart.  Osteoporosis risk reviewed.    Safety issues reviewed; Smoke and carbon monoxide detectors in the home. No firearms in the home. Wears seatbelts when driving or riding with others. Patient does wear sunscreen or protective clothing when in direct sunlight. No violence in the home.  Patient is alert, normal appearance, oriented to person/place/and time. Correctly identified the president of the [place], recall of 3/3 words, and performing simple calculations. Displays appropriate judgement and can read correct time from watch face.   No new identified risk were noted.  No failures at ADL's or IADL's.   BMI- discussed the importance of a healthy diet, water intake and the benefits of aerobic exercise. Educational material provided.   24 hour diet recall: Low carb diet  Daily fluid intake: 2 cups of caffeine, 6-8 cups of water  Dental- every 12 months.  Dr. Atilano Median.  Eye- Visual acuity not assessed per patient preference since they have regular follow up with the ophthalmologist.  Wears corrective lenses.  Sleep patterns- Sleeps 8-9 hours at night.  Wakes feeling rested.  Influenza vaccine declined.   Health maintenance gaps- closed.   Patient Concerns: None at this  time. Follow up with PCP as needed.  Exercise Activities and Dietary recommendations Current Exercise Habits: Home exercise routine, Type of exercise: walking, Time (Minutes): 30, Frequency (Times/Week): 3, Weekly Exercise (Minutes/Week): 90, Intensity: Mild  Goals    . Increase physical activity     Walk for exercise  Stay hydrated  Low carb diet       Fall Risk Fall Risk  06/24/2017 11/29/2016 09/21/2016  Falls in the past year? No Yes No  Number falls in past yr: - 1 -  Injury with Fall? - Yes -  Follow up - Falls evaluation completed -   Depression Screen PHQ 2/9 Scores 06/24/2017 11/29/2016  PHQ - 2 Score 0 0  Exception Documentation - Patient refusal     Cognitive Function  6CIT Screen 06/24/2017  What Year? 0 points  What month? 0 points  What time? 0 points  Count back from 20 0 points  Months in reverse 0 points  Repeat phrase 0 points  Total Score 0    Immunization History  Administered Date(s) Administered  . Tdap 10/29/2013   Screening Tests Health Maintenance  Topic Date Due  . TETANUS/TDAP  10/30/2023  . DEXA SCAN  Completed  . INFLUENZA VACCINE  Discontinued      Plan:    End of life planning; Advance aging; Advanced directives discussed. Copy of current HCPOA/Living Will requested.    I have personally reviewed and noted the following in the patient's chart:   . Medical and social history . Use of alcohol, tobacco or illicit drugs  . Current medications and supplements . Functional ability and status . Nutritional status . Physical activity . Advanced directives . List of other physicians . Hospitalizations, surgeries, and ER visits in previous 12 months . Vitals . Screenings to include cognitive, depression, and falls . Referrals and appointments  In addition, I have reviewed and discussed with patient certain preventive protocols, quality metrics, and best practice recommendations. A written personalized care plan for preventive  services as well as general preventive health recommendations were provided to patient.     Varney Biles, LPN  3/76/2831

## 2017-06-24 NOTE — Patient Instructions (Addendum)
  Ms. Hanisch , Thank you for taking time to come for your Medicare Wellness Visit. I appreciate your ongoing commitment to your health goals. Please review the following plan we discussed and let me know if I can assist you in the future.   Follow up with Dr. Caryl Bis as needed.    Bring a copy of your [place] and/or Living Will to be scanned into chart.  Have a great day!  These are the goals we discussed: Goals    . Increase physical activity     Walk for exercise  Stay hydrated  Low carb diet       This is a list of the screening recommended for you and due dates:  Health Maintenance  Topic Date Due  . Tetanus Vaccine  10/30/2023  . DEXA scan (bone density measurement)  Completed  . Flu Shot  Discontinued

## 2017-06-27 DIAGNOSIS — H40033 Anatomical narrow angle, bilateral: Secondary | ICD-10-CM | POA: Diagnosis not present

## 2017-06-27 DIAGNOSIS — H40031 Anatomical narrow angle, right eye: Secondary | ICD-10-CM | POA: Diagnosis not present

## 2017-06-27 DIAGNOSIS — H40013 Open angle with borderline findings, low risk, bilateral: Secondary | ICD-10-CM | POA: Diagnosis not present

## 2017-06-27 DIAGNOSIS — H40011 Open angle with borderline findings, low risk, right eye: Secondary | ICD-10-CM | POA: Diagnosis not present

## 2017-06-27 DIAGNOSIS — H02012 Cicatricial entropion of right lower eyelid: Secondary | ICD-10-CM | POA: Diagnosis not present

## 2017-06-27 DIAGNOSIS — H40053 Ocular hypertension, bilateral: Secondary | ICD-10-CM | POA: Diagnosis not present

## 2017-06-28 DIAGNOSIS — H02003 Unspecified entropion of right eye, unspecified eyelid: Secondary | ICD-10-CM | POA: Diagnosis not present

## 2017-06-28 DIAGNOSIS — H40039 Anatomical narrow angle, unspecified eye: Secondary | ICD-10-CM | POA: Diagnosis not present

## 2017-06-28 DIAGNOSIS — H02009 Unspecified entropion of unspecified eye, unspecified eyelid: Secondary | ICD-10-CM | POA: Diagnosis not present

## 2017-07-11 DIAGNOSIS — H40033 Anatomical narrow angle, bilateral: Secondary | ICD-10-CM | POA: Diagnosis not present

## 2017-07-19 DIAGNOSIS — H02009 Unspecified entropion of unspecified eye, unspecified eyelid: Secondary | ICD-10-CM | POA: Diagnosis not present

## 2017-07-19 DIAGNOSIS — H40039 Anatomical narrow angle, unspecified eye: Secondary | ICD-10-CM | POA: Diagnosis not present

## 2017-08-08 DIAGNOSIS — H02012 Cicatricial entropion of right lower eyelid: Secondary | ICD-10-CM | POA: Diagnosis not present

## 2017-08-08 DIAGNOSIS — H11231 Symblepharon, right eye: Secondary | ICD-10-CM | POA: Diagnosis not present

## 2017-08-08 DIAGNOSIS — H02032 Senile entropion of right lower eyelid: Secondary | ICD-10-CM | POA: Diagnosis not present

## 2017-08-08 DIAGNOSIS — H02532 Eyelid retraction right lower eyelid: Secondary | ICD-10-CM | POA: Diagnosis not present

## 2017-08-08 DIAGNOSIS — H02052 Trichiasis without entropian right lower eyelid: Secondary | ICD-10-CM | POA: Diagnosis not present

## 2017-08-08 DIAGNOSIS — H02539 Eyelid retraction unspecified eye, unspecified lid: Secondary | ICD-10-CM | POA: Diagnosis not present

## 2017-08-08 DIAGNOSIS — H16141 Punctate keratitis, right eye: Secondary | ICD-10-CM | POA: Diagnosis not present

## 2017-09-23 ENCOUNTER — Other Ambulatory Visit: Payer: Self-pay

## 2017-09-23 ENCOUNTER — Ambulatory Visit: Payer: Medicare HMO | Admitting: Family Medicine

## 2017-09-23 ENCOUNTER — Encounter: Payer: Self-pay | Admitting: Family Medicine

## 2017-09-23 ENCOUNTER — Ambulatory Visit (INDEPENDENT_AMBULATORY_CARE_PROVIDER_SITE_OTHER): Payer: Medicare HMO | Admitting: Family Medicine

## 2017-09-23 VITALS — BP 140/80 | HR 91 | Temp 98.0°F | Ht 62.5 in | Wt 164.4 lb

## 2017-09-23 DIAGNOSIS — Z130 Encounter for screening for diseases of the blood and blood-forming organs and certain disorders involving the immune mechanism: Secondary | ICD-10-CM

## 2017-09-23 DIAGNOSIS — Z13220 Encounter for screening for lipoid disorders: Secondary | ICD-10-CM

## 2017-09-23 DIAGNOSIS — Z1211 Encounter for screening for malignant neoplasm of colon: Secondary | ICD-10-CM

## 2017-09-23 DIAGNOSIS — Z13820 Encounter for screening for osteoporosis: Secondary | ICD-10-CM

## 2017-09-23 DIAGNOSIS — Z0000 Encounter for general adult medical examination without abnormal findings: Secondary | ICD-10-CM

## 2017-09-23 DIAGNOSIS — Z1212 Encounter for screening for malignant neoplasm of rectum: Secondary | ICD-10-CM

## 2017-09-23 DIAGNOSIS — E663 Overweight: Secondary | ICD-10-CM

## 2017-09-23 LAB — COMPREHENSIVE METABOLIC PANEL
ALBUMIN: 4.1 g/dL (ref 3.5–5.2)
ALT: 25 U/L (ref 0–35)
AST: 20 U/L (ref 0–37)
Alkaline Phosphatase: 90 U/L (ref 39–117)
BUN: 14 mg/dL (ref 6–23)
CHLORIDE: 104 meq/L (ref 96–112)
CO2: 30 meq/L (ref 19–32)
Calcium: 9.4 mg/dL (ref 8.4–10.5)
Creatinine, Ser: 0.86 mg/dL (ref 0.40–1.20)
GFR: 67.51 mL/min (ref >60.00)
Glucose, Bld: 103 mg/dL — ABNORMAL HIGH (ref 70–99)
POTASSIUM: 4.9 meq/L (ref 3.5–5.1)
SODIUM: 141 meq/L (ref 135–145)
Total Bilirubin: 0.4 mg/dL (ref 0.2–1.2)
Total Protein: 7.2 g/dL (ref 6.0–8.3)

## 2017-09-23 LAB — CBC
HCT: 39.2 % (ref 36.0–46.0)
HEMOGLOBIN: 13.1 g/dL (ref 12.0–15.0)
MCHC: 33.3 g/dL (ref 30.0–36.0)
MCV: 92.5 fl (ref 78.0–100.0)
PLATELETS: 235 10*3/uL (ref 150.0–400.0)
RBC: 4.24 Mil/uL (ref 3.87–5.11)
RDW: 14.4 % (ref 11.5–15.5)
WBC: 7.3 10*3/uL (ref 4.0–10.5)

## 2017-09-23 LAB — HEMOGLOBIN A1C: HEMOGLOBIN A1C: 6 % (ref 4.6–6.5)

## 2017-09-23 LAB — LDL CHOLESTEROL, DIRECT: LDL DIRECT: 149 mg/dL

## 2017-09-23 NOTE — Assessment & Plan Note (Signed)
Physical exam completed.  Chest wall exam completed given history of breast cancer.  No masses palpated.  Scars appear to be well-healed.  She reports stable unchanged areas of soft tissue near her inferior sternum.  No concerning findings noted.  Colon cancer screening with Cologuard given inconsistent prior colon cancer screening.  Encouraged diet and exercise.  Lab work as outlined below.

## 2017-09-23 NOTE — Patient Instructions (Signed)
Nice to see you. Please continue to stay active and monitor your diet. We will call you with your lab results. We will get Cologuard completed for you. If you notice any changes to your chest wall please be evaluated immediately.

## 2017-09-23 NOTE — Progress Notes (Signed)
Tommi Rumps, MD Phone: [phone]  Kristin Valdez is a 82 y.o. female who presents today for cpe.  Exercises by walking 2 to 3 days a week.  She also volunteers which is quite active. Diet is healthy with lots of vegetables, fruits, and lean meats.  Occasional soda. Tetanus vaccination up-to-date. She had a hysterectomy due to heavy bleeding.  No abnormal pap smears per her report. She has undergone bilateral mastectomies and had an exam by general surgery in December 2018. She has not had a colonoscopy previously and has not had consistent colon cancer screening. DEXA scan up-to-date. She smoked 1 pack/day for about 15 years and quit 40 years ago. 1 glass of wine daily.  No illicit drug use. She sees a Pharmacist, community as needed as she has dentures.  She sees an ophthalmologist frequently.  She is going to have surgery on her right eyelid this week.  Active Ambulatory Problems    Diagnosis Date Noted  . Primary cancer of upper outer quadrant of right female breast ([place]) 05/06/2016  . Osteopenia 05/15/2016  . Routine general medical examination at a health care facility 09/21/2016  . Adenocarcinoma of breast ([place]) 11/08/2016  . Personal history of malignant neoplasm of breast 11/14/2016  . Pain of right thumb 11/29/2016   Resolved Ambulatory Problems    Diagnosis Date Noted  . Vitamin D deficiency 05/15/2016   Past Medical History:  Diagnosis Date  . Breast cancer ([place]) 2014-2015  . Chickenpox     Family History  Problem Relation Age of Onset  . Heart disease Mother   . Prostate cancer Father   . Stroke Brother   . Healthy Daughter     Social History   Socioeconomic History  . Marital status: Widowed    Spouse name: Not on file  . Number of children: Not on file  . Years of education: Not on file  . Highest education level: Not on file  Occupational History  . Not on file  Social Needs  . Financial resource strain: Not hard at all  . Food insecurity:    Worry:  Never true    Inability: Never true  . Transportation needs:    Medical: No    Non-medical: No  Tobacco Use  . Smoking status: Former Smoker    Last attempt to quit: 01/09/1971    Years since quitting: 46.7  . Smokeless tobacco: Never Used  Substance and Sexual Activity  . Alcohol use: Yes    Comment: Occasional  . Drug use: No  . Sexual activity: Never  Lifestyle  . Physical activity:    Days per week: Not on file    Minutes per session: Not on file  . Stress: Not on file  Relationships  . Social connections:    Talks on phone: Not on file    Gets together: Not on file    Attends religious service: Not on file    Active member of club or organization: Not on file    Attends meetings of clubs or organizations: Not on file    Relationship status: Not on file  . Intimate partner violence:    Fear of current or ex partner: Not on file    Emotionally abused: Not on file    Physically abused: Not on file    Forced sexual activity: Not on file  Other Topics Concern  . Not on file  Social History Narrative   Works 2-3 days a week at [employer].  ROS  General:  Negative for nexplained weight loss, fever Skin: Negative for new or changing mole, sore that won't heal HEENT: Negative for trouble hearing, trouble seeing, ringing in ears, mouth sores, hoarseness, change in voice, dysphagia. CV:  Negative for chest pain, dyspnea, edema, palpitations Resp: Negative for cough, dyspnea, hemoptysis GI: Negative for nausea, vomiting, diarrhea, constipation, abdominal pain, melena, hematochezia. GU: Negative for dysuria, incontinence, urinary hesitance, hematuria, vaginal or penile discharge, polyuria, sexual difficulty, lumps in testicle or breasts MSK: Negative for muscle cramps or aches, joint pain or swelling Neuro: Negative for headaches, weakness, numbness, dizziness, passing out/fainting Psych: Negative for depression, anxiety, memory problems  Objective  Physical  Exam Vitals:   09/23/17 1023  BP: 140/80  Pulse: 91  Temp: 98 F (36.7 C)  SpO2: 98%    BP Readings from Last 3 Encounters:  09/23/17 140/80  06/24/17 110/70  05/23/17 (!) 167/102   Wt Readings from Last 3 Encounters:  09/23/17 164 lb 6.4 oz (74.6 kg)  06/24/17 162 lb 1.9 oz (73.5 kg)  05/23/17 163 lb (73.9 kg)    Physical Exam  Constitutional: No distress.  HENT:  Head: Normocephalic and atraumatic.  Mouth/Throat: Oropharynx is clear and moist.  Eyes: Pupils are equal, round, and reactive to light. Conjunctivae are normal.  Neck: Neck supple.  Cardiovascular: Normal rate, regular rhythm and normal heart sounds.  Pulmonary/Chest: Effort normal and breath sounds normal.  Abdominal: Soft. Bowel sounds are normal. She exhibits no distension. There is no tenderness.  Genitourinary:  Genitourinary Comments: Bilateral chest walls with evidence of mastectomies, no breast masses palpated, scars appear to be well-healed with no nodules, she reports there are 2 areas of soft tissue just beside her sternum bilaterally at the lower portion of the sternum that have been present since her prior surgeries and have been unchanged, these are soft with no palpable masses  Musculoskeletal: She exhibits no edema.  Lymphadenopathy:    She has no cervical adenopathy.  Neurological: She is alert.  Skin: Skin is warm and dry. She is not diaphoretic.  Psychiatric: She has a normal mood and affect.     Assessment/Plan:   Routine general medical examination at a health care facility Physical exam completed.  Chest wall exam completed given history of breast cancer.  No masses palpated.  Scars appear to be well-healed.  She reports stable unchanged areas of soft tissue near her inferior sternum.  No concerning findings noted.  Colon cancer screening with Cologuard given inconsistent prior colon cancer screening.  Encouraged diet and exercise.  Lab work as outlined below.   Orders Placed This  Encounter  Procedures  . HgB A1c  . Comp Met (CMET)  . Direct LDL  . CBC  . Cologuard    No orders of the defined types were placed in this encounter.    Tommi Rumps, MD [place]

## 2017-09-25 DIAGNOSIS — H02032 Senile entropion of right lower eyelid: Secondary | ICD-10-CM | POA: Diagnosis not present

## 2017-09-25 DIAGNOSIS — H11231 Symblepharon, right eye: Secondary | ICD-10-CM | POA: Diagnosis not present

## 2017-09-25 DIAGNOSIS — H16141 Punctate keratitis, right eye: Secondary | ICD-10-CM | POA: Diagnosis not present

## 2017-09-25 DIAGNOSIS — H11241 Scarring of conjunctiva, right eye: Secondary | ICD-10-CM | POA: Diagnosis not present

## 2017-09-25 DIAGNOSIS — H02012 Cicatricial entropion of right lower eyelid: Secondary | ICD-10-CM | POA: Diagnosis not present

## 2017-09-25 DIAGNOSIS — H02052 Trichiasis without entropian right lower eyelid: Secondary | ICD-10-CM | POA: Diagnosis not present

## 2017-09-25 DIAGNOSIS — H02532 Eyelid retraction right lower eyelid: Secondary | ICD-10-CM | POA: Diagnosis not present

## 2017-10-14 DIAGNOSIS — Z09 Encounter for follow-up examination after completed treatment for conditions other than malignant neoplasm: Secondary | ICD-10-CM | POA: Diagnosis not present

## 2017-10-14 DIAGNOSIS — L905 Scar conditions and fibrosis of skin: Secondary | ICD-10-CM | POA: Diagnosis not present

## 2017-10-14 DIAGNOSIS — H02052 Trichiasis without entropian right lower eyelid: Secondary | ICD-10-CM | POA: Diagnosis not present

## 2017-10-23 DIAGNOSIS — Z1211 Encounter for screening for malignant neoplasm of colon: Secondary | ICD-10-CM | POA: Diagnosis not present

## 2017-10-23 DIAGNOSIS — Z1212 Encounter for screening for malignant neoplasm of rectum: Secondary | ICD-10-CM | POA: Diagnosis not present

## 2017-10-24 LAB — COLOGUARD: COLOGUARD: POSITIVE

## 2017-11-01 IMAGING — US US BREAST*R* LIMITED INC AXILLA
1 series · 13 of 17 positions shown · non-contrast
Comparison: Previous exam(s).

CLINICAL DATA: 72-year-old female with history of left breast
cancer status post mastectomy presenting for evaluation of a
palpable right breast mass at the 10 o'clock position according to
her physician.

EXAM:
2D DIGITAL DIAGNOSTIC UNILATERAL RIGHT MAMMOGRAM WITH CAD AND
ADJUNCT TOMO
RIGHT BREAST ULTRASOUND

[Series 1: us breast*right* limited inc axilla · 0.05mm/px · 13 of 17 slices shown]
[im 1/17]
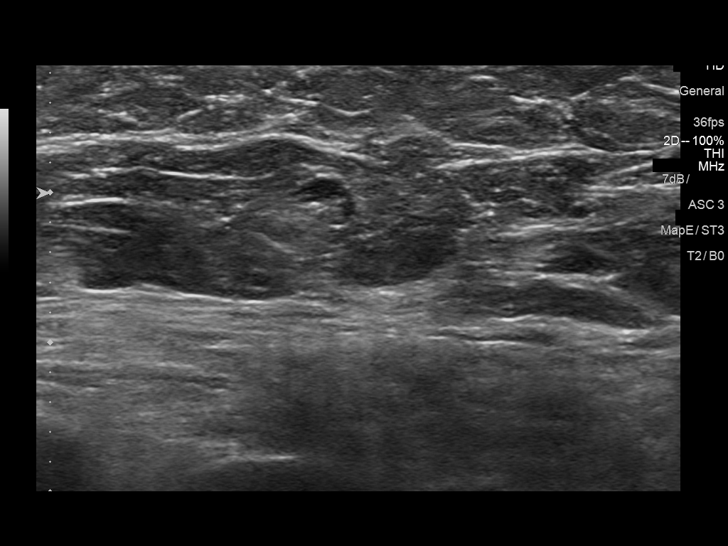
[im 2/17]
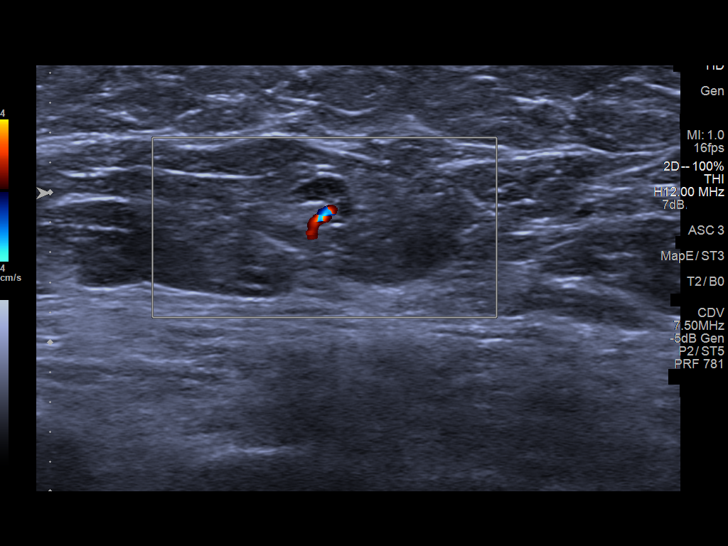
[im 4/17]
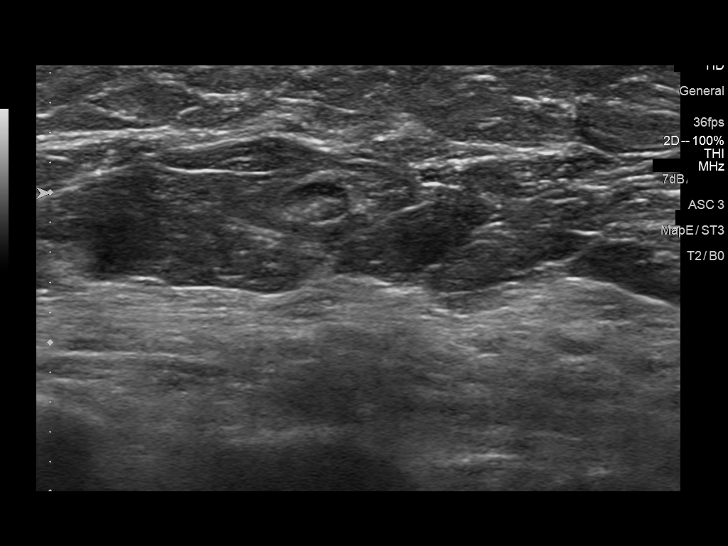
[im 5/17]
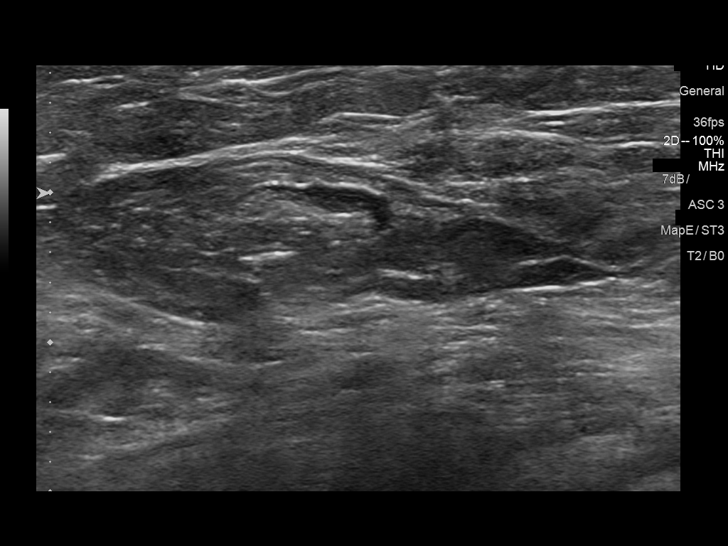
[im 6/17]
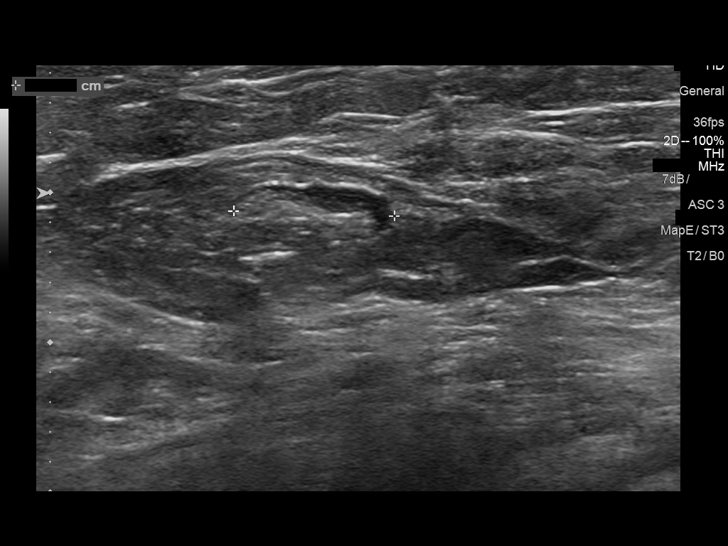
[im 8/17]
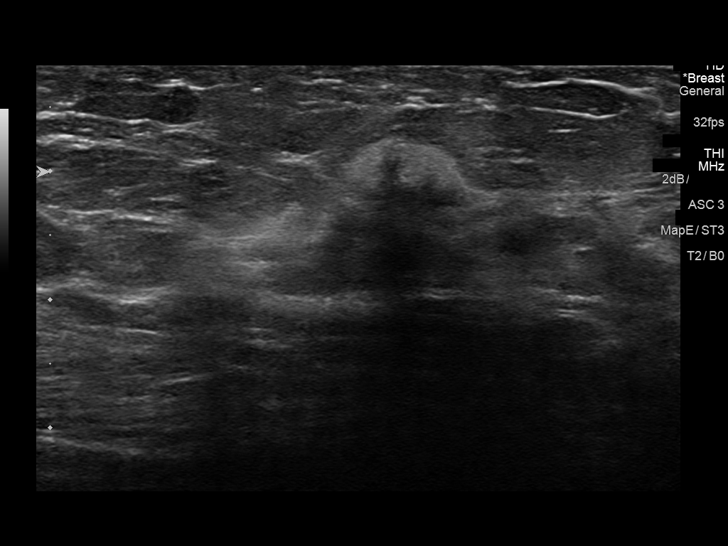
[im 9/17]
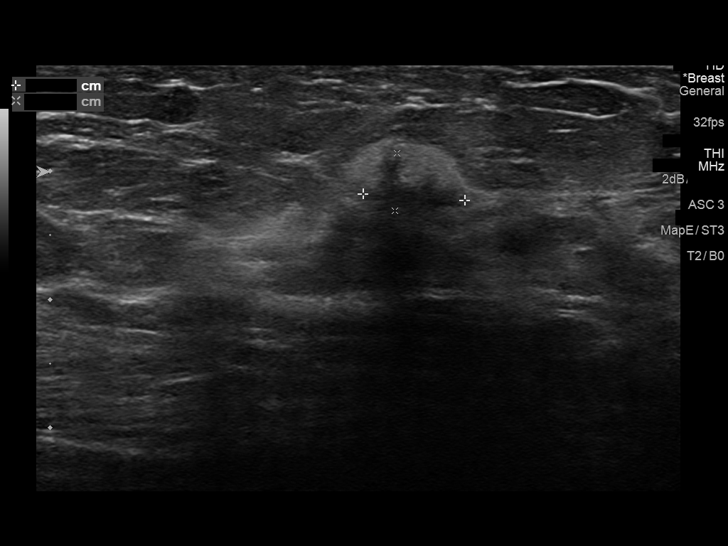
[im 10/17]
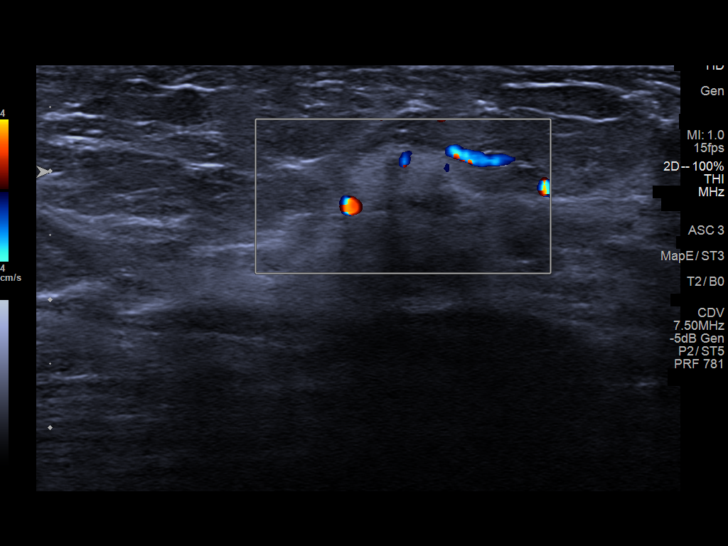
[im 12/17]
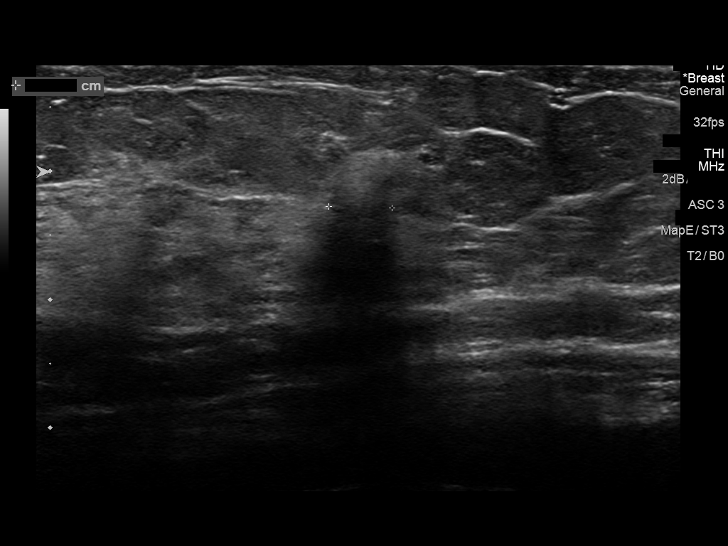
[im 13/17]
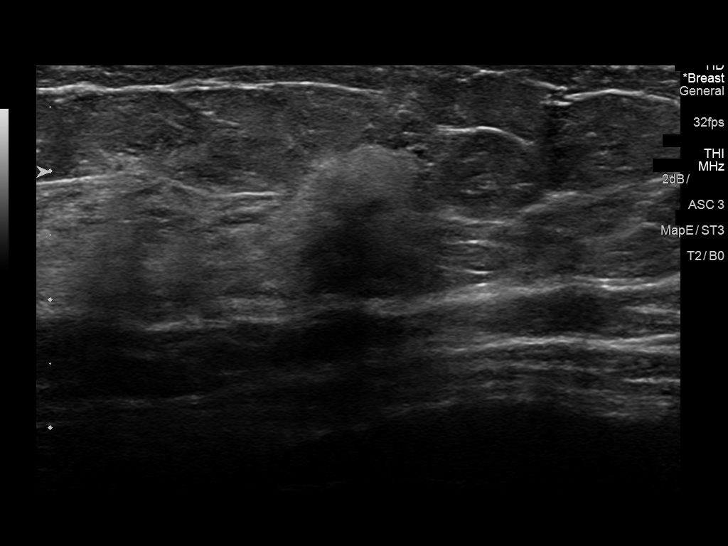
[im 14/17]
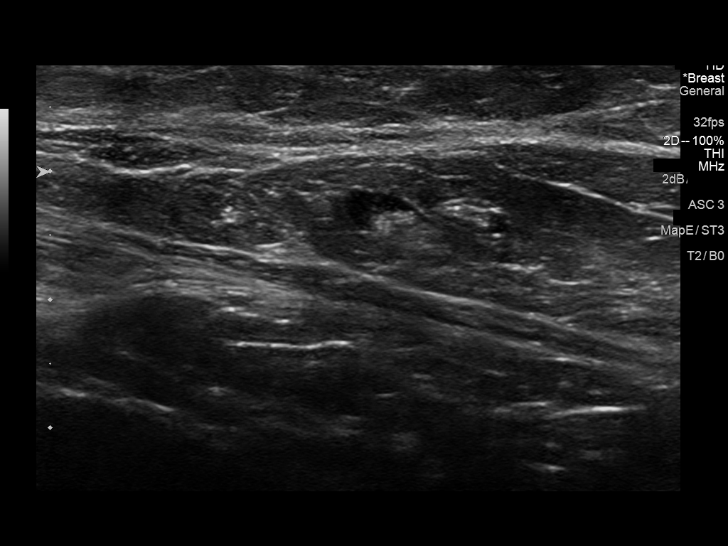
[im 16/17]
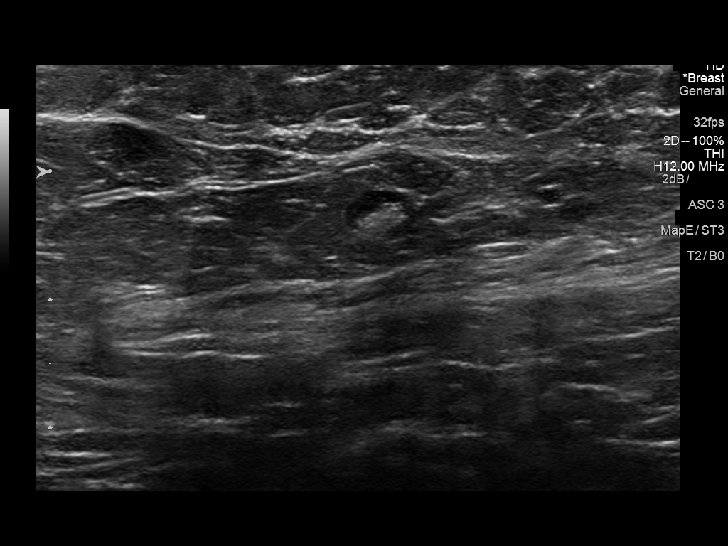
[im 17/17]
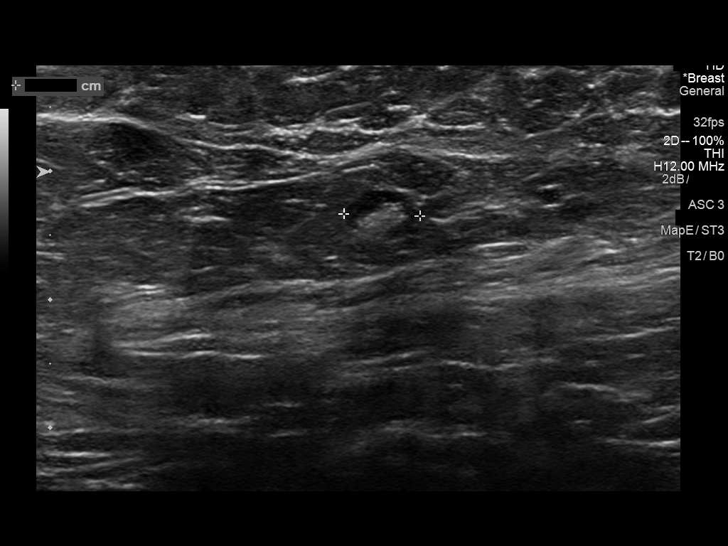

[13 of 17 positions shown; findings below may reference images not displayed]

ACR Breast Density Category c: The breast tissue is heterogeneously
dense, which may obscure small masses.
FINDINGS: A biopsy marking clip is identified in the upper-outer quadrant of
the right breast. There are no suspicious mammographic findings in
the vicinity of the marker. In the upper inner quadrant of the right
breast, middle depth there is a possible obscured irregular mass,
for which ultrasound will be performed for further evaluation.

Mammographic images were processed with CAD.

Physical exam of the upper-outer quadrant of the right breast in the
approximate 10 o'clock position at the site of concern identified by
the patient's physician, no definite palpable masses are identified.
No palpable masses are identified in the upper inner quadrant of the
right breast.

Ultrasound of the upper-outer quadrant of the right breast
demonstrates a small normal-appearing 5 mm lymph node at 9 o'clock,
7 cm from the nipple. In the right breast at 1 o'clock, 2 cm from
the nipple there is a subtle irregular hypoechoic shadowing mass
measuring approximately 8 x 5 x 5 mm. Ultrasound of the right axilla
demonstrates multiple normal-appearing lymph nodes.
IMPRESSION: 1. There is no mammographic or targeted sonographic abnormalities in
the upper-outer quadrant of the right breast at the site of concern
identified by the patient's physician. A normal lymph node is
identified at the 9 o'clock location.

2.  There is an indeterminate right breast mass at 1 o'clock.

3.  No evidence of right axillary lymphadenopathy.

RECOMMENDATION:
1. Ultrasound-guided biopsy is recommended for the right breast mass
at 1 o'clock.

2. Clinical follow-up recommended for the palpable area of concern
in the upper-outer right breast. Any further workup should be based
on clinical grounds.

I have discussed the findings and recommendations with the patient.
Results were also provided in writing at the conclusion of the
visit. If applicable, a reminder letter will be sent to the patient
regarding the next appointment.

BI-RADS CATEGORY  4: Suspicious.

## 2017-11-05 ENCOUNTER — Telehealth: Payer: Self-pay

## 2017-11-05 ENCOUNTER — Encounter: Payer: Self-pay | Admitting: Family Medicine

## 2017-11-05 ENCOUNTER — Telehealth: Payer: Self-pay | Admitting: Family Medicine

## 2017-11-05 NOTE — Telephone Encounter (Signed)
Received results

## 2017-11-05 NOTE — Telephone Encounter (Signed)
Copied from [place] [id number]. Topic: General - Other >> Nov 05, 2017  2:22 PM Yvette Rack wrote: Reason for CRM: cologuard  [employer] calling to see if had gotten the positive results for the cologuard test

## 2017-11-05 NOTE — Telephone Encounter (Signed)
Please contact the patient and let her know that her Cologuard test returned positive.  This could indicate that she has a colon polyp though potentially could also indicate a possible colon cancer. This needs to be evaluated with a colonoscopy.  I would like to refer her to GI for further evaluation.  If she is willing I can place the referral.  Thanks.

## 2017-11-06 DIAGNOSIS — H02051 Trichiasis without entropian right upper eyelid: Secondary | ICD-10-CM | POA: Diagnosis not present

## 2017-11-06 DIAGNOSIS — H02059 Trichiasis without entropian unspecified eye, unspecified eyelid: Secondary | ICD-10-CM | POA: Diagnosis not present

## 2017-11-06 DIAGNOSIS — H02052 Trichiasis without entropian right lower eyelid: Secondary | ICD-10-CM | POA: Diagnosis not present

## 2017-11-06 DIAGNOSIS — H40039 Anatomical narrow angle, unspecified eye: Secondary | ICD-10-CM | POA: Diagnosis not present

## 2017-11-06 DIAGNOSIS — H16141 Punctate keratitis, right eye: Secondary | ICD-10-CM | POA: Diagnosis not present

## 2017-11-06 NOTE — Telephone Encounter (Signed)
Pt returned call - please call [phone]

## 2017-11-06 NOTE — Telephone Encounter (Signed)
Left message to return call, ok for pec to speak to patient about message below 

## 2017-11-06 NOTE — Telephone Encounter (Signed)
Patient notified of results, she states she does not want a colonoscopy. Asked patient if she would like a consult with GI, she states she will think about it and let us know

## 2017-11-06 NOTE — Telephone Encounter (Signed)
Please let the patient know that I would encourage her to see GI and to have a colonoscopy as this positive test could indicate that she has a polyp or a colon cancer that could eventually result in death if not treated.  I can place a referral to GI if she is willing.

## 2017-11-07 NOTE — Telephone Encounter (Signed)
Left message to return call, ok for pec to speak to patient about message below 

## 2017-11-07 NOTE — Telephone Encounter (Signed)
Patient was notified that it could possibly be colon cancer, she does not want referral at this time

## 2017-11-07 NOTE — Telephone Encounter (Signed)
Patient called and states that she is returning a missed call. Please see message below  CB # [phone].

## 2017-11-25 DIAGNOSIS — H2511 Age-related nuclear cataract, right eye: Secondary | ICD-10-CM | POA: Diagnosis not present

## 2017-11-25 DIAGNOSIS — H40013 Open angle with borderline findings, low risk, bilateral: Secondary | ICD-10-CM | POA: Diagnosis not present

## 2017-11-25 DIAGNOSIS — H25013 Cortical age-related cataract, bilateral: Secondary | ICD-10-CM | POA: Diagnosis not present

## 2017-11-25 DIAGNOSIS — H2513 Age-related nuclear cataract, bilateral: Secondary | ICD-10-CM | POA: Diagnosis not present

## 2017-11-25 DIAGNOSIS — H40033 Anatomical narrow angle, bilateral: Secondary | ICD-10-CM | POA: Diagnosis not present

## 2017-11-27 DIAGNOSIS — H16101 Unspecified superficial keratitis, right eye: Secondary | ICD-10-CM | POA: Diagnosis not present

## 2017-12-03 DIAGNOSIS — H2511 Age-related nuclear cataract, right eye: Secondary | ICD-10-CM | POA: Diagnosis not present

## 2017-12-03 DIAGNOSIS — H25811 Combined forms of age-related cataract, right eye: Secondary | ICD-10-CM | POA: Diagnosis not present

## 2017-12-11 DIAGNOSIS — H2511 Age-related nuclear cataract, right eye: Secondary | ICD-10-CM | POA: Diagnosis not present

## 2017-12-18 DIAGNOSIS — H2512 Age-related nuclear cataract, left eye: Secondary | ICD-10-CM | POA: Diagnosis not present

## 2017-12-18 DIAGNOSIS — H25012 Cortical age-related cataract, left eye: Secondary | ICD-10-CM | POA: Diagnosis not present

## 2018-01-03 DIAGNOSIS — H25812 Combined forms of age-related cataract, left eye: Secondary | ICD-10-CM | POA: Diagnosis not present

## 2018-01-03 DIAGNOSIS — H2512 Age-related nuclear cataract, left eye: Secondary | ICD-10-CM | POA: Diagnosis not present

## 2018-01-03 DIAGNOSIS — H52222 Regular astigmatism, left eye: Secondary | ICD-10-CM | POA: Diagnosis not present

## 2018-01-09 DIAGNOSIS — H2512 Age-related nuclear cataract, left eye: Secondary | ICD-10-CM | POA: Diagnosis not present

## 2018-03-23 IMAGING — MG MM BREAST LOCALIZATION CLIP
2 series · 2 of 2 positions shown · non-contrast
Comparison: Previous exam(s).

CLINICAL DATA: Status post ultrasound-guided right breast biopsy

EXAM:
DIAGNOSTIC RIGHT MAMMOGRAM POST ULTRASOUND BIOPSY

[R ML]
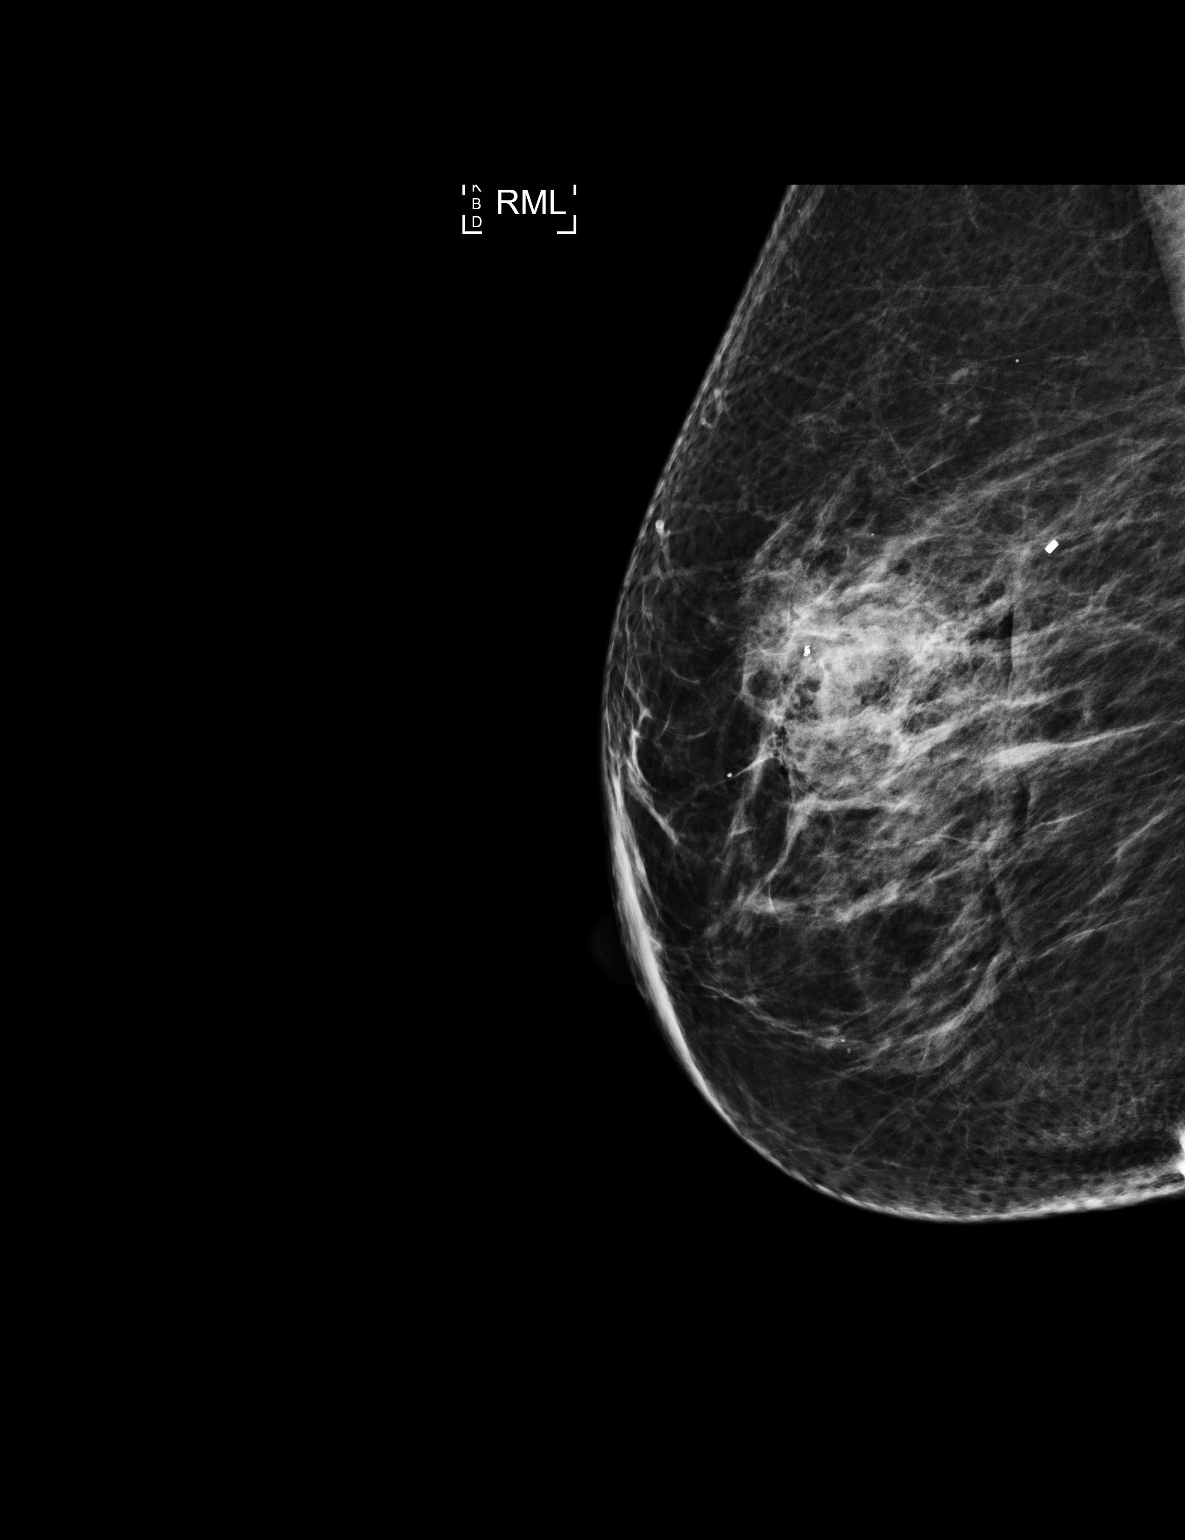

[R CC]
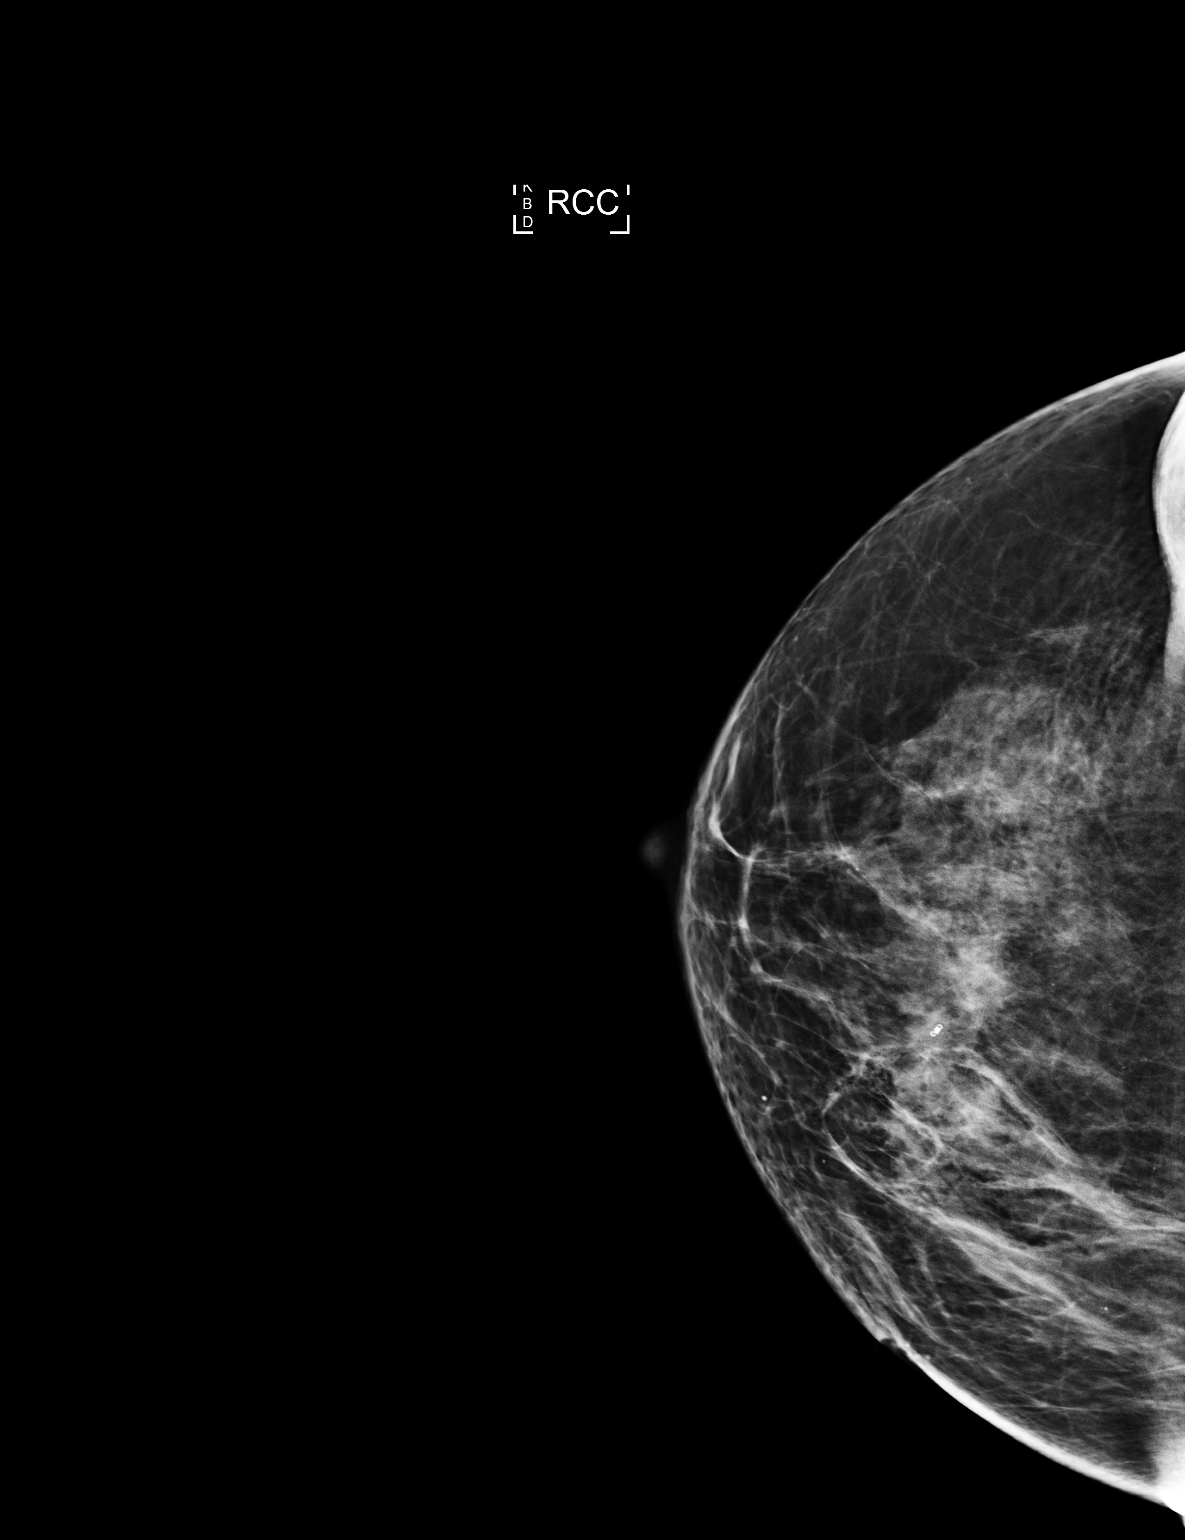

[2 of 2 positions shown; findings below may reference images not displayed]

FINDINGS: Mammographic images were obtained following ultrasound guided biopsy
of a right breast mass at 1 o'clock, 2 cm from the nipple. Post
biopsy mammogram demonstrates the HydroMARK shape 4 butterfly marker
to be in the expected location within the upper, inner right breast.
The mass seen within the upper, inner right breast on the patient's
diagnostic mammogram is obscured by post biopsy change.
IMPRESSION: Appropriate marker position as above.

Final Assessment: Post Procedure Mammograms for Marker Placement

## 2018-03-23 IMAGING — MG US  BREAST BX W/ LOC DEV 1ST LESION IMG BX SPEC US GUIDE*R*
1 series · 8 of 8 positions shown · non-contrast
Comparison: Previous exam(s).

ADDENDUM:
Pathology of the right breast biopsy revealed INVASIVE MAMMARY
CARCINOMA WITH FEATURES SUGGESTIVE OF PLEOMORPHIC LOBULAR CARCINOMA.
PRELIMINARY GRADE 2. This was found to be concordant with Dr. Dahlke
impression and notes.

Recommendations:  Surgical and oncology referrals.
At the patient's request, pathology and recommendations were relayed
to the patient by phone by Dr. Mysty on 05/03/16. The patient
stated she has done well following the biopsy. All of her questions
were answered. She was encouraged to call the [REDACTED] with any further questions or
concerns. Referrals were made by Diaboaro Bourgou, RN, nurse navigator
for [HOSPITAL] with Dr. Ramadza, oncologist
on 05/07/16 at [DATE] and Dr. Gurdjan, surgeon on 05/08/16 at [DATE]. The patient has been informed of the appointments.
Results, recommendations and appointment information have been
relayed to Dr. Ahyar nurse.
Addendum by Jon, Patrick on 05/04/16.
CLINICAL DATA: 80-year-old female for ultrasound-guided biopsy of
an indeterminate right breast mass
EXAM:
ULTRASOUND GUIDED RIGHT BREAST CORE NEEDLE BIOPSY

[Series 1: MG view · 0.06mm/px · 8 of 12 slices shown]
[im 1/12]
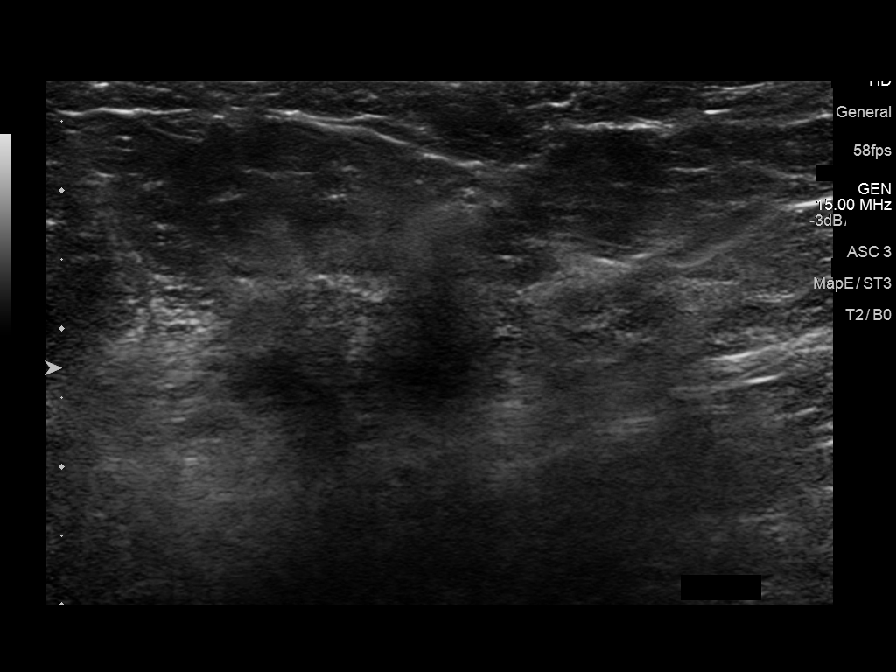
[im 2/12]
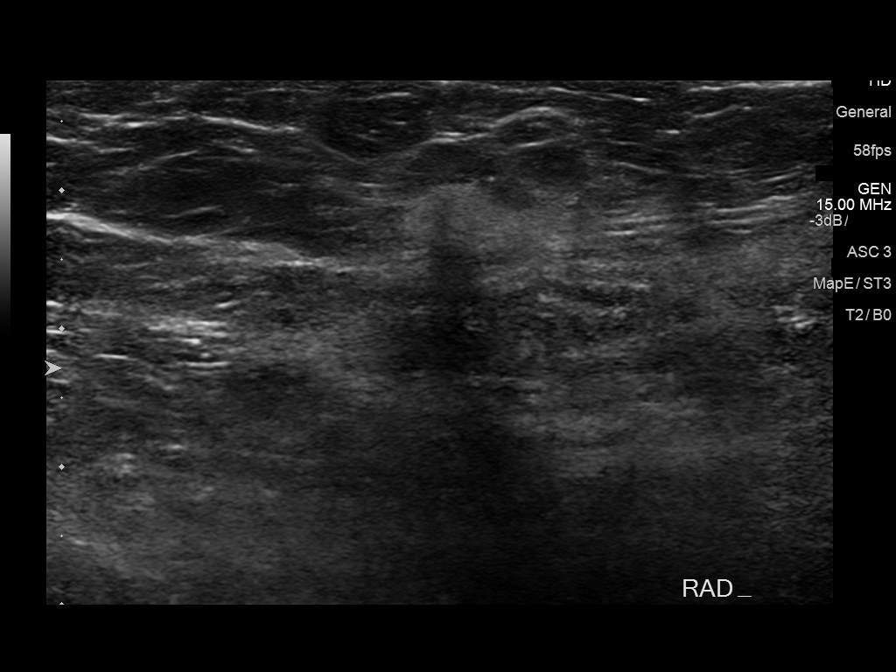
[im 4/12]
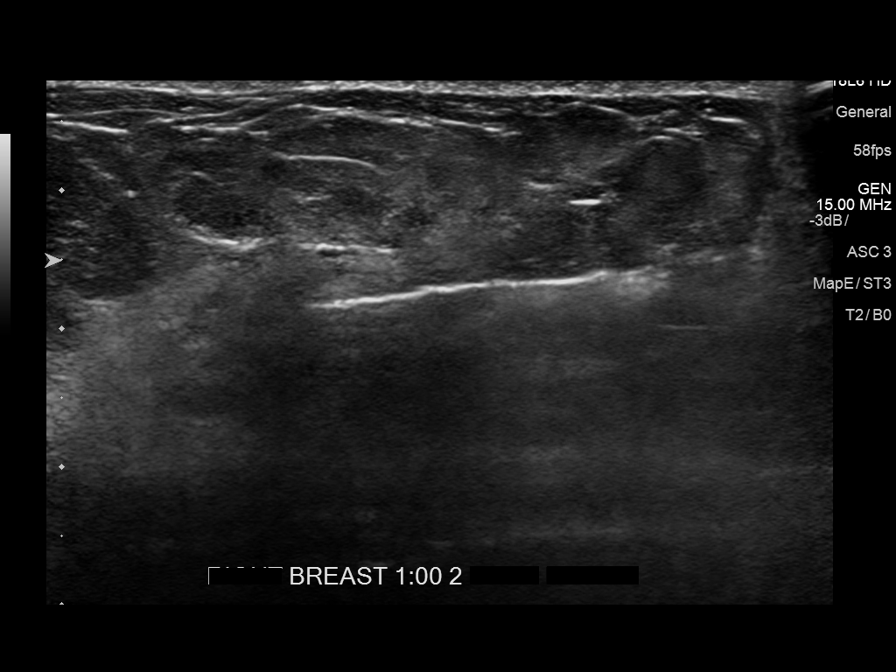
[im 5/12]
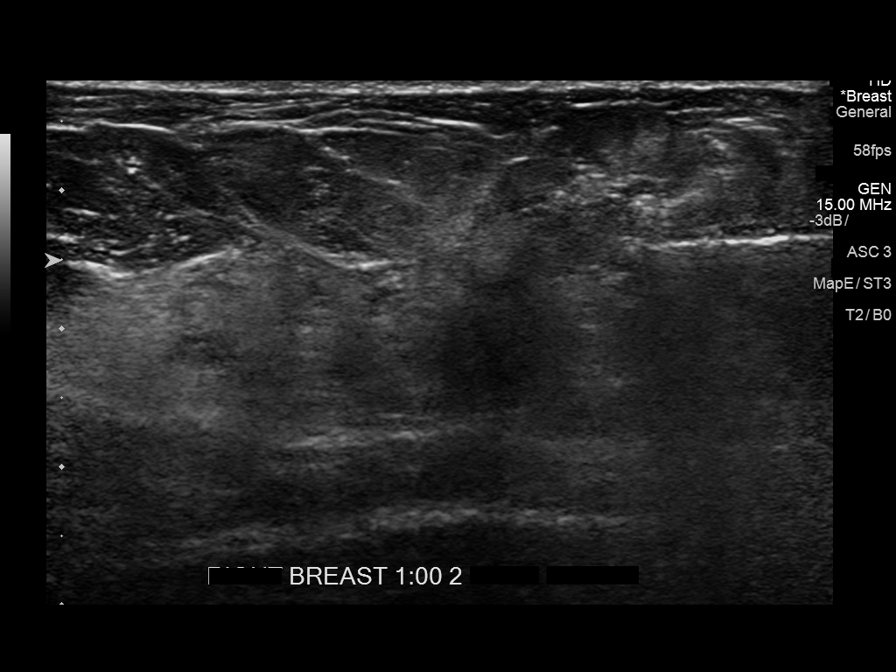
[im 7/12]
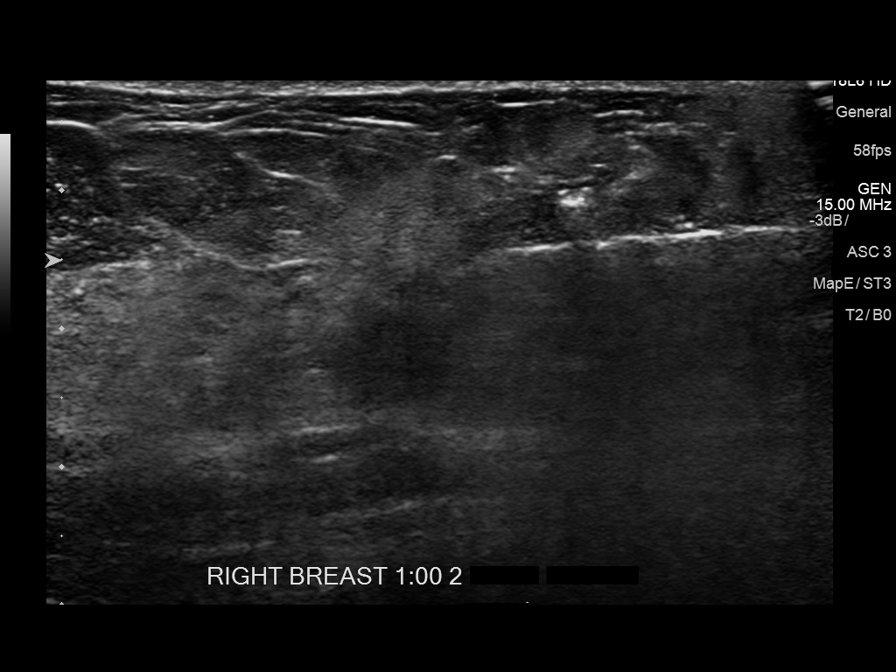
[im 8/12]
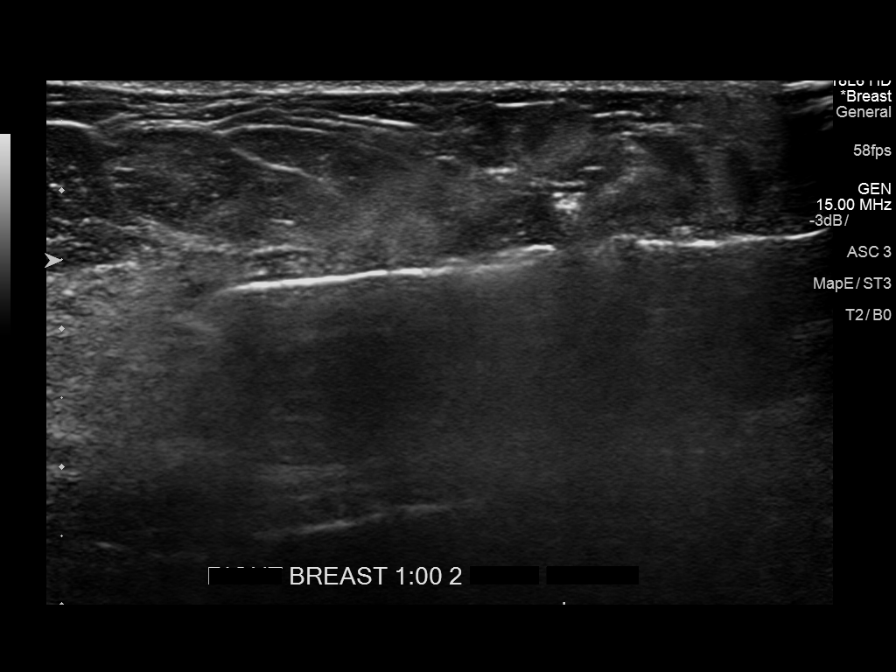
[im 10/12]
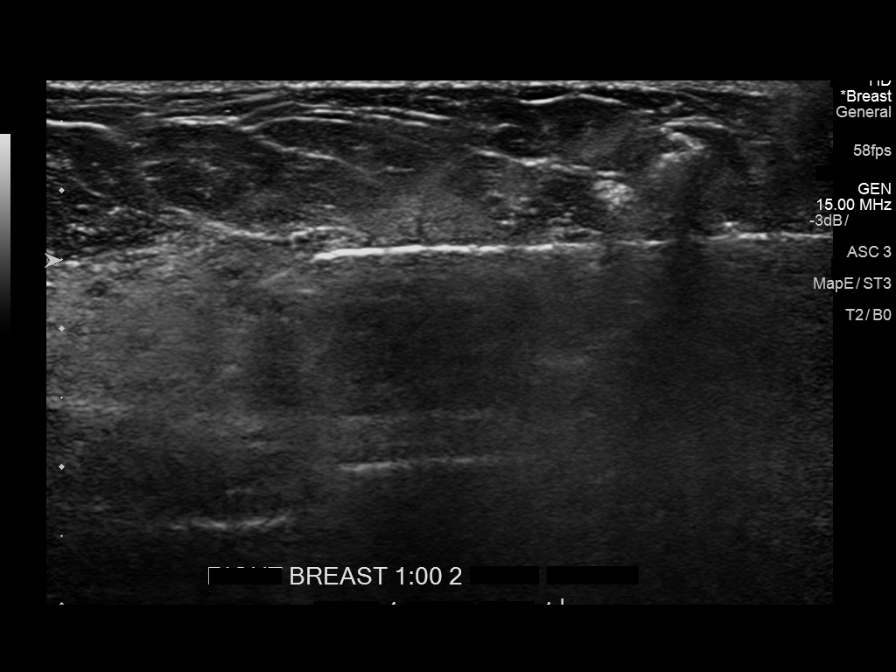
[im 12/12]
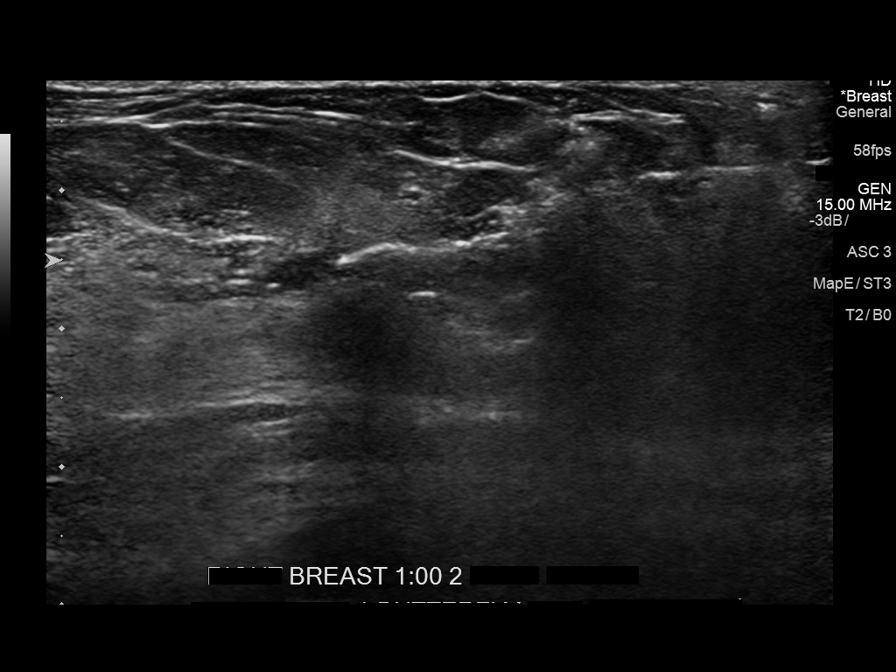

[8 of 8 positions shown; findings below may reference images not displayed]



Using sterile technique and 1% Lidocaine as local anesthetic, under
direct ultrasound visualization, a 12 gauge Morin device was
used to perform biopsy of an indeterminate right breast mass at 1
o'clock, 2 cm from the nipple using a medial to lateral approach. At
the conclusion of the procedure a HydroMARK shape 4 butterfly tissue
marker clip was deployed into the biopsy cavity. Follow up 2 view
mammogram was performed and dictated separately.
IMPRESSION: Ultrasound guided biopsy of an indeterminate right breast mass at 1
o'clock, 2 cm from the nipple. No apparent complications.

## 2018-05-01 DIAGNOSIS — H02883 Meibomian gland dysfunction of right eye, unspecified eyelid: Secondary | ICD-10-CM | POA: Diagnosis not present

## 2018-05-01 DIAGNOSIS — H02885 Meibomian gland dysfunction left lower eyelid: Secondary | ICD-10-CM | POA: Diagnosis not present

## 2018-05-01 DIAGNOSIS — H02059 Trichiasis without entropian unspecified eye, unspecified eyelid: Secondary | ICD-10-CM | POA: Diagnosis not present

## 2018-05-01 DIAGNOSIS — H02882 Meibomian gland dysfunction right lower eyelid: Secondary | ICD-10-CM | POA: Diagnosis not present

## 2018-05-07 DIAGNOSIS — H02883 Meibomian gland dysfunction of right eye, unspecified eyelid: Secondary | ICD-10-CM | POA: Diagnosis not present

## 2018-05-07 DIAGNOSIS — H02885 Meibomian gland dysfunction left lower eyelid: Secondary | ICD-10-CM | POA: Diagnosis not present

## 2018-05-07 DIAGNOSIS — H02882 Meibomian gland dysfunction right lower eyelid: Secondary | ICD-10-CM | POA: Diagnosis not present

## 2018-05-07 DIAGNOSIS — H02059 Trichiasis without entropian unspecified eye, unspecified eyelid: Secondary | ICD-10-CM | POA: Diagnosis not present

## 2018-05-19 DIAGNOSIS — H40023 Open angle with borderline findings, high risk, bilateral: Secondary | ICD-10-CM | POA: Diagnosis not present

## 2018-05-19 DIAGNOSIS — Z961 Presence of intraocular lens: Secondary | ICD-10-CM | POA: Diagnosis not present

## 2018-05-19 DIAGNOSIS — H16143 Punctate keratitis, bilateral: Secondary | ICD-10-CM | POA: Diagnosis not present

## 2018-05-19 DIAGNOSIS — H35351 Cystoid macular degeneration, right eye: Secondary | ICD-10-CM | POA: Diagnosis not present

## 2018-06-25 ENCOUNTER — Ambulatory Visit (INDEPENDENT_AMBULATORY_CARE_PROVIDER_SITE_OTHER): Payer: Medicare HMO

## 2018-06-25 ENCOUNTER — Other Ambulatory Visit: Payer: Self-pay

## 2018-06-25 ENCOUNTER — Ambulatory Visit (INDEPENDENT_AMBULATORY_CARE_PROVIDER_SITE_OTHER): Payer: Medicare HMO | Admitting: Family Medicine

## 2018-06-25 ENCOUNTER — Encounter: Payer: Self-pay | Admitting: Family Medicine

## 2018-06-25 VITALS — BP 122/68 | HR 85 | Temp 98.1°F | Resp 14 | Ht 64.0 in | Wt 172.2 lb

## 2018-06-25 DIAGNOSIS — Z0000 Encounter for general adult medical examination without abnormal findings: Secondary | ICD-10-CM | POA: Diagnosis not present

## 2018-06-25 DIAGNOSIS — H6982 Other specified disorders of Eustachian tube, left ear: Secondary | ICD-10-CM | POA: Diagnosis not present

## 2018-06-25 DIAGNOSIS — H6991 Unspecified Eustachian tube disorder, right ear: Secondary | ICD-10-CM | POA: Insufficient documentation

## 2018-06-25 DIAGNOSIS — H6981 Other specified disorders of Eustachian tube, right ear: Secondary | ICD-10-CM | POA: Insufficient documentation

## 2018-06-25 DIAGNOSIS — H6980 Other specified disorders of Eustachian tube, unspecified ear: Secondary | ICD-10-CM | POA: Insufficient documentation

## 2018-06-25 DIAGNOSIS — L821 Other seborrheic keratosis: Secondary | ICD-10-CM | POA: Insufficient documentation

## 2018-06-25 NOTE — Patient Instructions (Signed)
Nice to see you. You likely have eustachian tube dysfunction.  Please try over-the-counter Flonase and/or Claritin.  If it is not improving with use of these things in the next 2 weeks please contact us for an ENT referral.   Eustachian Tube Dysfunction  Eustachian tube dysfunction refers to a condition in which a blockage develops in the narrow passage that connects the middle ear to the back of the nose (eustachian tube). The eustachian tube regulates air pressure in the middle ear by letting air move between the ear and nose. It also helps to drain fluid from the middle ear space. Eustachian tube dysfunction can affect one or both ears. When the eustachian tube does not function properly, air pressure, fluid, or both can build up in the middle ear. What are the causes? This condition occurs when the eustachian tube becomes blocked or cannot open normally. Common causes of this condition include:  Ear infections.  Colds and other infections that affect the nose, mouth, and throat (upper respiratory tract).  Allergies.  Irritation from cigarette smoke.  Irritation from stomach acid coming up into the esophagus (gastroesophageal reflux). The esophagus is the tube that carries food from the mouth to the stomach.  Sudden changes in air pressure, such as from descending in an airplane or scuba diving.  Abnormal growths in the nose or throat, such as: ? Growths that line the nose (nasal polyps). ? Abnormal growth of cells (tumors). ? Enlarged tissue at the back of the throat (adenoids). What increases the risk? You are more likely to develop this condition if:  You smoke.  You are overweight.  You are a child who has: ? Certain birth defects of the mouth, such as cleft palate. ? Large tonsils or adenoids. What are the signs or symptoms? Common symptoms of this condition include:  A feeling of fullness in the ear.  Ear pain.  Clicking or popping noises in the ear.  Ringing in  the ear.  Hearing loss.  Loss of balance.  Dizziness. Symptoms may get worse when the air pressure around you changes, such as when you travel to an area of high elevation, fly on an airplane, or go scuba diving. How is this diagnosed? This condition may be diagnosed based on:  Your symptoms.  A physical exam of your ears, nose, and throat.  Tests, such as those that measure: ? The movement of your eardrum (tympanogram). ? Your hearing (audiometry). How is this treated? Treatment depends on the cause and severity of your condition.  In mild cases, you may relieve your symptoms by moving air into your ears. This is called "popping the ears."  In more severe cases, or if you have symptoms of fluid in your ears, treatment may include: ? Medicines to relieve congestion (decongestants). ? Medicines that treat allergies (antihistamines). ? Nasal sprays or ear drops that contain medicines that reduce swelling (steroids). ? A procedure to drain the fluid in your eardrum (myringotomy). In this procedure, a small tube is placed in the eardrum to:  Drain the fluid.  Restore the air in the middle ear space. ? A procedure to insert a balloon device through the nose to inflate the opening of the eustachian tube (balloon dilation). Follow these instructions at home: Lifestyle  Do not do any of the following until your health care provider approves: ? Travel to high altitudes. ? Fly in airplanes. ? Work in a Pension scheme manager or room. ? Scuba dive.  Do not use any products  that contain nicotine or tobacco, such as cigarettes and e-cigarettes. If you need help quitting, ask your health care provider.  Keep your ears dry. Wear fitted earplugs during showering and bathing. Dry your ears completely after. General instructions  Take over-the-counter and prescription medicines only as told by your health care provider.  Use techniques to help pop your ears as recommended by your health  care provider. These may include: ? Chewing gum. ? Yawning. ? Frequent, forceful swallowing. ? Closing your mouth, holding your nose closed, and gently blowing as if you are trying to blow air out of your nose.  Keep all follow-up visits as told by your health care provider. This is important. Contact a health care provider if:  Your symptoms do not go away after treatment.  Your symptoms come back after treatment.  You are unable to pop your ears.  You have: ? A fever. ? Pain in your ear. ? Pain in your head or neck. ? Fluid draining from your ear.  Your hearing suddenly changes.  You become very dizzy.  You lose your balance. Summary  Eustachian tube dysfunction refers to a condition in which a blockage develops in the eustachian tube.  It can be caused by ear infections, allergies, inhaled irritants, or abnormal growths in the nose or throat.  Symptoms include ear pain, hearing loss, or ringing in the ears.  Mild cases are treated with maneuvers to unblock the ears, such as yawning or ear popping.  Severe cases are treated with medicines. Surgery may also be done (rare). This information is not intended to replace advice given to you by your health care provider. Make sure you discuss any questions you have with your health care provider. Document Released: 06/10/2015 Document Revised: 09/03/2017 Document Reviewed: 09/03/2017 Elsevier Interactive Patient Education  2019 [employer].

## 2018-06-25 NOTE — Progress Notes (Signed)
Subjective:   JATIA MUSA is a 83 y.o. female who presents for Medicare Annual (Subsequent) preventive examination.  Review of Systems:  No ROS.  Medicare Wellness Visit. Additional risk factors are reflected in the social history. Cardiac Risk Factors include: advanced age (>75men, >47 women)     Objective:     Vitals: BP 122/68 (BP Location: Left Arm, Patient Position: Sitting, Cuff Size: Normal)   Pulse 85   Temp 98.1 F (36.7 C) (Oral)   Resp 14   Ht 5\' 4"  (1.626 m)   Wt 172 lb 3.2 oz (78.1 kg)   SpO2 98%   BMI 29.56 kg/m   Body mass index is 29.56 kg/m.  Advanced Directives 06/25/2018 06/24/2017 11/29/2016 08/13/2016 06/07/2016 05/24/2016 05/24/2016  Does Patient Have a Medical Advance Directive? Yes Yes No Yes Yes No;Yes -  Type of Paramedic of [place];Living will [place];Living will - [place];Living will Living will;Healthcare Power of Attorney Living will Living will  Does patient want to make changes to medical advance directive? No - Patient declined No - Patient declined - - - No - Patient declined -  Copy of [place] in Chart? No - copy requested No - copy requested - No - copy requested - - -    Tobacco Social History   Tobacco Use  Smoking Status Former Smoker  . Last attempt to quit: 01/09/1971  . Years since quitting: 47.4  Smokeless Tobacco Never Used     Counseling given: Not Answered   Clinical Intake:  Pre-visit preparation completed: Yes  Pain : No/denies pain     Diabetes: No  How often do you need to have someone help you when you read instructions, pamphlets, or other written materials from your doctor or pharmacy?: 1 - Never  Interpreter Needed?: No     Past Medical History:  Diagnosis Date  . Breast cancer ([place]) 2014-2015   left breast cancer  . Chickenpox    Past Surgical History:  Procedure Laterality Date  . ABDOMINAL HYSTERECTOMY    .  APPENDECTOMY    . BREAST BIOPSY Left    Stereotactic biopsy  . BREAST BIOPSY Right 05/02/2016   US guided biopsy  . MASTECTOMY Left 2015  . MASTECTOMY W/ SENTINEL NODE BIOPSY Right 05/24/2016   Procedure: MASTECTOMY WITH SENTINEL LYMPH NODE BIOPSY;  Surgeon: Hubbard Robinson, MD;  Location: [facility] ORS;  Service: General;  Laterality: Right;   Family History  Problem Relation Age of Onset  . Heart disease Mother   . Prostate cancer Father   . Stroke Brother   . Healthy Daughter    Social History   Socioeconomic History  . Marital status: Widowed    Spouse name: Not on file  . Number of children: Not on file  . Years of education: Not on file  . Highest education level: Not on file  Occupational History  . Not on file  Social Needs  . Financial resource strain: Not hard at all  . Food insecurity:    Worry: Never true    Inability: Never true  . Transportation needs:    Medical: No    Non-medical: No  Tobacco Use  . Smoking status: Former Smoker    Last attempt to quit: 01/09/1971    Years since quitting: 47.4  . Smokeless tobacco: Never Used  Substance and Sexual Activity  . Alcohol use: Yes    Comment: Occasional  .  Drug use: No  . Sexual activity: Never  Lifestyle  . Physical activity:    Days per week: Not on file    Minutes per session: Not on file  . Stress: Not at all  Relationships  . Social connections:    Talks on phone: Not on file    Gets together: Not on file    Attends religious service: Not on file    Active member of club or organization: Not on file    Attends meetings of clubs or organizations: Not on file    Relationship status: Not on file  Other Topics Concern  . Not on file  Social History Narrative   Works 2-3 days a week at [employer].    Outpatient Encounter Medications as of 06/25/2018  Medication Sig  . acetaminophen (TYLENOL) 325 MG tablet Take 650 mg by mouth every 6 (six) hours as needed for moderate pain.  . clobetasol  cream (TEMOVATE) 1.61 % Apply 1 application topically 2 (two) times daily.  . [DISCONTINUED] brimonidine (ALPHAGAN) 0.2 % ophthalmic solution brimonidine 0.2 % eye drops   No facility-administered encounter medications on file as of 06/25/2018.     Activities of Daily Living In your present state of health, do you have any difficulty performing the following activities: 06/25/2018  Hearing? N  Vision? N  Difficulty concentrating or making decisions? N  Walking or climbing stairs? N  Dressing or bathing? N  Doing errands, shopping? N  Preparing Food and eating ? N  Using the Toilet? N  In the past six months, have you accidently leaked urine? N  Do you have problems with loss of bowel control? N  Managing your Medications? N  Managing your Finances? N  Housekeeping or managing your Housekeeping? N  Some recent data might be hidden    Patient Care Team: Leone Haven, MD as PCP - General (Family Medicine)    Assessment:   This is a routine wellness examination for Keilana.  Notes her L ear feels like she needs to pop it for 1-2 weeks and sometimes hears different sounds.  Difficulty hearing.   Return today for same day appointment with your doctor.   Health Screenings  Mammogram -04/13/16 Mastectomy-05/16/16 Cologuard-10/24/17 Bone Density-09/19/16 Glaucoma- none Hearing -demonstrates normal hearing in conversation Hemoglobin A1C-09/23/17 (6.0) Direct LDL- 09/23/17 (149.0) Dental- UTD  Social  Alcohol intake -yes, occasional Smoking history- former Smokers in home? none Illicit drug use? none Exercise -walking Diet -regular Sexually Active -never  Safety  Patient feels safe at home.  Patient does have smoke detectors at home  Patient does wear sunscreen or protective clothing when in direct sunlight  Patient does wear seat belt when driving or riding with others.   Activities of Daily Living Patient can do their own household chores. Denies needing assistance  with: driving, feeding themselves, getting from bed to chair, getting to the toilet, bathing/showering, dressing, managing money, climbing flight of stairs, or preparing meals.   Depression Screen Patient denies losing interest in daily life, feeling hopeless, or crying easily over simple problems.   Fall Screen Patient denies being afraid of falling or falling in the last year.    Memory Screen Patient denies problems with memory, misplacing items, and is able to balance checkbook/bank accounts.  Patient is alert, normal appearance, oriented to person/place/and time. Correctly identified the president of the [place], recall of 2/3 objects, and performing simple calculations.  Patient displays appropriate judgement and can read correct time from watch  face.   Immunizations The following Immunizations are up to date: Influenza, shingles, pneumonia, and tetanus.   Other Providers Patient Care Team: Leone Haven, MD as PCP - General (Family Medicine)  Exercise Activities and Dietary recommendations Current Exercise Habits: Home exercise routine, Type of exercise: walking, Intensity: Mild  Goals    . Maintain Healthy Lifestyle     Stay active Stay hydrated Exercise        Fall Risk Fall Risk  06/25/2018 06/24/2017 11/29/2016 09/21/2016  Falls in the past year? 0 No Yes No  Number falls in past yr: - - 1 -  Injury with Fall? - - Yes -  Follow up - - Falls evaluation completed -   Depression Screen PHQ 2/9 Scores 06/25/2018 06/24/2017 11/29/2016  PHQ - 2 Score 0 0 0  Exception Documentation - - Patient refusal     Cognitive Function     6CIT Screen 06/25/2018 06/24/2017  What Year? 0 points 0 points  What month? 0 points 0 points  What time? 0 points 0 points  Count back from 20 0 points 0 points  Months in reverse 0 points 0 points  Repeat phrase 0 points 0 points  Total Score 0 0    Immunization History  Administered Date(s) Administered  . Tdap 10/29/2013    Screening Tests Health Maintenance  Topic Date Due  . TETANUS/TDAP  10/30/2023  . DEXA SCAN  Completed  . INFLUENZA VACCINE  Discontinued      Plan:    End of life planning; Advance aging; Advanced directives discussed. Copy of current HCPOA/Living Will requested.    I have personally reviewed and noted the following in the patient's chart:   . Medical and social history . Use of alcohol, tobacco or illicit drugs  . Current medications and supplements . Functional ability and status . Nutritional status . Physical activity . Advanced directives . List of other physicians . Hospitalizations, surgeries, and ER visits in previous 12 months . Vitals . Screenings to include cognitive, depression, and falls . Referrals and appointments  In addition, I have reviewed and discussed with patient certain preventive protocols, quality metrics, and best practice recommendations. A written personalized care plan for preventive services as well as general preventive health recommendations were provided to patient.     Varney Biles, LPN  [id number]

## 2018-06-25 NOTE — Progress Notes (Signed)
  Tommi Rumps, MD Phone: [phone]  Kristin Valdez is a 83 y.o. female who presents today for same-day visit.  CC: Left ear clogged, bumps on face  Left ear clogged: Patient notes recently her left ear has felt clogged up and feels like she wants to pop it.  She notes decreased hearing.  No tinnitus or pulsating sensation though does hear some white noise.  No fevers.  She notes higher pitched voices echo back at her.  She has no drainage from her ear.  No vertigo.  No congestion in her sinuses.  Bumps on face: These have been present for quite some time.  She has been using clobetasol intermittently 1-2 times a week on this.  This has not been beneficial.  Occasionally they itch though otherwise it will bother her.  Social History   Tobacco Use  Smoking Status Former Smoker  . Last attempt to quit: 01/09/1971  . Years since quitting: 47.4  Smokeless Tobacco Never Used     ROS see history of present illness  Objective  Physical Exam Vitals:   06/25/18 1420  BP: 116/78  Pulse: 94  Temp: 98.3 F (36.8 C)  SpO2: 96%    BP Readings from Last 3 Encounters:  06/25/18 116/78  06/25/18 122/68  09/23/17 140/80   Wt Readings from Last 3 Encounters:  06/25/18 173 lb 3.2 oz (78.6 kg)  06/25/18 172 lb 3.2 oz (78.1 kg)  09/23/17 164 lb 6.4 oz (74.6 kg)    Physical Exam Constitutional:      General: She is not in acute distress.    Appearance: She is not diaphoretic.  HENT:     Head:     Comments: No sinus tenderness to percussion, scattered seborrheic keratoses around her nose and between her eyebrows    Right Ear: Tympanic membrane normal.     Left Ear: Tympanic membrane normal.     Ears:     Comments: Diminished hearing to finger rub on the left, finger rub hearing intact on the right    Mouth/Throat:     Mouth: Mucous membranes are moist.     Pharynx: Oropharynx is clear.  Cardiovascular:     Rate and Rhythm: Normal rate and regular rhythm.     Heart sounds:  Normal heart sounds.  Pulmonary:     Effort: Pulmonary effort is normal.     Breath sounds: Normal breath sounds.  Lymphadenopathy:     Head:     Right side of head: No posterior auricular adenopathy.     Left side of head: No posterior auricular adenopathy.     Cervical: No cervical adenopathy.  Skin:    General: Skin is warm and dry.  Neurological:     Mental Status: She is alert.      Assessment/Plan: Please see individual problem list.  Eustachian tube dysfunction I suspect eustachian tube dysfunction.  She will trial Claritin and Flonase.  If not improving over the next 2 weeks she will contact us and we will refer to ENT.  Seborrheic keratoses Scattered on her face around her nose and between her eyebrows.  These lesions appear benign.  Discussed referral to dermatology for cryotherapy or removal though she declined.  She will monitor.   No orders of the defined types were placed in this encounter.   No orders of the defined types were placed in this encounter.    Tommi Rumps, MD [place]

## 2018-06-25 NOTE — Assessment & Plan Note (Signed)
I suspect eustachian tube dysfunction.  She will trial Claritin and Flonase.  If not improving over the next 2 weeks she will contact us and we will refer to ENT.

## 2018-06-25 NOTE — Assessment & Plan Note (Signed)
Scattered on her face around her nose and between her eyebrows.  These lesions appear benign.  Discussed referral to dermatology for cryotherapy or removal though she declined.  She will monitor.

## 2018-06-25 NOTE — Patient Instructions (Addendum)
  Kristin Valdez , Thank you for taking time to come for your Medicare Wellness Visit. I appreciate your ongoing commitment to your health goals. Please review the following plan we discussed and let me know if I can assist you in the future.   Bring a copy of your [place] and/or Living Will to be scanned into chart.  Have a great day and stay warm!  These are the goals we discussed: Goals    . Maintain Healthy Lifestyle     Stay active Stay hydrated Exercise        This is a list of the screening recommended for you and due dates:  Health Maintenance  Topic Date Due  . Tetanus Vaccine  10/30/2023  . DEXA scan (bone density measurement)  Completed  . Flu Shot  Discontinued

## 2018-06-26 NOTE — Progress Notes (Signed)
I have reviewed the above note and agree.  Eric Sonnenberg, M.D.  

## 2018-06-30 ENCOUNTER — Ambulatory Visit: Payer: Medicare HMO | Admitting: Family Medicine

## 2018-07-09 ENCOUNTER — Encounter: Payer: Self-pay | Admitting: Family Medicine

## 2018-07-09 ENCOUNTER — Ambulatory Visit (INDEPENDENT_AMBULATORY_CARE_PROVIDER_SITE_OTHER): Payer: Medicare HMO | Admitting: Family Medicine

## 2018-07-09 VITALS — BP 126/62 | HR 85 | Temp 98.2°F | Resp 16 | Ht 64.0 in | Wt 170.0 lb

## 2018-07-09 DIAGNOSIS — E559 Vitamin D deficiency, unspecified: Secondary | ICD-10-CM

## 2018-07-09 DIAGNOSIS — H9192 Unspecified hearing loss, left ear: Secondary | ICD-10-CM

## 2018-07-09 DIAGNOSIS — H9313 Tinnitus, bilateral: Secondary | ICD-10-CM | POA: Diagnosis not present

## 2018-07-09 DIAGNOSIS — E538 Deficiency of other specified B group vitamins: Secondary | ICD-10-CM | POA: Diagnosis not present

## 2018-07-09 LAB — COMPREHENSIVE METABOLIC PANEL
ALBUMIN: 4.3 g/dL (ref 3.5–5.2)
ALK PHOS: 83 U/L (ref 39–117)
ALT: 18 U/L (ref 0–35)
AST: 17 U/L (ref 0–37)
BILIRUBIN TOTAL: 0.3 mg/dL (ref 0.2–1.2)
BUN: 18 mg/dL (ref 6–23)
CO2: 27 mEq/L (ref 19–32)
CREATININE: 0.85 mg/dL (ref 0.40–1.20)
Calcium: 9.6 mg/dL (ref 8.4–10.5)
Chloride: 103 mEq/L (ref 96–112)
GFR: 64.25 mL/min (ref >60.00)
Glucose, Bld: 93 mg/dL (ref 70–99)
POTASSIUM: 4.3 meq/L (ref 3.5–5.1)
SODIUM: 139 meq/L (ref 135–145)
TOTAL PROTEIN: 7.3 g/dL (ref 6.0–8.3)

## 2018-07-09 LAB — VITAMIN B12: VITAMIN B 12: 208 pg/mL — AB (ref 211–911)

## 2018-07-09 LAB — CBC
HEMATOCRIT: 39.9 % (ref 36.0–46.0)
Hemoglobin: 13.3 g/dL (ref 12.0–15.0)
MCHC: 33.3 g/dL (ref 30.0–36.0)
MCV: 93.4 fl (ref 78.0–100.0)
Platelets: 252 10*3/uL (ref 150.0–400.0)
RBC: 4.27 Mil/uL (ref 3.87–5.11)
RDW: 13.9 % (ref 11.5–15.5)
WBC: 6 10*3/uL (ref 4.0–10.5)

## 2018-07-09 LAB — VITAMIN D 25 HYDROXY (VIT D DEFICIENCY, FRACTURES): VITD: 8.89 ng/mL — ABNORMAL LOW (ref 30.00–100.00)

## 2018-07-09 NOTE — Patient Instructions (Signed)
Tinnitus  Tinnitus refers to hearing a sound when there is no actual source for that sound. This is often described as ringing in the ears. However, people with this condition may hear a variety of noises, in one ear or in both ears.  The sounds of tinnitus can be soft, loud, or somewhere in between. Tinnitus can last for a few seconds or can be constant for days. It may go away without treatment and come back at various times. When tinnitus is constant or happens often, it can lead to other problems, such as trouble sleeping and trouble concentrating.  Almost everyone experiences tinnitus at some point. Tinnitus that is long-lasting (chronic) or comes back often (recurs) may require medical attention.  What are the causes?  The cause of tinnitus is often not known. In some cases, it can result from other problems or conditions, including:  · Exposure to loud noises from machinery, music, or other sources.  · Hearing loss.  · Ear or sinus infections.  · Earwax buildup.  · An object (foreign body) stuck in the ear.  · Taking certain medicines.  · Drinking alcohol or caffeine.  · High blood pressure.  · Heart diseases.  · Anemia.  · Allergies.  · Meniere's disease.  · Thyroid problems.  · Tumors.  · A weak, bulging blood vessel (aneurysm) near the ear.  · Depression or other mood disorders.  What are the signs or symptoms?  The main symptom of tinnitus is hearing a sound when there is no source for that sound. It may sound like:  · Buzzing.  · Roaring.  · Ringing.  · Blowing air, like the sound heard when you listen to a seashell.  · Hissing.  · Whistling.  · Sizzling.  · Humming.  · Running water.  · A musical note.  · Tapping.  Symptoms may affect only one ear (unilateral) or both ears (bilateral).  How is this diagnosed?  Tinnitus is diagnosed based on your symptoms, your medical history, and a physical exam. Your health care provider may do a thorough hearing test (audiologic exam) if your tinnitus:  · Is  unilateral.  · Causes hearing difficulties.  · Lasts 6 months or longer.  You may work with a health care provider who specializes in hearing disorders (audiologist). You may be asked questions about your symptoms and how they affect your daily life. You may have other tests done, such as:  · CT scan.  · MRI.  · An imaging test of how blood flows through your blood vessels (angiogram).  How is this treated?  Treating an underlying medical condition can sometimes make tinnitus go away. If your tinnitus continues, other treatments may include:  · Medicines, such as antidepressants or sleeping aids.  · Sound generators to mask the tinnitus. These include:  ? Tabletop sound machines that play relaxing sounds to help you fall asleep.  ? Wearable devices that fit in your ear and play sounds or music.  ? Acoustic neural stimulation. This involves using headphones to listen to music that contains an auditory signal. Over time, listening to this signal may change some pathways in your brain and make you less sensitive to tinnitus. This treatment is used for very severe cases when no other treatment is working.  · Therapy and counseling to help you manage the stress of living with tinnitus.  · Using hearing aids or cochlear implants if your tinnitus is related to hearing loss. Hearing aids are worn in   the outer ear. Cochlear implants are surgically placed in the inner ear.  Follow these instructions at home:  Managing symptoms         · When possible, avoid being in loud places and being exposed to loud sounds.  · Wear hearing protection, such as earplugs, when you are exposed to loud noises.  · Use a white noise machine, a humidifier, or other devices to mask the sound of tinnitus.  · Practice techniques for reducing stress, such as meditation, yoga, or deep breathing. Work with your health care provider if you need help with managing stress.  · Sleep with your head slightly raised. This may reduce the impact of  tinnitus.  General instructions  · Do not use stimulants, such as nicotine, alcohol, or caffeine. Talk with your health care provider about other stimulants to avoid. Stimulants are substances that can make you feel alert and attentive by increasing certain activities in the body (such as heart rate and blood pressure). These substances may make tinnitus worse.  · Take over-the-counter and prescription medicines only as told by your health care provider.  · Try to get plenty of sleep each night.  · Keep all follow-up visits as told by your health care provider. This is important.  Contact a health care provider if:  · Your tinnitus continues for 3 weeks or longer without stopping.  · Your symptoms get worse or do not get better with home care.  · You develop tinnitus after a head injury.  · You have tinnitus along with any of the following:  ? Dizziness.  ? Loss of balance.  ? Nausea and vomiting.  Summary  · Tinnitus refers to hearing a sound when there is no actual source for that sound. This is often described as ringing in the ears.  · Symptoms may affect only one ear (unilateral) or both ears (bilateral).  · Use a white noise machine, a humidifier, or other devices to mask the sound of tinnitus.  · Do not use stimulants, such as nicotine, alcohol, or caffeine. Talk with your health care provider about other stimulants to avoid. These substances may make tinnitus worse.  This information is not intended to replace advice given to you by your health care provider. Make sure you discuss any questions you have with your health care provider.  Document Released: 05/14/2005 Document Revised: 02/21/2017 Document Reviewed: 02/21/2017  Elsevier Interactive Patient Education © 2019 [employer]

## 2018-07-09 NOTE — Progress Notes (Signed)
Subjective:    Patient ID: Kristin Valdez, female    DOB: [birth year], 83 y.o.   MRN: [id number]  HPI   Patient presents to clinic due to continued plugged feeling in the left ear, now reporting bilateral buzzing/ringing in ears and some hearing loss ear.  Eustachian tube dysfunction was suspected at last visit and patient was started on oral Claritin and Flonase nasal spray.  Patient has been using these as prescribed, but has not noticed much of a difference in her symptoms.  Patient states she has never worked in a loud environment with lots of machinery.  States she did notice the buzzing and white noise type sensation in ears worsen when she was in a loud restaurant.   Patient has never been on many medications, so I do not suspect a ototoxic medication.  Patient has no history of high blood pressure or other vascular issues.  Patient Active Problem List   Diagnosis Date Noted  . Eustachian tube dysfunction 06/25/2018  . Seborrheic keratoses 06/25/2018  . Personal history of malignant neoplasm of breast 11/14/2016  . Adenocarcinoma of breast ([place]) 11/08/2016  . Routine general medical examination at a health care facility 09/21/2016  . Osteopenia 05/15/2016  . Primary cancer of upper outer quadrant of right female breast ([place]) 05/06/2016   Social History   Tobacco Use  . Smoking status: Former Smoker    Last attempt to quit: 01/09/1971    Years since quitting: 47.5  . Smokeless tobacco: Never Used  Substance Use Topics  . Alcohol use: Yes    Comment: Occasional     Review of Systems   Constitutional: Negative for chills, fatigue and fever.  HENT: Negative for congestion, ear pain, sinus pain and sore throat. +ringing/buzzing in both hears, some muffled/hearing loss in left ear Eyes: Negative.   Respiratory: Negative for cough, shortness of breath and wheezing.   Cardiovascular: Negative for chest pain, palpitations and leg swelling.  Gastrointestinal: Negative for  abdominal pain, diarrhea, nausea and vomiting.  Genitourinary: Negative for dysuria, frequency and urgency.  Musculoskeletal: Negative for arthralgias and myalgias.  Skin: Negative for color change, pallor and rash.  Neurological: Negative for syncope, light-headedness and headaches.  Psychiatric/Behavioral: The patient is not nervous/anxious.       Objective:   Physical Exam Vitals signs and nursing note reviewed.  Constitutional:      General: She is not in acute distress.    Appearance: She is not ill-appearing or toxic-appearing.  HENT:     Head: Normocephalic and atraumatic.     Right Ear: Tympanic membrane, ear canal and external ear normal. There is no impacted cerumen.     Left Ear: Tympanic membrane, ear canal and external ear normal. There is no impacted cerumen.     Ears:     Comments: Minimal pinkness bilat TMs. Do not appear bright red or bulging.     Nose: Nose normal.     Mouth/Throat:     Mouth: Mucous membranes are moist.     Pharynx: No oropharyngeal exudate or posterior oropharyngeal erythema.  Eyes:     General: No scleral icterus.    Extraocular Movements: Extraocular movements intact.     Conjunctiva/sclera: Conjunctivae normal.     Pupils: Pupils are equal, round, and reactive to light.  Neck:     Musculoskeletal: Neck supple. No neck rigidity.     Thyroid: No thyromegaly or thyroid tenderness.     Vascular: No carotid bruit.  Trachea: Trachea normal.  Cardiovascular:     Rate and Rhythm: Normal rate and regular rhythm.     Heart sounds: Normal heart sounds.  Pulmonary:     Effort: Pulmonary effort is normal. No respiratory distress.     Breath sounds: Normal breath sounds.  Lymphadenopathy:     Cervical: No cervical adenopathy.  Skin:    General: Skin is warm and dry.     Coloration: Skin is not jaundiced or pale.     Findings: No rash.  Neurological:     General: No focal deficit present.     Mental Status: She is alert and oriented to  person, place, and time.     Motor: No weakness.     Coordination: Coordination normal.     Gait: Gait normal.     Comments: Grips equal and strong. Smile symmetrical. Speech clear. Can raise eye brows, puff out cheeks and clench teeth without issue. Hearing for conversational voice normal.   Psychiatric:        Mood and Affect: Mood normal.        Behavior: Behavior normal.    Vitals:   07/09/18 1012  BP: 126/62  Pulse: 85  Resp: 16  Temp: 98.2 F (36.8 C)  SpO2: 93%      Assessment & Plan:   Tinnitus of both ears, hearing loss in left ear - unclear reason for patient's ringing in the ears.  We will do some blood work to rule out any anemia, electrolyte abnormalities, vitamin deficiencies and thyroid issues.  We will also do an ENT referral for further evaluation.  Explained to patient that sometimes the cause of tinnitus is not always able to be found and patients may learn to have to live with the disorder.  She will continue Claritin and Flonase to see if this even minimally helps to improve symptoms over time.  Blood work drawn in clinic today.  Patient aware that someone will call her in regards to ENT referral.  Advised if she does not hear from someone in the next 7 days about the referral, to call office for status update.

## 2018-07-10 LAB — THYROID PANEL WITH TSH
Free Thyroxine Index: 1.7 (ref 1.4–3.8)
T3 UPTAKE: 26 % (ref 22–35)
T4, Total: 6.5 ug/dL (ref 5.1–11.9)
TSH: 3.75 m[IU]/L (ref 0.40–4.50)

## 2018-07-10 MED ORDER — CYANOCOBALAMIN 500 MCG PO TABS: 500.0000 ug | ORAL_TABLET | Freq: Every day | ORAL | 0 refills | Status: DC

## 2018-07-10 MED ORDER — VITAMIN D (ERGOCALCIFEROL) 1.25 MG (50000 UNIT) PO CAPS: 50000.0000 [IU] | ORAL_CAPSULE | ORAL | 0 refills | Status: DC

## 2018-07-10 NOTE — Addendum Note (Signed)
Addended by: Philis Nettle on: 07/10/2018 01:08 PM   Modules accepted: Orders

## 2018-07-30 ENCOUNTER — Other Ambulatory Visit: Payer: Self-pay | Admitting: Physician Assistant

## 2018-07-30 DIAGNOSIS — IMO0001 Reserved for inherently not codable concepts without codable children: Secondary | ICD-10-CM

## 2018-07-30 DIAGNOSIS — H9319 Tinnitus, unspecified ear: Secondary | ICD-10-CM | POA: Diagnosis not present

## 2018-07-30 DIAGNOSIS — H9042 Sensorineural hearing loss, unilateral, left ear, with unrestricted hearing on the contralateral side: Secondary | ICD-10-CM

## 2018-07-30 DIAGNOSIS — H903 Sensorineural hearing loss, bilateral: Secondary | ICD-10-CM | POA: Diagnosis not present

## 2018-08-12 ENCOUNTER — Ambulatory Visit: Payer: Medicare HMO

## 2018-08-13 ENCOUNTER — Encounter (INDEPENDENT_AMBULATORY_CARE_PROVIDER_SITE_OTHER): Payer: Self-pay

## 2018-08-13 ENCOUNTER — Other Ambulatory Visit: Payer: Self-pay

## 2018-08-13 ENCOUNTER — Ambulatory Visit
Admission: RE | Admit: 2018-08-13 | Discharge: 2018-08-13 | Disposition: A | Discharge status: Home / Self Care | Payer: Medicare HMO | Source: Ambulatory Visit | Attending: Physician Assistant | Admitting: Physician Assistant

## 2018-08-13 DIAGNOSIS — H9042 Sensorineural hearing loss, unilateral, left ear, with unrestricted hearing on the contralateral side: Secondary | ICD-10-CM | POA: Diagnosis not present

## 2018-08-13 DIAGNOSIS — H9192 Unspecified hearing loss, left ear: Secondary | ICD-10-CM | POA: Diagnosis not present

## 2018-08-13 DIAGNOSIS — R51 Headache: Secondary | ICD-10-CM | POA: Diagnosis not present

## 2018-08-13 DIAGNOSIS — IMO0001 Reserved for inherently not codable concepts without codable children: Secondary | ICD-10-CM

## 2018-08-13 MED ORDER — GADOBUTROL 1 MMOL/ML IV SOLN
7.0000 mL | Freq: Once | INTRAVENOUS | Status: AC | PRN
  Administered 2018-08-13: 7 mL via INTRAVENOUS

## 2018-09-29 ENCOUNTER — Ambulatory Visit: Payer: Medicare HMO | Admitting: Family Medicine

## 2018-10-03 ENCOUNTER — Other Ambulatory Visit: Payer: Medicare HMO

## 2018-11-17 DIAGNOSIS — Z961 Presence of intraocular lens: Secondary | ICD-10-CM | POA: Diagnosis not present

## 2018-11-17 DIAGNOSIS — Z0100 Encounter for examination of eyes and vision without abnormal findings: Secondary | ICD-10-CM | POA: Diagnosis not present

## 2018-11-17 DIAGNOSIS — H40023 Open angle with borderline findings, high risk, bilateral: Secondary | ICD-10-CM | POA: Diagnosis not present

## 2018-11-17 DIAGNOSIS — H04123 Dry eye syndrome of bilateral lacrimal glands: Secondary | ICD-10-CM | POA: Diagnosis not present

## 2018-11-17 DIAGNOSIS — H02059 Trichiasis without entropian unspecified eye, unspecified eyelid: Secondary | ICD-10-CM | POA: Diagnosis not present

## 2018-11-26 ENCOUNTER — Ambulatory Visit: Payer: Medicare HMO | Admitting: Family Medicine

## 2018-12-23 ENCOUNTER — Ambulatory Visit: Payer: Medicare HMO | Admitting: Family Medicine

## 2019-01-28 ENCOUNTER — Ambulatory Visit: Payer: Medicare HMO | Admitting: Family Medicine

## 2019-04-01 ENCOUNTER — Other Ambulatory Visit: Payer: Self-pay

## 2019-04-03 ENCOUNTER — Encounter: Payer: Self-pay | Admitting: Family Medicine

## 2019-04-03 ENCOUNTER — Other Ambulatory Visit: Payer: Self-pay

## 2019-04-03 ENCOUNTER — Ambulatory Visit (INDEPENDENT_AMBULATORY_CARE_PROVIDER_SITE_OTHER): Payer: Medicare HMO | Admitting: Family Medicine

## 2019-04-03 VITALS — BP 120/70 | HR 87 | Temp 96.9°F | Ht 64.0 in | Wt 168.6 lb

## 2019-04-03 DIAGNOSIS — Z853 Personal history of malignant neoplasm of breast: Secondary | ICD-10-CM

## 2019-04-03 DIAGNOSIS — E538 Deficiency of other specified B group vitamins: Secondary | ICD-10-CM

## 2019-04-03 DIAGNOSIS — L989 Disorder of the skin and subcutaneous tissue, unspecified: Secondary | ICD-10-CM

## 2019-04-03 DIAGNOSIS — L821 Other seborrheic keratosis: Secondary | ICD-10-CM | POA: Diagnosis not present

## 2019-04-03 DIAGNOSIS — E559 Vitamin D deficiency, unspecified: Secondary | ICD-10-CM

## 2019-04-03 LAB — VITAMIN B12: Vitamin B-12: 235 pg/mL (ref 211–911)

## 2019-04-03 LAB — VITAMIN D 25 HYDROXY (VIT D DEFICIENCY, FRACTURES): VITD: 16.64 ng/mL — ABNORMAL LOW (ref 30.00–100.00)

## 2019-04-03 NOTE — Assessment & Plan Note (Signed)
Suspect seborrheic keratosis though could represent a nevus.  We will refer to dermatology.

## 2019-04-03 NOTE — Progress Notes (Signed)
  Tommi Rumps, MD Phone: [phone]  Kristin Valdez is a 83 y.o. female who presents today for same-day visit.  Skin lesion: Patient notes she has a growth on her forehead between her eyebrows for the last couple of years.  It has not grown recently though it has become irritated at times with some itching.  No pain.  No drainage.  She has tried clobetasol on it with no benefit.  She additionally has scattered skin lesions on her face that she uses clobetasol on occasionally.  History of breast cancer: Patient is status post bilateral mastectomies in 2017.  She notes no lesions since then.  She saw her surgeon 1 year after her surgery though has not followed up with them since.  She reports they advised her that her primary care physician could do her checks.  Vitamin D and B12 deficiency: Patient was on supplementation after her last visit though she has not been taking those recently after running out.  Social History   Tobacco Use  Smoking Status Former Smoker  . Quit date: 01/09/1971  . Years since quitting: 48.2  Smokeless Tobacco Never Used     ROS see history of present illness  Objective  Physical Exam Vitals:   04/03/19 0811  BP: 120/70  Pulse: 87  Temp: (!) 96.9 F (36.1 C)  SpO2: 95%    BP Readings from Last 3 Encounters:  04/03/19 120/70  07/09/18 126/62  06/25/18 116/78   Wt Readings from Last 3 Encounters:  04/03/19 168 lb 9.6 oz (76.5 kg)  07/09/18 170 lb (77.1 kg)  06/25/18 173 lb 3.2 oz (78.6 kg)    Physical Exam Constitutional:      General: She is not in acute distress.    Appearance: She is not diaphoretic.  HENT:     Head:   Cardiovascular:     Rate and Rhythm: Normal rate and regular rhythm.     Heart sounds: Normal heart sounds.  Pulmonary:     Effort: Pulmonary effort is normal.     Breath sounds: Normal breath sounds.  Genitourinary:    Comments: Chaperone used, bilateral breasts are surgically absent, scars are intact with no  palpable lesions, no palpable masses, tenderness, or concerning areas noted Skin:    General: Skin is warm and dry.  Neurological:     Mental Status: She is alert.      Assessment/Plan: Please see individual problem list.  Seborrheic keratoses Suspect seborrheic keratosis though could represent a nevus.  We will refer to dermatology.  Personal history of malignant neoplasm of breast Patient with breast cancer history.  Benign exam today.  Discussed that her surgeon had wanted her to follow-up with them though she prefers to have a yearly exam through her office.  She will be scheduled for 1 year.  B12 deficiency Recheck B12.  Vitamin D deficiency Recheck vitamin D.   Orders Placed This Encounter  Procedures  . B12  . Vitamin D (25 hydroxy)  . Ambulatory referral to Dermatology    Referral Priority:   Routine    Referral Type:   Consultation    Referral Reason:   Specialty Services Required    Requested Specialty:   Dermatology    Number of Visits Requested:   1    No orders of the defined types were placed in this encounter.    Tommi Rumps, MD [place]

## 2019-04-03 NOTE — Assessment & Plan Note (Signed)
Patient with breast cancer history.  Benign exam today.  Discussed that her surgeon had wanted her to follow-up with them though she prefers to have a yearly exam through her office.  She will be scheduled for 1 year.

## 2019-04-03 NOTE — Patient Instructions (Addendum)
Nice to see you. We will get you see dermatology. If you notice any new lumps or tenderness or any changes in your chest area please let us know immediately.

## 2019-04-03 NOTE — Assessment & Plan Note (Signed)
Recheck vitamin D

## 2019-04-03 NOTE — Assessment & Plan Note (Signed)
Recheck B12 

## 2019-04-15 ENCOUNTER — Other Ambulatory Visit: Payer: Self-pay

## 2019-04-15 DIAGNOSIS — E559 Vitamin D deficiency, unspecified: Secondary | ICD-10-CM

## 2019-04-17 ENCOUNTER — Other Ambulatory Visit: Payer: Self-pay

## 2019-04-17 DIAGNOSIS — E559 Vitamin D deficiency, unspecified: Secondary | ICD-10-CM

## 2019-04-17 MED ORDER — VITAMIN D (ERGOCALCIFEROL) 1.25 MG (50000 UNIT) PO CAPS: 50000.0000 [IU] | ORAL_CAPSULE | ORAL | 0 refills | Status: DC

## 2019-04-17 NOTE — Telephone Encounter (Signed)
Refill request and lab results notes indicated to send the vitamin d to pharmacy and this was done today.  Nazli Penn,cma

## 2019-04-19 ENCOUNTER — Other Ambulatory Visit: Payer: Self-pay | Admitting: Internal Medicine

## 2019-04-19 DIAGNOSIS — Z1389 Encounter for screening for other disorder: Secondary | ICD-10-CM

## 2019-04-19 DIAGNOSIS — E559 Vitamin D deficiency, unspecified: Secondary | ICD-10-CM

## 2019-04-19 DIAGNOSIS — Z13220 Encounter for screening for lipoid disorders: Secondary | ICD-10-CM

## 2019-04-19 DIAGNOSIS — Z853 Personal history of malignant neoplasm of breast: Secondary | ICD-10-CM

## 2019-04-19 DIAGNOSIS — E538 Deficiency of other specified B group vitamins: Secondary | ICD-10-CM

## 2019-04-19 DIAGNOSIS — R7303 Prediabetes: Secondary | ICD-10-CM

## 2019-04-20 ENCOUNTER — Other Ambulatory Visit: Payer: Self-pay | Admitting: Family Medicine

## 2019-04-20 DIAGNOSIS — E559 Vitamin D deficiency, unspecified: Secondary | ICD-10-CM

## 2019-04-20 NOTE — Telephone Encounter (Signed)
Medication: Vitamin D, Ergocalciferol, (DRISDOL) 1.25 MG (50000 UT) CAPS capsule [id number] - Patient is requesting this to be resent. The Pharmacy never received this.  Has the patient contacted their pharmacy? Yes  (Agent: If no, request that the patient contact the pharmacy for the refill.) (Agent: If yes, when and what did the pharmacy advise?)  Preferred Pharmacy (with phone number or street name): [place], [place]. [phone] (Phone) [phone] (Fax)   Agent: Please be advised that RX refills may take up to 3 business days. We ask that you follow-up with your pharmacy.

## 2019-04-21 NOTE — Telephone Encounter (Signed)
Requested medication (s) are due for refill today: yes  Requested medication (s) are on the active medication list: yes  Last refill:   Future visit scheduled: yes  Notes to clinic:  Not delegated   Requested Prescriptions  Pending Prescriptions Disp Refills   Vitamin D, Ergocalciferol, (DRISDOL) 1.25 MG (50000 UT) CAPS capsule 12 capsule 0    Sig: Take 1 capsule (50,000 Units total) by mouth every 7 (seven) days.     Endocrinology:  Vitamins - Vitamin D Supplementation Failed - 04/20/2019  5:27 PM      Failed - 50,000 IU strengths are not delegated      Failed - Phosphate in normal range and within 360 days    No results found for: PHOS       Failed - Vitamin D in normal range and within 360 days    VITD  Date Value Ref Range Status  04/03/2019 16.64 (L) 30.00 - 100.00 ng/mL Final         Passed - Ca in normal range and within 360 days    Calcium  Date Value Ref Range Status  07/09/2018 9.6 8.4 - 10.5 mg/dL Final   Calcium, Total  Date Value Ref Range Status  12/02/2013 9.3 8.5 - 10.1 mg/dL Final         Passed - Valid encounter within last 12 months    Recent Outpatient Visits          2 weeks ago Skin lesion   [place] Leone Haven, MD   9 months ago Tinnitus of both ears   [facility] [place] Guse, Jacquelynn Cree, FNP   10 months ago Dysfunction of left eustachian tube   [facility] Leone Haven, MD   1 year ago Routine general medical examination at a health care facility   [facility] Leone Haven, MD   2 years ago Pain of right thumb   [facility] Kimberly, Barnie Del, DO      Future Appointments            In 2 months O'Brien-Blaney, Bryson Corona, [place], [place]   In 2 months Caryl Bis, Angela Adam, MD [facility], [facility]

## 2019-04-22 ENCOUNTER — Other Ambulatory Visit: Payer: Self-pay

## 2019-04-22 ENCOUNTER — Ambulatory Visit (INDEPENDENT_AMBULATORY_CARE_PROVIDER_SITE_OTHER): Payer: Medicare HMO

## 2019-04-22 DIAGNOSIS — E538 Deficiency of other specified B group vitamins: Secondary | ICD-10-CM | POA: Diagnosis not present

## 2019-04-22 MED ORDER — CYANOCOBALAMIN 1000 MCG/ML IJ SOLN
1000.0000 ug | Freq: Once | INTRAMUSCULAR | Status: AC
  Administered 2019-04-22: 1000 ug via INTRAMUSCULAR

## 2019-04-22 NOTE — Progress Notes (Signed)
Patient presented today for first B12 injection per MD order (See lab note from 04/03/19).  Patient reported having a double mastectomy so injection was given IM in right upper outer quadrant of gluteal.  Patient tolerated well with no signs of distress.

## 2019-04-29 ENCOUNTER — Ambulatory Visit (INDEPENDENT_AMBULATORY_CARE_PROVIDER_SITE_OTHER): Payer: Medicare HMO | Admitting: *Deleted

## 2019-04-29 ENCOUNTER — Other Ambulatory Visit: Payer: Self-pay

## 2019-04-29 DIAGNOSIS — E538 Deficiency of other specified B group vitamins: Secondary | ICD-10-CM

## 2019-04-29 MED ORDER — CYANOCOBALAMIN 1000 MCG/ML IJ SOLN
1000.0000 ug | Freq: Once | INTRAMUSCULAR | Status: AC
  Administered 2019-04-29: 1000 ug via INTRAMUSCULAR

## 2019-04-29 NOTE — Progress Notes (Signed)
Patient presented for B 12 injection to [facility], patient voiced no concerns nor showed any signs of distress during injection

## 2019-05-06 ENCOUNTER — Ambulatory Visit (INDEPENDENT_AMBULATORY_CARE_PROVIDER_SITE_OTHER): Payer: Medicare HMO | Admitting: *Deleted

## 2019-05-06 ENCOUNTER — Other Ambulatory Visit: Payer: Self-pay

## 2019-05-06 DIAGNOSIS — E538 Deficiency of other specified B group vitamins: Secondary | ICD-10-CM

## 2019-05-06 MED ORDER — CYANOCOBALAMIN 1000 MCG/ML IJ SOLN
1000.0000 ug | Freq: Once | INTRAMUSCULAR | Status: AC
  Administered 2019-05-06: 1000 ug via INTRAMUSCULAR

## 2019-05-06 NOTE — Progress Notes (Signed)
Patient presented for B 12 injection to [facility], patient voiced no concerns nor showed any signs of distress during injection.

## 2019-05-11 DIAGNOSIS — L820 Inflamed seborrheic keratosis: Secondary | ICD-10-CM | POA: Diagnosis not present

## 2019-05-11 DIAGNOSIS — D2239 Melanocytic nevi of other parts of face: Secondary | ICD-10-CM | POA: Diagnosis not present

## 2019-05-11 DIAGNOSIS — L570 Actinic keratosis: Secondary | ICD-10-CM | POA: Diagnosis not present

## 2019-06-15 ENCOUNTER — Other Ambulatory Visit: Payer: Self-pay

## 2019-06-15 ENCOUNTER — Encounter: Payer: Self-pay | Admitting: Internal Medicine

## 2019-06-15 ENCOUNTER — Other Ambulatory Visit (INDEPENDENT_AMBULATORY_CARE_PROVIDER_SITE_OTHER): Payer: Medicare HMO

## 2019-06-15 DIAGNOSIS — Z13220 Encounter for screening for lipoid disorders: Secondary | ICD-10-CM

## 2019-06-15 DIAGNOSIS — E559 Vitamin D deficiency, unspecified: Secondary | ICD-10-CM | POA: Diagnosis not present

## 2019-06-15 DIAGNOSIS — Z1389 Encounter for screening for other disorder: Secondary | ICD-10-CM | POA: Diagnosis not present

## 2019-06-15 DIAGNOSIS — Z853 Personal history of malignant neoplasm of breast: Secondary | ICD-10-CM | POA: Diagnosis not present

## 2019-06-15 DIAGNOSIS — E538 Deficiency of other specified B group vitamins: Secondary | ICD-10-CM | POA: Diagnosis not present

## 2019-06-15 DIAGNOSIS — R7303 Prediabetes: Secondary | ICD-10-CM

## 2019-06-15 DIAGNOSIS — E785 Hyperlipidemia, unspecified: Secondary | ICD-10-CM | POA: Insufficient documentation

## 2019-06-15 LAB — CBC WITH DIFFERENTIAL/PLATELET
Basophils Absolute: 0 10*3/uL (ref 0.0–0.1)
Basophils Relative: 0.5 % (ref 0.0–3.0)
Eosinophils Absolute: 0.1 10*3/uL (ref 0.0–0.7)
Eosinophils Relative: 1.1 % (ref 0.0–5.0)
HCT: 40.4 % (ref 36.0–46.0)
Hemoglobin: 13.2 g/dL (ref 12.0–15.0)
Lymphocytes Relative: 30.6 % (ref 12.0–46.0)
Lymphs Abs: 2.1 10*3/uL (ref 0.7–4.0)
MCHC: 32.8 g/dL (ref 30.0–36.0)
MCV: 94.7 fl (ref 78.0–100.0)
Monocytes Absolute: 0.5 10*3/uL (ref 0.1–1.0)
Monocytes Relative: 7.6 % (ref 3.0–12.0)
Neutro Abs: 4.1 10*3/uL (ref 1.4–7.7)
Neutrophils Relative %: 60.2 % (ref 43.0–77.0)
Platelets: 251 10*3/uL (ref 150.0–400.0)
RBC: 4.27 Mil/uL (ref 3.87–5.11)
RDW: 13.5 % (ref 11.5–15.5)
WBC: 6.9 10*3/uL (ref 4.0–10.5)

## 2019-06-15 LAB — COMPREHENSIVE METABOLIC PANEL
ALT: 28 U/L (ref 0–35)
AST: 21 U/L (ref 0–37)
Albumin: 4.3 g/dL (ref 3.5–5.2)
Alkaline Phosphatase: 89 U/L (ref 39–117)
BUN: 18 mg/dL (ref 6–23)
CO2: 26 mEq/L (ref 19–32)
Calcium: 9.5 mg/dL (ref 8.4–10.5)
Chloride: 104 mEq/L (ref 96–112)
Creatinine, Ser: 0.83 mg/dL (ref 0.40–1.20)
GFR: 65.88 mL/min (ref >60.00)
Glucose, Bld: 107 mg/dL — ABNORMAL HIGH (ref 70–99)
Potassium: 4.8 mEq/L (ref 3.5–5.1)
Sodium: 138 mEq/L (ref 135–145)
Total Bilirubin: 0.4 mg/dL (ref 0.2–1.2)
Total Protein: 7.7 g/dL (ref 6.0–8.3)

## 2019-06-15 LAB — LIPID PANEL
Cholesterol: 242 mg/dL — ABNORMAL HIGH (ref 0–200)
HDL: 71.4 mg/dL (ref >39.00)
LDL Cholesterol: 147 mg/dL — ABNORMAL HIGH (ref 0–99)
NonHDL: 170.64
Total CHOL/HDL Ratio: 3
Triglycerides: 118 mg/dL (ref 0.0–149.0)
VLDL: 23.6 mg/dL (ref 0.0–40.0)

## 2019-06-15 LAB — VITAMIN D 25 HYDROXY (VIT D DEFICIENCY, FRACTURES): VITD: 22.78 ng/mL — ABNORMAL LOW (ref 30.00–100.00)

## 2019-06-15 LAB — HEMOGLOBIN A1C: Hgb A1c MFr Bld: 6 % (ref 4.6–6.5)

## 2019-06-15 LAB — VITAMIN B12: Vitamin B-12: 315 pg/mL (ref 211–911)

## 2019-06-16 LAB — URINALYSIS, ROUTINE W REFLEX MICROSCOPIC
Bilirubin Urine: NEGATIVE
Glucose, UA: NEGATIVE
Hgb urine dipstick: NEGATIVE
Ketones, ur: NEGATIVE
Leukocytes,Ua: NEGATIVE
Nitrite: NEGATIVE
Protein, ur: NEGATIVE
Specific Gravity, Urine: 1.003 (ref 1.001–1.03)
pH: 6.5 (ref 5.0–8.0)

## 2019-06-18 DIAGNOSIS — H40023 Open angle with borderline findings, high risk, bilateral: Secondary | ICD-10-CM | POA: Diagnosis not present

## 2019-06-18 DIAGNOSIS — H02059 Trichiasis without entropian unspecified eye, unspecified eyelid: Secondary | ICD-10-CM | POA: Diagnosis not present

## 2019-06-18 DIAGNOSIS — Z961 Presence of intraocular lens: Secondary | ICD-10-CM | POA: Diagnosis not present

## 2019-06-18 DIAGNOSIS — H04123 Dry eye syndrome of bilateral lacrimal glands: Secondary | ICD-10-CM | POA: Diagnosis not present

## 2019-06-22 DIAGNOSIS — L820 Inflamed seborrheic keratosis: Secondary | ICD-10-CM | POA: Diagnosis not present

## 2019-06-22 DIAGNOSIS — L821 Other seborrheic keratosis: Secondary | ICD-10-CM | POA: Diagnosis not present

## 2019-06-22 DIAGNOSIS — L578 Other skin changes due to chronic exposure to nonionizing radiation: Secondary | ICD-10-CM | POA: Diagnosis not present

## 2019-06-29 ENCOUNTER — Ambulatory Visit (INDEPENDENT_AMBULATORY_CARE_PROVIDER_SITE_OTHER): Payer: Medicare HMO

## 2019-06-29 ENCOUNTER — Encounter: Payer: Self-pay | Admitting: Family Medicine

## 2019-06-29 ENCOUNTER — Ambulatory Visit (INDEPENDENT_AMBULATORY_CARE_PROVIDER_SITE_OTHER): Payer: Medicare HMO | Admitting: Family Medicine

## 2019-06-29 ENCOUNTER — Other Ambulatory Visit: Payer: Self-pay

## 2019-06-29 VITALS — Ht 64.0 in | Wt 168.0 lb

## 2019-06-29 DIAGNOSIS — Z0000 Encounter for general adult medical examination without abnormal findings: Secondary | ICD-10-CM | POA: Diagnosis not present

## 2019-06-29 DIAGNOSIS — L821 Other seborrheic keratosis: Secondary | ICD-10-CM

## 2019-06-29 DIAGNOSIS — E785 Hyperlipidemia, unspecified: Secondary | ICD-10-CM | POA: Diagnosis not present

## 2019-06-29 DIAGNOSIS — E559 Vitamin D deficiency, unspecified: Secondary | ICD-10-CM

## 2019-06-29 DIAGNOSIS — R7303 Prediabetes: Secondary | ICD-10-CM

## 2019-06-29 MED ORDER — VITAMIN D (ERGOCALCIFEROL) 1.25 MG (50000 UNIT) PO CAPS: 50000.0000 [IU] | ORAL_CAPSULE | ORAL | 0 refills | Status: DC

## 2019-06-29 NOTE — Progress Notes (Signed)
Virtual Visit via telephone Note  This visit type was conducted due to national recommendations for restrictions regarding the COVID-19 pandemic (e.g. social distancing).  This format is felt to be most appropriate for this patient at this time.  All issues noted in this document were discussed and addressed.  No physical exam was performed (except for noted visual exam findings with Video Visits).   I connected with Kristin Valdez today at 10:30 AM EST by telephone and verified that I am speaking with the correct person using two identifiers. Location patient: home Location provider: work  Persons participating in the virtual visit: patient, provider  I discussed the limitations, risks, security and privacy concerns of performing an evaluation and management service by telephone and the availability of in person appointments. I also discussed with the patient that there may be a patient responsible charge related to this service. The patient expressed understanding and agreed to proceed.  Interactive audio and video telecommunications were attempted between this provider and patient, however failed, due to patient having technical difficulties OR patient did not have access to video capability.  We continued and completed visit with audio only.   Reason for visit: follow-up  HPI: Prediabetes: No polyuria or polydipsia.  Diet consists of lots of fruits and vegetables and mostly chicken.  She has eliminated bread.  Notes some exercise each morning.  Works 2 days a week and is active while at work.  Hyperlipidemia: Cholesterol has been relatively stable.  She has no history of stroke or heart attack.  She has never been on medication for this.  Skin lesion: Patient notes she saw dermatology and had this removed from her forehead.  She notes it was not cancerous.  She did have cryotherapy on several precancerous areas.  She notes her underwear has been healing well.  Vitamin D deficiency: This  improved with supplementation though is not in the normal range.  She is taking prescription supplementation.    ROS: See pertinent positives and negatives per HPI.  Past Medical History:  Diagnosis Date  . Adenocarcinoma of breast ([place]) 11/08/2016   Overview:  No chemo/XRT 2/2 localized lesion.  Unable to tolerate Tamoxifen.  . Breast cancer ([place]) 2014-2015   left breast cancer  . Chickenpox   . 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 ABDOMINAL HYSTERECTOMY    . APPENDECTOMY    . BREAST BIOPSY Left    Stereotactic biopsy  . BREAST BIOPSY Right 05/02/2016   US guided biopsy  . MASTECTOMY Left 2015  . MASTECTOMY W/ SENTINEL NODE BIOPSY Right 05/24/2016   Procedure: MASTECTOMY WITH SENTINEL LYMPH NODE BIOPSY;  Surgeon: Hubbard Robinson, MD;  Location: [facility] ORS;  Service: General;  Laterality: Right;    Family History  Problem Relation Age of Onset  . Heart disease Mother   . Prostate cancer Father   . Stroke Brother   . Healthy Daughter     SOCIAL HX: Former smoker   Current Outpatient Medications:  .  clobetasol cream (TEMOVATE) [id number] %, Apply 1 application topically 2 (two) times daily., Disp: 30 g, Rfl: 0 .  neomycin-polymyxin-dexameth (MAXITROL) 0.1 % OINT, neomycin 3.5 mg/g-polymyxin B 10,000 unit/g-dexameth 0.1 % eye oint, Disp: , Rfl:  .  timolol (TIMOPTIC) 0.5 % ophthalmic solution, timolol maleate 0.5 % eye drops, Disp: , Rfl:  .  Vitamin D, Ergocalciferol, (DRISDOL) 1.25 MG (50000 UNIT) CAPS  capsule, Take 1 capsule (50,000 Units total) by mouth every 7 (seven) days., Disp: 12 capsule, Rfl: 0 .  vitamin B-12 (CYANOCOBALAMIN) 500 MCG tablet, Take 1 tablet (500 mcg total) by mouth daily. (Patient not taking: Reported on 06/29/2019), Disp: 90 tablet, Rfl: 0  EXAM: This was a telehealth telephone visit and thus no physical exam was completed.  ASSESSMENT AND  PLAN:  Discussed the following assessment and plan:  Seborrheic keratoses Underwent removal by dermatology.  She will monitor the areas that were frozen and if not healing well she will follow up with dermatology.  HLD (hyperlipidemia) Given age and lack of stroke or heart attack history she does not require medication.  She will continue to work on diet and exercise.  Prediabetes Continue diet and exercise.  Vitamin D deficiency Improved though not in the normal range.  We will continue with prescription supplementation for another 12 weeks and then recheck vitamin D labs.   Orders Placed This Encounter  Procedures  . Vitamin D (25 hydroxy)    Standing Status:   Future    Standing Expiration Date:   06/28/2020    Meds ordered this encounter  Medications  . Vitamin D, Ergocalciferol, (DRISDOL) 1.25 MG (50000 UNIT) CAPS capsule    Sig: Take 1 capsule (50,000 Units total) by mouth every 7 (seven) days.    Dispense:  12 capsule    Refill:  0     I discussed the assessment and treatment plan with the patient. The patient was provided an opportunity to ask questions and all were answered. The patient agreed with the plan and demonstrated an understanding of the instructions.   The patient was advised to call back or seek an in-person evaluation if the symptoms worsen or if the condition fails to improve as anticipated.  I provided 12 minutes of non-face-to-face time during this encounter.   Tommi Rumps, MD

## 2019-06-29 NOTE — Progress Notes (Signed)
Subjective:   Kristin Valdez is a 84 y.o. female who presents for Medicare Annual (Subsequent) preventive examination.  Review of Systems:  No ROS.  Medicare Wellness Virtual Visit.  Visual/audio telehealth visit, UTA vital signs.  Ht/Wt provided.   See social history for additional risk factors.   Cardiac Risk Factors include: advanced age (>18men, >10 women)     Objective:     Vitals: Ht 5\' 4"  (1.626 m)   Wt 168 lb (76.2 kg)   BMI 28.84 kg/m   Body mass index is 28.84 kg/m.  Advanced Directives 06/29/2019 06/25/2018 06/24/2017 11/29/2016 08/13/2016 06/07/2016 05/24/2016  Does Patient Have a Medical Advance Directive? Yes Yes Yes No Yes Yes No;Yes  Type of Paramedic of [place];Living will [place];Living will - [place];Living will Living will;Healthcare Power of Attorney Living will  Does patient want to make changes to medical advance directive? No - Patient declined No - Patient declined No - Patient declined - - - No - Patient declined  Copy of [place] in Chart? No - copy requested No - copy requested No - copy requested - No - copy requested - -    Tobacco Social History   Tobacco Use  Smoking Status Former Smoker  . Quit date: 01/09/1971  . Years since quitting: 48.5  Smokeless Tobacco Never Used     Counseling given: Not Answered   Clinical Intake:  Pre-visit preparation completed: Yes        Diabetes: No  How often do you need to have someone help you when you read instructions, pamphlets, or other written materials from your doctor or pharmacy?: 1 - Never  Interpreter Needed?: No     Past Medical History:  Diagnosis Date  . Breast cancer ([place]) 2014-2015   left breast cancer  . Chickenpox    Past Surgical History:  Procedure Laterality Date  . ABDOMINAL HYSTERECTOMY    . APPENDECTOMY    . BREAST BIOPSY Left    Stereotactic biopsy  .  BREAST BIOPSY Right 05/02/2016   US guided biopsy  . MASTECTOMY Left 2015  . MASTECTOMY W/ SENTINEL NODE BIOPSY Right 05/24/2016   Procedure: MASTECTOMY WITH SENTINEL LYMPH NODE BIOPSY;  Surgeon: Hubbard Robinson, MD;  Location: [facility] ORS;  Service: General;  Laterality: Right;   Family History  Problem Relation Age of Onset  . Heart disease Mother   . Prostate cancer Father   . Stroke Brother   . Healthy Daughter    Social History   Socioeconomic History  . Marital status: Widowed    Spouse name: Not on file  . Number of children: Not on file  . Years of education: Not on file  . Highest education level: Not on file  Occupational History  . Not on file  Tobacco Use  . Smoking status: Former Smoker    Quit date: 01/09/1971    Years since quitting: 48.5  . Smokeless tobacco: Never Used  Substance and Sexual Activity  . Alcohol use: Yes    Comment: Occasional  . Drug use: No  . Sexual activity: Never  Other Topics Concern  . Not on file  Social History Narrative   Works 2-3 days a week at [employer].   Social Determinants of Health   Financial Resource Strain:   . Difficulty of Paying Living Expenses: Not on file  Food Insecurity:   . Worried About Estate manager/land agent  of Food in the Last Year: Not on file  . Ran Out of Food in the Last Year: Not on file  Transportation Needs:   . Lack of Transportation (Medical): Not on file  . Lack of Transportation (Non-Medical): Not on file  Physical Activity:   . Days of Exercise per Week: Not on file  . Minutes of Exercise per Session: Not on file  Stress:   . Feeling of Stress : Not on file  Social Connections:   . Frequency of Communication with Friends and Family: Not on file  . Frequency of Social Gatherings with Friends and Family: Not on file  . Attends Religious Services: Not on file  . Active Member of Clubs or Organizations: Not on file  . Attends Archivist Meetings: Not on file  . Marital Status: Not on  file    Outpatient Encounter Medications as of 06/29/2019  Medication Sig  . clobetasol cream (TEMOVATE) AB-123456789 % Apply 1 application topically 2 (two) times daily.  . vitamin B-12 (CYANOCOBALAMIN) 500 MCG tablet Take 1 tablet (500 mcg total) by mouth daily. (Patient not taking: Reported on 04/03/2019)  . Vitamin D, Ergocalciferol, (DRISDOL) 1.25 MG (50000 UT) CAPS capsule Take 1 capsule (50,000 Units total) by mouth every 7 (seven) days.   No facility-administered encounter medications on file as of 06/29/2019.    Activities of Daily Living In your present state of health, do you have any difficulty performing the following activities: 06/29/2019  Hearing? Y  Comment L ear hearing aid  Vision? N  Difficulty concentrating or making decisions? N  Walking or climbing stairs? N  Dressing or bathing? N  Doing errands, shopping? N  Preparing Food and eating ? N  Using the Toilet? N  In the past six months, have you accidently leaked urine? N  Do you have problems with loss of bowel control? N  Managing your Medications? N  Managing your Finances? N  Housekeeping or managing your Housekeeping? N  Some recent data might be hidden    Patient Care Team: Leone Haven, MD as PCP - General (Family Medicine)    Assessment:   This is a routine wellness examination for [name].  Nurse connected with patient 06/29/19 at 10:00 AM EST by a telephone enabled telemedicine application and verified that I am speaking with the correct person using two identifiers. Patient stated full name and DOB. Patient gave permission to continue with virtual visit. Patient's location was at home and Nurse's location was at [name] office.   Patient is alert and oriented x3. Patient denies difficulty focusing or concentrating. Patient works as a Engineer, petroleum which assists with brain stimulation.   Health Maintenance Due: See completed HM at the end of note.   Eye: Visual acuity not assessed. Virtual  visit. Followed by their ophthalmologist.  Dental: Dentures- partial  Hearing: Hearing aids- L ear  Safety:  Patient feels safe at home- yes Patient does have smoke detectors at home- yes Patient does wear sunscreen or protective clothing when in direct sunlight - yes Patient does wear seat belt when in a moving vehicle - yes Patient drives- yes Adequate lighting in walkways free from debris- yes Grab bars and handrails used as appropriate- yes Ambulates with an assistive device- no Cell phone on person when ambulating outside of the home- yes  Social: Alcohol intake - yes      Smoking history- former  Smokers in home? none Illicit drug use? none  Medication:  Taking as directed and without issues.  Self managed - yes   Covid-19: Precautions and sickness symptoms discussed. Wears mask, social distancing, hand hygiene as appropriate.   Activities of Daily Living Patient denies needing assistance with: household chores, feeding themselves, getting from bed to chair, getting to the toilet, bathing/showering, dressing, managing money, or preparing meals.   Discussed the importance of a healthy diet, water intake and the benefits of aerobic exercise.  Physical activity- active around the home. Walking, no routine.   Diet:  Regular Water: good intake  Caffeine: 1-2 cups of tea  Other Providers Patient Care Team: Leone Haven, MD as PCP - General (Family Medicine)  Exercise Activities and Dietary recommendations Current Exercise Habits: Home exercise routine, Type of exercise: walking, Intensity: Mild  Goals    . Maintain Healthy Lifestyle     Stay active Stay hydrated Exercise        Fall Risk Fall Risk  06/29/2019 04/03/2019 06/25/2018 06/24/2017 11/29/2016  Falls in the past year? 0 0 0 No Yes  Number falls in past yr: - 0 - - 1  Injury with Fall? - - - - Yes  Follow up Falls prevention discussed Falls evaluation completed - - Falls evaluation completed    Timed Get Up and Go performed: no, virtual visit  Depression Screen PHQ 2/9 Scores 06/29/2019 04/03/2019 06/25/2018 06/24/2017  PHQ - 2 Score 0 0 0 0  Exception Documentation - - - -     Cognitive Function     6CIT Screen 06/29/2019 06/25/2018 06/24/2017  What Year? 0 points 0 points 0 points  What month? 0 points 0 points 0 points  What time? 0 points 0 points 0 points  Count back from 20 0 points 0 points 0 points  Months in reverse 0 points 0 points 0 points  Repeat phrase 0 points 0 points 0 points  Total Score 0 0 0    Immunization History  Administered Date(s) Administered  . Tdap 10/29/2013   Screening Tests Health Maintenance  Topic Date Due  . TETANUS/TDAP  10/30/2023  . DEXA SCAN  Completed  . INFLUENZA VACCINE  Discontinued      Plan:   Keep all routine maintenance appointments.   Follow up with your doctor today @ 10:30.  Medicare Attestation I have personally reviewed: The patient's medical and social history Their use of alcohol, tobacco or illicit drugs Their current medications and supplements The patient's functional ability including ADLs,fall risks, home safety risks, cognitive, and hearing and visual impairment Diet and physical activities Evidence for depression   I have reviewed and discussed with patient certain preventive protocols, quality metrics, and best practice recommendations.     Varney Biles, LPN  [id number]

## 2019-06-29 NOTE — Patient Instructions (Addendum)
  Kristin Valdez , Thank you for taking time to come for your Medicare Wellness Visit. I appreciate your ongoing commitment to your health goals. Please review the following plan we discussed and let me know if I can assist you in the future.   These are the goals we discussed: Goals    . Maintain Healthy Lifestyle     Stay active Stay hydrated Exercise        This is a list of the screening recommended for you and due dates:  Health Maintenance  Topic Date Due  . Tetanus Vaccine  10/30/2023  . DEXA scan (bone density measurement)  Completed  . Flu Shot  Discontinued

## 2019-06-29 NOTE — Assessment & Plan Note (Signed)
Continue diet and exercise. 

## 2019-06-29 NOTE — Assessment & Plan Note (Signed)
Improved though not in the normal range.  We will continue with prescription supplementation for another 12 weeks and then recheck vitamin D labs.

## 2019-06-29 NOTE — Assessment & Plan Note (Signed)
Underwent removal by dermatology.  She will monitor the areas that were frozen and if not healing well she will follow up with dermatology.

## 2019-06-29 NOTE — Assessment & Plan Note (Signed)
Given age and lack of stroke or heart attack history she does not require medication.  She will continue to work on diet and exercise.

## 2019-06-30 ENCOUNTER — Telehealth: Payer: Self-pay | Admitting: Family Medicine

## 2019-06-30 NOTE — Telephone Encounter (Signed)
Lvm to set up 8m lab and 6 month f/u

## 2019-07-02 NOTE — Progress Notes (Signed)
I have reviewed the above note and agree.  Iliya Spivack, M.D.  

## 2019-07-07 DIAGNOSIS — Z0100 Encounter for examination of eyes and vision without abnormal findings: Secondary | ICD-10-CM | POA: Diagnosis not present

## 2019-09-28 ENCOUNTER — Other Ambulatory Visit: Payer: Self-pay

## 2019-09-28 ENCOUNTER — Other Ambulatory Visit (INDEPENDENT_AMBULATORY_CARE_PROVIDER_SITE_OTHER): Payer: Medicare HMO

## 2019-09-28 DIAGNOSIS — E559 Vitamin D deficiency, unspecified: Secondary | ICD-10-CM

## 2019-09-28 LAB — VITAMIN D 25 HYDROXY (VIT D DEFICIENCY, FRACTURES): VITD: 36.81 ng/mL (ref 30.00–100.00)

## 2019-09-29 ENCOUNTER — Telehealth: Payer: Self-pay

## 2019-10-01 ENCOUNTER — Telehealth: Payer: Self-pay

## 2019-10-01 DIAGNOSIS — E559 Vitamin D deficiency, unspecified: Secondary | ICD-10-CM

## 2019-10-01 NOTE — Telephone Encounter (Signed)
Patient called the office back and I informed her of her lab results, she understood and I ordered her future vitamin D lab and she scheduled labs in 2 months per the provider.  Kristin Valdez,cma

## 2019-10-01 NOTE — Telephone Encounter (Signed)
-----   Message from Leone Haven, MD sent at 09/29/2019 10:55 AM EDT ----- Patients vitamin D is acceptable. I would suggest she start on vitamin D 2000 IU over the counter daily. We should recheck this in 2 months. Thanks.

## 2019-12-14 ENCOUNTER — Other Ambulatory Visit: Payer: Self-pay

## 2019-12-14 ENCOUNTER — Other Ambulatory Visit (INDEPENDENT_AMBULATORY_CARE_PROVIDER_SITE_OTHER): Payer: Medicare HMO

## 2019-12-14 DIAGNOSIS — E559 Vitamin D deficiency, unspecified: Secondary | ICD-10-CM

## 2019-12-14 LAB — VITAMIN D 25 HYDROXY (VIT D DEFICIENCY, FRACTURES): VITD: 25.98 ng/mL — ABNORMAL LOW (ref 30.00–100.00)

## 2019-12-21 ENCOUNTER — Telehealth: Payer: Self-pay | Admitting: Family Medicine

## 2019-12-21 NOTE — Telephone Encounter (Signed)
Pt wants to know if she really needs 8/2 appt because she had lab work in July.

## 2019-12-21 NOTE — Telephone Encounter (Signed)
I would like to have the patient keep the office visit.

## 2019-12-21 NOTE — Telephone Encounter (Signed)
Pt wants to know if she really needs 8/2 appt because she had lab work in July. Bethannie Iglehart,cma

## 2019-12-22 NOTE — Telephone Encounter (Signed)
I called and spoke with the patient and informed her that the provider would like for her to keep the appt scheduled and she understood.  Ghada Abbett,cma

## 2019-12-28 ENCOUNTER — Other Ambulatory Visit: Payer: Self-pay

## 2019-12-28 ENCOUNTER — Encounter: Payer: Self-pay | Admitting: Family Medicine

## 2019-12-28 ENCOUNTER — Ambulatory Visit (INDEPENDENT_AMBULATORY_CARE_PROVIDER_SITE_OTHER): Payer: Medicare HMO | Admitting: Family Medicine

## 2019-12-28 VITALS — BP 117/80 | HR 88 | Temp 98.2°F | Ht 64.0 in | Wt 172.2 lb

## 2019-12-28 DIAGNOSIS — Z780 Asymptomatic menopausal state: Secondary | ICD-10-CM | POA: Diagnosis not present

## 2019-12-28 DIAGNOSIS — Z853 Personal history of malignant neoplasm of breast: Secondary | ICD-10-CM | POA: Diagnosis not present

## 2019-12-28 DIAGNOSIS — R7303 Prediabetes: Secondary | ICD-10-CM | POA: Diagnosis not present

## 2019-12-28 DIAGNOSIS — H6981 Other specified disorders of Eustachian tube, right ear: Secondary | ICD-10-CM

## 2019-12-28 DIAGNOSIS — M8580 Other specified disorders of bone density and structure, unspecified site: Secondary | ICD-10-CM | POA: Diagnosis not present

## 2019-12-28 DIAGNOSIS — H6991 Unspecified Eustachian tube disorder, right ear: Secondary | ICD-10-CM

## 2019-12-28 NOTE — Assessment & Plan Note (Signed)
Encouraged her to try Claritin.  If not improving she will let us know.

## 2019-12-28 NOTE — Assessment & Plan Note (Signed)
Exam completed today is acceptable.  We will continue to check once yearly.

## 2019-12-28 NOTE — Assessment & Plan Note (Addendum)
Encouraged vitamin D supplementation.  Recheck in 6 months.  She will call to schedule a DEXA scan.

## 2019-12-28 NOTE — Patient Instructions (Signed)
Nice to see you. Please try the Claritin to see if that will help with your ear.  If it is not improving the next several weeks please let us know. Please call to schedule bone density scan.

## 2019-12-28 NOTE — Progress Notes (Signed)
Tommi Rumps, MD Phone: [phone]  Kristin Valdez is a 84 y.o. female who presents today for follow-up.  Prediabetes: No polyuria or polydipsia.  She has minimal red meat.  Mostly chicken, vegetables, and fruit.  She has been decreasing her bread.  She does some leg lifts and exercises in the shower in the morning.  Vitamin D deficiency: Taking vitamin D 4000 international units.  Due for DEXA scan.  Tries to get calcium through milk.  Right ear fullness: This has been going on 1 week.  Started after she began using her hearing aid in her right ear again.  She has been using a decongestant with some benefit.  She has noted some decreased hearing in the right ear over the last 6 to 8 months.  Has chronic tinnitus bilaterally that is unchanged.  History of breast cancer: Status post bilateral mastectomies.  She has noted no changes.  Due for exam.  Social History   Tobacco Use  Smoking Status Former Smoker  . Quit date: 01/09/1971  . Years since quitting: 49.0  Smokeless Tobacco Never Used     ROS see history of present illness  Objective  Physical Exam Vitals:   12/28/19 1051  BP: 117/80  Pulse: 88  Temp: 98.2 F (36.8 C)  SpO2: 98%    BP Readings from Last 3 Encounters:  12/28/19 117/80  04/03/19 120/70  07/09/18 126/62   Wt Readings from Last 3 Encounters:  12/28/19 172 lb 3.2 oz (78.1 kg)  06/29/19 168 lb (76.2 kg)  06/29/19 168 lb (76.2 kg)    Physical Exam Constitutional:      General: She is not in acute distress.    Appearance: She is not diaphoretic.  HENT:     Right Ear: Tympanic membrane normal.     Left Ear: Tympanic membrane normal.  Cardiovascular:     Rate and Rhythm: Normal rate and regular rhythm.     Heart sounds: Normal heart sounds.  Pulmonary:     Effort: Pulmonary effort is normal.     Breath sounds: Normal breath sounds.  Genitourinary:    Comments: Kristin Valdez, CMA served as chaperone, bilateral chest wall with no masses or  tenderness, surgical scars appear well-healed with no lesions Skin:    General: Skin is warm and dry.  Neurological:     Mental Status: She is alert.      Assessment/Plan: Please see individual problem list.  Osteopenia Encouraged vitamin D supplementation.  Recheck in 6 months.  She will call to schedule a DEXA scan.  Prediabetes Continue diet and exercise.  Personal history of malignant neoplasm of breast Exam completed today is acceptable.  We will continue to check once yearly.  Eustachian tube dysfunction, right Encouraged her to try Claritin.  If not improving she will let us know.   Orders Placed This Encounter  Procedures  . DG Bone Density    Standing Status:   Future    Standing Expiration Date:   12/27/2020    Order Specific Question:   Reason for Exam (SYMPTOM  OR DIAGNOSIS REQUIRED)    Answer:   Postmenopausal estrogen deficiency    Order Specific Question:   Preferred imaging location?    Answer:   [facility]  . Vitamin D (25 hydroxy)    Standing Status:   Future    Standing Expiration Date:   12/27/2020    No orders of the defined types were placed in this encounter.   This visit occurred  during the SARS-CoV-2 public health emergency.  Safety protocols were in place, including screening questions prior to the visit, additional usage of staff PPE, and extensive cleaning of exam room while observing appropriate contact time as indicated for disinfecting solutions.    Tommi Rumps, MD [place]

## 2019-12-28 NOTE — Assessment & Plan Note (Signed)
Continue diet and exercise. 

## 2020-01-20 ENCOUNTER — Other Ambulatory Visit: Payer: Self-pay

## 2020-01-20 ENCOUNTER — Ambulatory Visit
Admission: RE | Admit: 2020-01-20 | Discharge: 2020-01-20 | Disposition: A | Discharge status: Home / Self Care | Payer: Medicare HMO | Source: Ambulatory Visit | Attending: Family Medicine | Admitting: Family Medicine

## 2020-01-20 DIAGNOSIS — R29890 Loss of height: Secondary | ICD-10-CM | POA: Diagnosis not present

## 2020-01-20 DIAGNOSIS — Z780 Asymptomatic menopausal state: Secondary | ICD-10-CM | POA: Insufficient documentation

## 2020-01-20 DIAGNOSIS — M8580 Other specified disorders of bone density and structure, unspecified site: Secondary | ICD-10-CM | POA: Insufficient documentation

## 2020-01-20 DIAGNOSIS — M85852 Other specified disorders of bone density and structure, left thigh: Secondary | ICD-10-CM | POA: Diagnosis not present

## 2020-01-28 ENCOUNTER — Other Ambulatory Visit: Payer: Self-pay | Admitting: Family Medicine

## 2020-01-28 MED ORDER — ALENDRONATE SODIUM 70 MG PO TABS: 70.0000 mg | ORAL_TABLET | ORAL | 11 refills | Status: DC

## 2020-06-16 DIAGNOSIS — H40023 Open angle with borderline findings, high risk, bilateral: Secondary | ICD-10-CM | POA: Diagnosis not present

## 2020-06-16 DIAGNOSIS — Z0100 Encounter for examination of eyes and vision without abnormal findings: Secondary | ICD-10-CM | POA: Diagnosis not present

## 2020-06-16 DIAGNOSIS — H04123 Dry eye syndrome of bilateral lacrimal glands: Secondary | ICD-10-CM | POA: Diagnosis not present

## 2020-06-16 DIAGNOSIS — Z961 Presence of intraocular lens: Secondary | ICD-10-CM | POA: Diagnosis not present

## 2020-06-16 DIAGNOSIS — H02059 Trichiasis without entropian unspecified eye, unspecified eyelid: Secondary | ICD-10-CM | POA: Diagnosis not present

## 2020-06-22 ENCOUNTER — Ambulatory Visit: Payer: Medicare HMO | Admitting: Dermatology

## 2020-06-22 ENCOUNTER — Other Ambulatory Visit: Payer: Self-pay

## 2020-06-22 DIAGNOSIS — L821 Other seborrheic keratosis: Secondary | ICD-10-CM | POA: Diagnosis not present

## 2020-06-22 DIAGNOSIS — Z872 Personal history of diseases of the skin and subcutaneous tissue: Secondary | ICD-10-CM

## 2020-06-22 DIAGNOSIS — L578 Other skin changes due to chronic exposure to nonionizing radiation: Secondary | ICD-10-CM | POA: Diagnosis not present

## 2020-06-22 DIAGNOSIS — L820 Inflamed seborrheic keratosis: Secondary | ICD-10-CM | POA: Diagnosis not present

## 2020-06-22 NOTE — Progress Notes (Unsigned)
   Follow-Up Visit   Subjective  Kristin Valdez is a 85 y.o. female who presents for the following: Follow-up (1 year follow up of ISKs of face treated with LN2. History of Aks of face treated with LN2 in the past.).  The following portions of the chart were reviewed this encounter and updated as appropriate:   Tobacco  Allergies  Meds  Problems  Med Hx  Surg Hx  Fam Hx     Review of Systems:  No other skin or systemic complaints except as noted in HPI or Assessment and Plan.  Objective  Well appearing patient in no apparent distress; mood and affect are within normal limits.  A focused examination was performed including face. Relevant physical exam findings are noted in the Assessment and Plan.  Objective  Right nose x 1, left glabella x 1 (2): Erythematous keratotic or waxy stuck-on papule or plaque.   Objective  Head - Anterior (Face): Clear today  Objective  Head - Anterior (Face): Stuck-on, waxy, tan-brown papule or plaque --Discussed benign etiology and prognosis.    Assessment & Plan    Actinic Damage - chronic, secondary to cumulative UV radiation exposure/sun exposure over time - diffuse scaly erythematous macules with underlying dyspigmentation - Recommend daily broad spectrum sunscreen SPF 30+ to sun-exposed areas, reapply every 2 hours as needed.  - Call for new or changing lesions.  Inflamed seborrheic keratosis (2) Right nose x 1, left glabella x 1  Destruction of lesion - Right nose x 1, left glabella x 1 Complexity: simple   Destruction method: cryotherapy   Informed consent: discussed and consent obtained   Timeout:  patient name, date of birth, surgical site, and procedure verified Lesion destroyed using liquid nitrogen: Yes   Region frozen until ice ball extended beyond lesion: Yes   Outcome: patient tolerated procedure well with no complications   Post-procedure details: wound care instructions given    History of actinic keratoses Head -  Anterior (Face)  Clear. Observe for recurrence. Call clinic for new or changing lesions.  Recommend regular skin exams, daily broad-spectrum spf 30+ sunscreen use, and photoprotection.     Seborrheic keratosis Head - Anterior (Face)  Benign, observe.    Return in about 3 months (around 09/20/2020).  Documentation: I have reviewed the above documentation for accuracy and completeness, and I agree with the above.  Sarina Ser, MD

## 2020-06-22 NOTE — Patient Instructions (Signed)

## 2020-06-24 ENCOUNTER — Encounter: Payer: Self-pay | Admitting: Dermatology

## 2020-06-27 ENCOUNTER — Other Ambulatory Visit: Payer: Self-pay

## 2020-06-29 ENCOUNTER — Ambulatory Visit (INDEPENDENT_AMBULATORY_CARE_PROVIDER_SITE_OTHER): Payer: Medicare HMO

## 2020-06-29 ENCOUNTER — Other Ambulatory Visit: Payer: Self-pay

## 2020-06-29 ENCOUNTER — Telehealth: Payer: Self-pay

## 2020-06-29 ENCOUNTER — Encounter: Payer: Self-pay | Admitting: Family Medicine

## 2020-06-29 ENCOUNTER — Ambulatory Visit (INDEPENDENT_AMBULATORY_CARE_PROVIDER_SITE_OTHER): Payer: Medicare HMO | Admitting: Family Medicine

## 2020-06-29 VITALS — Ht 64.0 in | Wt 173.0 lb

## 2020-06-29 DIAGNOSIS — E559 Vitamin D deficiency, unspecified: Secondary | ICD-10-CM

## 2020-06-29 DIAGNOSIS — E538 Deficiency of other specified B group vitamins: Secondary | ICD-10-CM | POA: Diagnosis not present

## 2020-06-29 DIAGNOSIS — M8580 Other specified disorders of bone density and structure, unspecified site: Secondary | ICD-10-CM | POA: Diagnosis not present

## 2020-06-29 DIAGNOSIS — R7303 Prediabetes: Secondary | ICD-10-CM

## 2020-06-29 DIAGNOSIS — M79675 Pain in left toe(s): Secondary | ICD-10-CM | POA: Insufficient documentation

## 2020-06-29 DIAGNOSIS — Z0000 Encounter for general adult medical examination without abnormal findings: Secondary | ICD-10-CM

## 2020-06-29 LAB — HEMOGLOBIN A1C: Hgb A1c MFr Bld: 6.1 % (ref 4.6–6.5)

## 2020-06-29 LAB — COMPREHENSIVE METABOLIC PANEL
ALT: 24 U/L (ref 0–35)
AST: 18 U/L (ref 0–37)
Albumin: 4 g/dL (ref 3.5–5.2)
Alkaline Phosphatase: 82 U/L (ref 39–117)
BUN: 16 mg/dL (ref 6–23)
CO2: 29 mEq/L (ref 19–32)
Calcium: 9.7 mg/dL (ref 8.4–10.5)
Chloride: 103 mEq/L (ref 96–112)
Creatinine, Ser: 0.84 mg/dL (ref 0.40–1.20)
GFR: 64.63 mL/min (ref >60.00)
Glucose, Bld: 99 mg/dL (ref 70–99)
Potassium: 4.4 mEq/L (ref 3.5–5.1)
Sodium: 138 mEq/L (ref 135–145)
Total Bilirubin: 0.4 mg/dL (ref 0.2–1.2)
Total Protein: 6.9 g/dL (ref 6.0–8.3)

## 2020-06-29 LAB — VITAMIN D 25 HYDROXY (VIT D DEFICIENCY, FRACTURES): VITD: 36.09 ng/mL (ref 30.00–100.00)

## 2020-06-29 LAB — VITAMIN B12: Vitamin B-12: 206 pg/mL — ABNORMAL LOW (ref 211–911)

## 2020-06-29 NOTE — Progress Notes (Signed)
Tommi Rumps, MD Phone: [phone]  Kristin Valdez is a 85 y.o. female who presents today for f/u.  Osteoporosis: Patient notes she does not want to take the Fosamax.  She is not interested in any treatment for this at this time.  She just didn't feel right taking the Fosamax.  She is taking vitamin D and is currently taking 4000 international units once daily.  She is not taking any calcium supplementation.  Prediabetes: No polyuria or polydipsia.  Diet is generally healthy with lots of vegetables and lean meats.  Occasional junk food.  No soda or sweet tea.  She does leg lifts daily.  Left middle toe pain: This started about 2 weeks ago.  There is a white strip on the toenail.  She started using Neosporin and the white strip went away.  She notes a little redness of the skin at the base of the toenail.  She does note she feels as though it has gotten better in the last day or so.  No known injury to the toe.  Social History   Tobacco Use  Smoking Status Former Smoker  . Quit date: 01/09/1971  . Years since quitting: 49.5  Smokeless Tobacco Never Used    Current Outpatient Medications on File Prior to Visit  Medication Sig Dispense Refill  . Cholecalciferol (VITAMIN D3) 50 MCG (2000 UT) CAPS Take 4,000 Units by mouth daily in the afternoon.     No current facility-administered medications on file prior to visit.     ROS see history of present illness  Objective  Physical Exam Vitals:   06/29/20 0834  BP: 120/80  Pulse: 83  Temp: 98.2 F (36.8 C)  SpO2: 98%    BP Readings from Last 3 Encounters:  06/29/20 120/80  12/28/19 117/80  04/03/19 120/70   Wt Readings from Last 3 Encounters:  06/29/20 173 lb 3.2 oz (78.6 kg)  12/28/19 172 lb 3.2 oz (78.1 kg)  06/29/19 168 lb (76.2 kg)    Physical Exam Constitutional:      General: She is not in acute distress.    Appearance: She is not diaphoretic.  Cardiovascular:     Rate and Rhythm: Normal rate and regular  rhythm.     Heart sounds: Normal heart sounds.  Pulmonary:     Effort: Pulmonary effort is normal.     Breath sounds: Normal breath sounds.  Musculoskeletal:        General: No edema.  Skin:    General: Skin is warm and dry.     Comments: Left middle toe with slight loosening of the toenail, there is slight erythema of the skin at the base of the toenail, there is no warmth, no induration, no tenderness  Neurological:     Mental Status: She is alert.      Assessment/Plan: Please see individual problem list.  Problem List Items Addressed This Visit    B12 deficiency    Recheck B12.      Relevant Orders   B12   Osteopenia    Patient has declined any treatment for this.  She will continue vitamin D supplementation.  Discussed adequate calcium intake of at least 1200 mg/day.  She'll pay attention to her dietary intake and if it is not adequate she will add a supplement of calcium.      Prediabetes    Check A1c.  Continue healthy diet and exercise.      Relevant Orders   Comp Met (CMET)   HgB  A1c   Toe pain, left    I suspect possible injury or chronic irritation given exam.  Her toenail is slightly loose.  There is some erythema though there are no other signs to indicate infection.  I suspect inflammation and irritation.  Discussed that she could continue the Neosporin.  She'll monitor.  If she has any spreading redness she'll let us know we can treat with an antibiotic.      Vitamin D deficiency    Recheck vitamin D.  Continue vitamin D supplementation.      Relevant Orders   Vitamin D (25 hydroxy)       Health Maintenance: Patient declines flu and Covid vaccine.     This visit occurred during the SARS-CoV-2 public health emergency.  Safety protocols were in place, including screening questions prior to the visit, additional usage of staff PPE, and extensive cleaning of exam room while observing appropriate contact time as indicated for disinfecting solutions.     Tommi Rumps, MD [place]

## 2020-06-29 NOTE — Patient Instructions (Addendum)
Kristin Valdez , Thank you for taking time to come for your Medicare Wellness Visit. I appreciate your ongoing commitment to your health goals. Please review the following plan we discussed and let me know if I can assist you in the future.   These are the goals we discussed: Goals    . Maintain Healthy Lifestyle     Stay active Stay hydrated Exercise        This is a list of the screening recommended for you and due dates:  Health Maintenance  Topic Date Due  . COVID-19 Vaccine (1) Never done  . Tetanus Vaccine  10/30/2023  . DEXA scan (bone density measurement)  Completed  . Flu Shot  Discontinued   Immunizations Immunization History  Administered Date(s) Administered  . Tdap 10/29/2013   Advanced directives: End of life planning; Advance aging; Advanced directives discussed.  Copy of current HCPOA/Living Will requested.    Conditions/risks identified: none new.  Follow up in one year for your annual wellness visit.   Preventive Care 83 Years and Older, Female Preventive care refers to lifestyle choices and visits with your health care provider that can promote health and wellness. What does preventive care include?  A yearly physical exam. This is also called an annual well check.  Dental exams once or twice a year.  Routine eye exams. Ask your health care provider how often you should have your eyes checked.  Personal lifestyle choices, including:  Daily care of your teeth and gums.  Regular physical activity.  Eating a healthy diet.  Avoiding tobacco and drug use.  Limiting alcohol use.  Practicing safe sex.  Taking low-dose aspirin every day.  Taking vitamin and mineral supplements as recommended by your health care provider. What happens during an annual well check? The services and screenings done by your health care provider during your annual well check will depend on your age, overall health, lifestyle risk factors, and family history of  disease. Counseling  Your health care provider may ask you questions about your:  Alcohol use.  Tobacco use.  Drug use.  Emotional well-being.  Home and relationship well-being.  Sexual activity.  Eating habits.  History of falls.  Memory and ability to understand (cognition).  Work and work Statistician.  Reproductive health. Screening  You may have the following tests or measurements:  Height, weight, and BMI.  Blood pressure.  Lipid and cholesterol levels. These may be checked every 5 years, or more frequently if you are over 74 years old.  Skin check.  Lung cancer screening. You may have this screening every year starting at age 83 if you have a 30-pack-year history of smoking and currently smoke or have quit within the past 15 years.  Fecal occult blood test (FOBT) of the stool. You may have this test every year starting at age 31.  Flexible sigmoidoscopy or colonoscopy. You may have a sigmoidoscopy every 5 years or a colonoscopy every 10 years starting at age 83.  Hepatitis C blood test.  Hepatitis B blood test.  Sexually transmitted disease (STD) testing.  Diabetes screening. This is done by checking your blood sugar (glucose) after you have not eaten for a while (fasting). You may have this done every 83 years.  Bone density scan. This is done to screen for osteoporosis. You may have this done starting at age 83.  Mammogram. This may be done every 1-2 years. Talk to your health care provider about how often you should have regular mammograms.  Talk with your health care provider about your test results, treatment options, and if necessary, the need for more tests. Vaccines  Your health care provider may recommend certain vaccines, such as:  Influenza vaccine. This is recommended every year.  Tetanus, diphtheria, and acellular pertussis (Tdap, Td) vaccine. You may need a Td booster every 10 years.  Zoster vaccine. You may need this after age  83.  Pneumococcal 13-valent conjugate (PCV13) vaccine. One dose is recommended after age 83.  Pneumococcal polysaccharide (PPSV23) vaccine. One dose is recommended after age 83. Talk to your health care provider about which screenings and vaccines you need and how often you need them. This information is not intended to replace advice given to you by your health care provider. Make sure you discuss any questions you have with your health care provider. Document Released: 06/10/2015 Document Revised: 02/01/2016 Document Reviewed: 03/15/2015 Elsevier Interactive Patient Education  2017 [place] Prevention in the Home Falls can cause injuries. They can happen to people of all ages. There are many things you can do to make your home safe and to help prevent falls. What can I do on the outside of my home?  Regularly fix the edges of walkways and driveways and fix any cracks.  Remove anything that might make you trip as you walk through a door, such as a raised step or threshold.  Trim any bushes or trees on the path to your home.  Use bright outdoor lighting.  Clear any walking paths of anything that might make someone trip, such as rocks or tools.  Regularly check to see if handrails are loose or broken. Make sure that both sides of any steps have handrails.  Any raised decks and porches should have guardrails on the edges.  Have any leaves, snow, or ice cleared regularly.  Use sand or salt on walking paths during winter.  Clean up any spills in your garage right away. This includes oil or grease spills. What can I do in the bathroom?  Use night lights.  Install grab bars by the toilet and in the tub and shower. Do not use towel bars as grab bars.  Use non-skid mats or decals in the tub or shower.  If you need to sit down in the shower, use a plastic, non-slip stool.  Keep the floor dry. Clean up any water that spills on the floor as soon as it happens.  Remove  soap buildup in the tub or shower regularly.  Attach bath mats securely with double-sided non-slip rug tape.  Do not have throw rugs and other things on the floor that can make you trip. What can I do in the bedroom?  Use night lights.  Make sure that you have a light by your bed that is easy to reach.  Do not use any sheets or blankets that are too big for your bed. They should not hang down onto the floor.  Have a firm chair that has side arms. You can use this for support while you get dressed.  Do not have throw rugs and other things on the floor that can make you trip. What can I do in the kitchen?  Clean up any spills right away.  Avoid walking on wet floors.  Keep items that you use a lot in easy-to-reach places.  If you need to reach something above you, use a strong step stool that has a grab bar.  Keep electrical cords out of the way.  Do not use floor polish or wax that makes floors slippery. If you must use wax, use non-skid floor wax.  Do not have throw rugs and other things on the floor that can make you trip. What can I do with my stairs?  Do not leave any items on the stairs.  Make sure that there are handrails on both sides of the stairs and use them. Fix handrails that are broken or loose. Make sure that handrails are as long as the stairways.  Check any carpeting to make sure that it is firmly attached to the stairs. Fix any carpet that is loose or worn.  Avoid having throw rugs at the top or bottom of the stairs. If you do have throw rugs, attach them to the floor with carpet tape.  Make sure that you have a light switch at the top of the stairs and the bottom of the stairs. If you do not have them, ask someone to add them for you. What else can I do to help prevent falls?  Wear shoes that:  Do not have high heels.  Have rubber bottoms.  Are comfortable and fit you well.  Are closed at the toe. Do not wear sandals.  If you use a  stepladder:  Make sure that it is fully opened. Do not climb a closed stepladder.  Make sure that both sides of the stepladder are locked into place.  Ask someone to hold it for you, if possible.  Clearly mark and make sure that you can see:  Any grab bars or handrails.  First and last steps.  Where the edge of each step is.  Use tools that help you move around (mobility aids) if they are needed. These include:  Canes.  Walkers.  Scooters.  Crutches.  Turn on the lights when you go into a dark area. Replace any light bulbs as soon as they burn out.  Set up your furniture so you have a clear path. Avoid moving your furniture around.  If any of your floors are uneven, fix them.  If there are any pets around you, be aware of where they are.  Review your medicines with your doctor. Some medicines can make you feel dizzy. This can increase your chance of falling. Ask your doctor what other things that you can do to help prevent falls. This information is not intended to replace advice given to you by your health care provider. Make sure you discuss any questions you have with your health care provider. Document Released: 03/10/2009 Document Revised: 10/20/2015 Document Reviewed: 06/18/2014 Elsevier Interactive Patient Education  2017 [employer].

## 2020-06-29 NOTE — Assessment & Plan Note (Signed)
Check A1c.  Continue healthy diet and exercise.  

## 2020-06-29 NOTE — Assessment & Plan Note (Signed)
Recheck vitamin D.  Continue vitamin D supplementation.

## 2020-06-29 NOTE — Telephone Encounter (Signed)
Unsuccessful attempt to reach patient for scheduled awv. No answer. Left message to call the office back and reschedule at her convenience.

## 2020-06-29 NOTE — Assessment & Plan Note (Signed)
Recheck B12 

## 2020-06-29 NOTE — Assessment & Plan Note (Signed)
Patient has declined any treatment for this.  She will continue vitamin D supplementation.  Discussed adequate calcium intake of at least 1200 mg/day.  She'll pay attention to her dietary intake and if it is not adequate she will add a supplement of calcium.

## 2020-06-29 NOTE — Progress Notes (Addendum)
Subjective:   Kristin Valdez is a 85 y.o. female who presents for Medicare Annual (Subsequent) preventive examination.  Review of Systems    No ROS.  Medicare Wellness Virtual Visit.    Cardiac Risk Factors include: advanced age (>70men, >68 women)     Objective:    Today's Vitals   06/29/20 1408  Weight: 173 lb (78.5 kg)  Height: 5\' 4"  (1.626 m)   Body mass index is 29.7 kg/m.  Advanced Directives 06/29/2020 06/29/2019 06/25/2018 06/24/2017 11/29/2016 08/13/2016 06/07/2016  Does Patient Have a Medical Advance Directive? Yes Yes Yes Yes No Yes Yes  Type of Paramedic of [place];Living will Healthcare Power of [place];Living will [place];Living will - [place];Living will Living will;Healthcare Power of Attorney  Does patient want to make changes to medical advance directive? No - Patient declined No - Patient declined No - Patient declined No - Patient declined - - -  Copy of [place] in Chart? No - copy requested No - copy requested No - copy requested No - copy requested - No - copy requested -    Current Medications (verified) Outpatient Encounter Medications as of 06/29/2020  Medication Sig  . Cholecalciferol (VITAMIN D3) 50 MCG (2000 UT) CAPS Take 4,000 Units by mouth daily in the afternoon.   No facility-administered encounter medications on file as of 06/29/2020.    Allergies (verified) Excedrin extra strength [asa-apap-caff buffered]   History: Past Medical History:  Diagnosis Date  . Adenocarcinoma of breast ([place]) 11/08/2016   Overview:  No chemo/XRT 2/2 localized lesion.  Unable to tolerate Tamoxifen.  . Breast cancer ([place]) 2014-2015   left breast cancer  . Chickenpox   . 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 ABDOMINAL HYSTERECTOMY    . APPENDECTOMY    .  BREAST BIOPSY Left    Stereotactic biopsy  . BREAST BIOPSY Right 05/02/2016   US guided biopsy  . MASTECTOMY Left 2015  . MASTECTOMY W/ SENTINEL NODE BIOPSY Right 05/24/2016   Procedure: MASTECTOMY WITH SENTINEL LYMPH NODE BIOPSY;  Surgeon: Hubbard Robinson, MD;  Location: [facility] ORS;  Service: General;  Laterality: Right;   Family History  Problem Relation Age of Onset  . Heart disease Mother   . Prostate cancer Father   . Stroke Brother   . Healthy Daughter    Social History   Socioeconomic History  . Marital status: Widowed    Spouse name: Not on file  . Number of children: Not on file  . Years of education: Not on file  . Highest education level: Not on file  Occupational History  . Not on file  Tobacco Use  . Smoking status: Former Smoker    Quit date: 01/09/1971    Years since quitting: 49.5  . Smokeless tobacco: Never Used  Vaping Use  . Vaping Use: Never used  Substance and Sexual Activity  . Alcohol use: Yes    Comment: Occasional  . Drug use: No  . Sexual activity: Never  Other Topics Concern  . Not on file  Social History Narrative   Works 2-3 days a week at [employer].   Social Determinants of Health   Financial Resource Strain: Low Risk   . Difficulty of Paying Living Expenses: Not hard at all  Food Insecurity: No Food Insecurity  . Worried  About Running Out of Food in the Last Year: Never true  . Ran Out of Food in the Last Year: Never true  Transportation Needs: No Transportation Needs  . Lack of Transportation (Medical): No  . Lack of Transportation (Non-Medical): No  Physical Activity: Not on file  Stress: No Stress Concern Present  . Feeling of Stress : Not at all  Social Connections: Unknown  . Frequency of Communication with Friends and Family: More than three times a week  . Frequency of Social Gatherings with Friends and Family: More than three times a week  . Attends Religious Services: Not on file  . Active Member of Clubs or  Organizations: Not on file  . Attends Archivist Meetings: Not on file  . Marital Status: Not on file    Tobacco Counseling Counseling given: Not Answered   Clinical Intake:  Pre-visit preparation completed: Yes        Diabetes: No  How often do you need to have someone help you when you read instructions, pamphlets, or other written materials from your doctor or pharmacy?: 1 - Never   Interpreter Needed?: No      Activities of Daily Living In your present state of health, do you have any difficulty performing the following activities: 06/29/2020  Hearing? Y  Vision? N  Difficulty concentrating or making decisions? N  Walking or climbing stairs? N  Dressing or bathing? N  Doing errands, shopping? N  Preparing Food and eating ? N  Using the Toilet? N  In the past six months, have you accidently leaked urine? N  Do you have problems with loss of bowel control? N  Managing your Medications? N  Managing your Finances? N  Housekeeping or managing your Housekeeping? N  Some recent data might be hidden    Patient Care Team: Leone Haven, MD as PCP - General (Family Medicine)  Indicate any recent Medical Services you may have received from other than [facility] providers in the past year (date may be approximate).     Assessment:   This is a routine wellness examination for Kristin Valdez.  I connected with Kristin Valdez today by telephone and verified that I am speaking with the correct person using two identifiers. Location patient: home Location provider: work Persons participating in the virtual visit: patient, Marine scientist.    I discussed the limitations, risks, security and privacy concerns of performing an evaluation and management service by telephone and the availability of in person appointments. The patient expressed understanding and verbally consented to this telephonic visit.    Interactive audio and video telecommunications were attempted between this provider and  patient, however failed, due to patient having technical difficulties OR patient did not have access to video capability.  We continued and completed visit with audio only.  Some vital signs may be absent or patient reported.   Hearing/Vision screen  Hearing Screening   125Hz  250Hz  500Hz  1000Hz  2000Hz  3000Hz  4000Hz  6000Hz  8000Hz   Right ear:           Left ear:           Comments: Hearing aid, L ear only  Vision Screening Comments: Followed by Sondra Barges  Virtual visit  Dietary issues and exercise activities discussed: Current Exercise Habits: Home exercise routine, Time (Minutes): 10, Frequency (Times/Week): 7, Weekly Exercise (Minutes/Week): 70, Intensity: Mild  Healthy diet Good water intake  Goals    . Maintain Healthy Lifestyle     Stay active Stay hydrated Exercise  Depression Screen PHQ 2/9 Scores 06/29/2020 12/28/2019 06/29/2019 04/03/2019 06/25/2018 06/24/2017 11/29/2016  PHQ - 2 Score 0 0 0 0 0 0 0  Exception Documentation - - - - - - Patient refusal    Fall Risk Fall Risk  06/29/2020 12/28/2019 12/28/2019 06/29/2019 04/03/2019  Falls in the past year? 0 0 0 0 0  Number falls in past yr: 0 0 0 - 0  Injury with Fall? - - - - -  Follow up Falls evaluation completed Falls evaluation completed - Falls prevention discussed Falls evaluation completed    Welcome: Handrails in use when climbing stairs? Yes Home free of loose throw rugs in walkways, pet beds, electrical cords, etc? Yes  Adequate lighting in your home to reduce risk of falls? Yes   ASSISTIVE DEVICES UTILIZED TO PREVENT FALLS: Use of a cane, walker or w/c? No   TIMED UP AND GO: Was the test performed? No . Virtual visit.   Cognitive Function:  Patient is alert x3 Denies difficulty focusing, making decisions, memory loss.  MMSE/6CIT deferred. Normal by direct communication/observation.    6CIT Screen 06/29/2019 06/25/2018 06/24/2017  What Year? 0 points 0 points 0 points  What  month? 0 points 0 points 0 points  What time? 0 points 0 points 0 points  Count back from 20 0 points 0 points 0 points  Months in reverse 0 points 0 points 0 points  Repeat phrase 0 points 0 points 0 points  Total Score 0 0 0    Immunizations Immunization History  Administered Date(s) Administered  . Tdap 10/29/2013   Health Maintenance Health Maintenance  Topic Date Due  . COVID-19 Vaccine (1) Never done  . TETANUS/TDAP  10/30/2023  . DEXA SCAN  Completed  . INFLUENZA VACCINE  Discontinued   Cologuard- 10/24/17.  Bone Density status: Completed 01/20/20. Results reflect: Bone density results: OSTEOPENIA. Repeat every 2 years.  Lung Cancer Screening: (Low Dose CT Chest recommended if Age 33-80 years, 30 pack-year currently smoking OR have quit w/in 15years.) does not qualify.   Hepatitis C Screening: does not qualify.    Vision Screening: Recommended annual ophthalmology exams for early detection of glaucoma and other disorders of the eye. Is the patient up to date with their annual eye exam?  Yes   Dental Screening: Recommended annual dental exams for proper oral hygiene.  Community Resource Referral / Chronic Care Management: CRR required this visit?  No   CCM required this visit?  No      Plan:   Keep all routine maintenance appointments.   I have personally reviewed and noted the following in the patient's chart:   . Medical and social history . Use of alcohol, tobacco or illicit drugs  . Current medications and supplements . Functional ability and status . Nutritional status . Physical activity . Advanced directives . List of other physicians . Hospitalizations, surgeries, and ER visits in previous 12 months . Vitals . Screenings to include cognitive, depression, and falls . Referrals and appointments  In addition, I have reviewed and discussed with patient certain preventive protocols, quality metrics, and best practice recommendations. A written  personalized care plan for preventive services as well as general preventive health recommendations were provided to patient via mail.     Varney Biles, LPN   08/31/2701

## 2020-06-29 NOTE — Assessment & Plan Note (Signed)
I suspect possible injury or chronic irritation given exam.  Her toenail is slightly loose.  There is some erythema though there are no other signs to indicate infection.  I suspect inflammation and irritation.  Discussed that she could continue the Neosporin.  She'll monitor.  If she has any spreading redness she'll let us know we can treat with an antibiotic.

## 2020-06-29 NOTE — Patient Instructions (Signed)
Nice to see you. We will check lab work today and contact you with results. Please monitor your toe and if you develop any spreading redness or worsening symptoms please let us know right away.

## 2020-07-01 ENCOUNTER — Telehealth: Payer: Self-pay | Admitting: Family Medicine

## 2020-07-01 NOTE — Telephone Encounter (Signed)
Called Kristin Valdez to ask if she has had the covid vaccine.  Kristin Valdez states she has not had the covid vaccine and does not want one either.

## 2020-07-20 ENCOUNTER — Other Ambulatory Visit: Payer: Self-pay

## 2020-07-20 ENCOUNTER — Ambulatory Visit (INDEPENDENT_AMBULATORY_CARE_PROVIDER_SITE_OTHER): Payer: Medicare HMO | Admitting: *Deleted

## 2020-07-20 DIAGNOSIS — E538 Deficiency of other specified B group vitamins: Secondary | ICD-10-CM | POA: Diagnosis not present

## 2020-07-20 MED ORDER — CYANOCOBALAMIN 1000 MCG/ML IJ SOLN
1000.0000 ug | Freq: Once | INTRAMUSCULAR | Status: AC
  Administered 2020-07-20: 1000 ug via INTRAMUSCULAR

## 2020-07-20 NOTE — Progress Notes (Signed)
Patient presented for B 12 injection to left deltoid, patient voiced no concerns nor showed any signs of distress during injection, see lab note from 07/04/20.

## 2020-07-27 ENCOUNTER — Ambulatory Visit (INDEPENDENT_AMBULATORY_CARE_PROVIDER_SITE_OTHER): Payer: Medicare HMO

## 2020-07-27 ENCOUNTER — Other Ambulatory Visit: Payer: Self-pay

## 2020-07-27 DIAGNOSIS — E538 Deficiency of other specified B group vitamins: Secondary | ICD-10-CM

## 2020-07-27 MED ORDER — CYANOCOBALAMIN 1000 MCG/ML IJ SOLN
1000.0000 ug | Freq: Once | INTRAMUSCULAR | Status: AC
  Administered 2020-07-27: 1000 ug via INTRAMUSCULAR

## 2020-07-27 NOTE — Progress Notes (Signed)
Patient presented for B 12 injection to left deltoid, patient voiced no concerns nor showed any signs of distress during injection. [name],cma  

## 2020-08-03 ENCOUNTER — Other Ambulatory Visit: Payer: Self-pay

## 2020-08-03 ENCOUNTER — Ambulatory Visit (INDEPENDENT_AMBULATORY_CARE_PROVIDER_SITE_OTHER): Payer: Medicare HMO | Admitting: *Deleted

## 2020-08-03 DIAGNOSIS — E538 Deficiency of other specified B group vitamins: Secondary | ICD-10-CM

## 2020-08-03 MED ORDER — CYANOCOBALAMIN 1000 MCG/ML IJ SOLN
1000.0000 ug | Freq: Once | INTRAMUSCULAR | Status: AC
  Administered 2020-08-03: 1000 ug via INTRAMUSCULAR

## 2020-08-03 NOTE — Progress Notes (Signed)
Patient presented for B 12 injection to right deltoid, patient voiced no concerns nor showed any signs of distress during injection. 

## 2020-08-10 ENCOUNTER — Other Ambulatory Visit: Payer: Self-pay

## 2020-08-10 ENCOUNTER — Ambulatory Visit (INDEPENDENT_AMBULATORY_CARE_PROVIDER_SITE_OTHER): Payer: Medicare HMO | Admitting: *Deleted

## 2020-08-10 DIAGNOSIS — E538 Deficiency of other specified B group vitamins: Secondary | ICD-10-CM | POA: Diagnosis not present

## 2020-08-10 MED ORDER — CYANOCOBALAMIN 1000 MCG/ML IJ SOLN
1000.0000 ug | Freq: Once | INTRAMUSCULAR | Status: AC
Start: 2020-08-10 — End: 2020-08-10
  Administered 2020-08-10: 1000 ug via INTRAMUSCULAR

## 2020-08-10 NOTE — Progress Notes (Addendum)
Patient presented for B 12 injection to Right Upper Outer Quadrant, patient voiced no concerns nor showed any signs of distress during injection. Please cosign PCP out of office.

## 2020-09-07 ENCOUNTER — Ambulatory Visit: Payer: Medicare HMO | Admitting: Dermatology

## 2020-09-07 ENCOUNTER — Encounter: Payer: Self-pay | Admitting: Dermatology

## 2020-09-07 ENCOUNTER — Other Ambulatory Visit: Payer: Self-pay

## 2020-09-07 DIAGNOSIS — L578 Other skin changes due to chronic exposure to nonionizing radiation: Secondary | ICD-10-CM | POA: Diagnosis not present

## 2020-09-07 DIAGNOSIS — L821 Other seborrheic keratosis: Secondary | ICD-10-CM | POA: Diagnosis not present

## 2020-09-07 DIAGNOSIS — L820 Inflamed seborrheic keratosis: Secondary | ICD-10-CM | POA: Diagnosis not present

## 2020-09-07 NOTE — Progress Notes (Signed)
   Follow-Up Visit   Subjective  Kristin Valdez is a 85 y.o. female who presents for the following: Follow-up (Inflamed SK follow up of right nose and left glabella treated with LN2. Both seem resolved.).  The following portions of the chart were reviewed this encounter and updated as appropriate:   Tobacco  Allergies  Meds  Problems  Med Hx  Surg Hx  Fam Hx     Review of Systems:  No other skin or systemic complaints except as noted in HPI or Assessment and Plan.  Objective  Well appearing patient in no apparent distress; mood and affect are within normal limits.  A focused examination was performed including face. Relevant physical exam findings are noted in the Assessment and Plan.  Objective  Face: Stuck-on, waxy, tan-brown papule or plaque --Discussed benign etiology and prognosis.   Objective  Left glabella, right nose: Clear today   Assessment & Plan    Actinic Damage - chronic, secondary to cumulative UV radiation exposure/sun exposure over time - diffuse scaly erythematous macules with underlying dyspigmentation - Recommend daily broad spectrum sunscreen SPF 30+ to sun-exposed areas, reapply every 2 hours as needed.  - Recommend staying in the shade or wearing long sleeves, sun glasses (UVA+UVB protection) and wide brim hats (4-inch brim around the entire circumference of the hat). - Call for new or changing lesions.  Seborrheic keratosis Face Benign, observe.    Inflamed seborrheic keratosis Left glabella, right nose S/P treatment Resolved  Return if symptoms worsen or fail to improve.   I, Ashok Cordia, CMA, am acting as scribe for Sarina Ser, MD .  Documentation: I have reviewed the above documentation for accuracy and completeness, and I agree with the above.  Sarina Ser, MD

## 2020-09-07 NOTE — Patient Instructions (Signed)

## 2020-09-12 ENCOUNTER — Other Ambulatory Visit: Payer: Self-pay

## 2020-09-12 ENCOUNTER — Ambulatory Visit (INDEPENDENT_AMBULATORY_CARE_PROVIDER_SITE_OTHER): Payer: Medicare HMO

## 2020-09-12 DIAGNOSIS — E538 Deficiency of other specified B group vitamins: Secondary | ICD-10-CM

## 2020-09-12 MED ORDER — CYANOCOBALAMIN 1000 MCG/ML IJ SOLN
1000.0000 ug | Freq: Once | INTRAMUSCULAR | Status: AC
Start: 2020-09-12 — End: 2020-09-12
  Administered 2020-09-12: 1000 ug via INTRAMUSCULAR

## 2020-09-12 NOTE — Progress Notes (Signed)
Patient presented for B 12 injection to right glute, patient voiced no concerns nor showed any signs of distress during injection.

## 2020-10-13 ENCOUNTER — Other Ambulatory Visit: Payer: Self-pay

## 2020-10-13 ENCOUNTER — Ambulatory Visit (INDEPENDENT_AMBULATORY_CARE_PROVIDER_SITE_OTHER): Payer: Medicare HMO | Admitting: *Deleted

## 2020-10-13 DIAGNOSIS — E538 Deficiency of other specified B group vitamins: Secondary | ICD-10-CM | POA: Diagnosis not present

## 2020-10-13 DIAGNOSIS — M79675 Pain in left toe(s): Secondary | ICD-10-CM | POA: Diagnosis not present

## 2020-10-13 MED ORDER — CYANOCOBALAMIN 1000 MCG/ML IJ SOLN
1000.0000 ug | Freq: Once | INTRAMUSCULAR | Status: AC
  Administered 2020-10-13: 1000 ug via INTRAMUSCULAR

## 2020-10-13 NOTE — Progress Notes (Signed)
Patient presented for B 12 injection to right upper outer quadrant patient voiced no concerns nor showed any signs of distress during injection.

## 2020-11-14 ENCOUNTER — Other Ambulatory Visit: Payer: Self-pay

## 2020-11-14 ENCOUNTER — Ambulatory Visit (INDEPENDENT_AMBULATORY_CARE_PROVIDER_SITE_OTHER): Payer: Medicare HMO

## 2020-11-14 DIAGNOSIS — E538 Deficiency of other specified B group vitamins: Secondary | ICD-10-CM | POA: Diagnosis not present

## 2020-11-14 MED ORDER — CYANOCOBALAMIN 1000 MCG/ML IJ SOLN
1000.0000 ug | Freq: Once | INTRAMUSCULAR | Status: AC
  Administered 2020-11-14: 1000 ug via INTRAMUSCULAR

## 2020-11-14 NOTE — Progress Notes (Signed)
Patient presented for B 12 injection to left ventrogluteal, patient voiced no concerns nor showed any signs of distress during injection

## 2020-12-15 ENCOUNTER — Ambulatory Visit (INDEPENDENT_AMBULATORY_CARE_PROVIDER_SITE_OTHER): Payer: Medicare HMO

## 2020-12-15 ENCOUNTER — Ambulatory Visit: Payer: Medicare HMO

## 2020-12-15 ENCOUNTER — Other Ambulatory Visit: Payer: Self-pay

## 2020-12-15 DIAGNOSIS — E538 Deficiency of other specified B group vitamins: Secondary | ICD-10-CM | POA: Diagnosis not present

## 2020-12-15 MED ORDER — CYANOCOBALAMIN 1000 MCG/ML IJ SOLN
1000.0000 ug | Freq: Once | INTRAMUSCULAR | Status: AC
  Administered 2020-12-15: 1000 ug via INTRAMUSCULAR

## 2020-12-15 NOTE — Progress Notes (Addendum)
Patient presented for B 12 injection to left ventrogluteal, Pt declined a injection in either arms and states that they hurt. Patient voiced no concerns nor showed any signs of distress during injection.

## 2021-01-16 ENCOUNTER — Other Ambulatory Visit: Payer: Self-pay

## 2021-01-16 ENCOUNTER — Ambulatory Visit (INDEPENDENT_AMBULATORY_CARE_PROVIDER_SITE_OTHER): Payer: Medicare HMO

## 2021-01-16 DIAGNOSIS — E538 Deficiency of other specified B group vitamins: Secondary | ICD-10-CM

## 2021-01-16 MED ORDER — CYANOCOBALAMIN 1000 MCG/ML IJ SOLN
1000.0000 ug | Freq: Once | INTRAMUSCULAR | Status: AC
  Administered 2021-01-16: 1000 ug via INTRAMUSCULAR

## 2021-01-16 NOTE — Progress Notes (Addendum)
Patient presented for B 12 injection to left hip, patient voiced no concerns nor showed any signs of distress during injection

## 2021-02-16 ENCOUNTER — Ambulatory Visit: Payer: Medicare HMO

## 2021-02-21 ENCOUNTER — Other Ambulatory Visit: Payer: Self-pay

## 2021-02-21 ENCOUNTER — Ambulatory Visit (INDEPENDENT_AMBULATORY_CARE_PROVIDER_SITE_OTHER): Payer: Medicare HMO

## 2021-02-21 DIAGNOSIS — E538 Deficiency of other specified B group vitamins: Secondary | ICD-10-CM

## 2021-02-21 MED ORDER — CYANOCOBALAMIN 1000 MCG/ML IJ SOLN
1000.0000 ug | Freq: Once | INTRAMUSCULAR | Status: AC
  Administered 2021-02-21: 1000 ug via INTRAMUSCULAR

## 2021-02-21 NOTE — Progress Notes (Signed)
Patient presented for B 12 injection to left glute, patient voiced no concerns nor showed any signs of distress during injection.

## 2021-03-23 ENCOUNTER — Ambulatory Visit (INDEPENDENT_AMBULATORY_CARE_PROVIDER_SITE_OTHER): Payer: Medicare HMO

## 2021-03-23 ENCOUNTER — Other Ambulatory Visit: Payer: Self-pay

## 2021-03-23 DIAGNOSIS — E538 Deficiency of other specified B group vitamins: Secondary | ICD-10-CM

## 2021-03-23 MED ORDER — CYANOCOBALAMIN 1000 MCG/ML IJ SOLN
1000.0000 ug | Freq: Once | INTRAMUSCULAR | Status: AC
  Administered 2021-03-23: 1000 ug via INTRAMUSCULAR

## 2021-03-23 NOTE — Progress Notes (Signed)
Patient presented for B 12 injection to left deltoid, patient voiced no concerns nor showed any signs of distress during injection. 

## 2021-04-26 ENCOUNTER — Other Ambulatory Visit: Payer: Self-pay

## 2021-04-26 ENCOUNTER — Ambulatory Visit (INDEPENDENT_AMBULATORY_CARE_PROVIDER_SITE_OTHER): Payer: Medicare HMO

## 2021-04-26 DIAGNOSIS — E538 Deficiency of other specified B group vitamins: Secondary | ICD-10-CM

## 2021-04-26 MED ORDER — CYANOCOBALAMIN 1000 MCG/ML IJ SOLN
1000.0000 ug | Freq: Once | INTRAMUSCULAR | Status: AC
  Administered 2021-04-26: 1000 ug via INTRAMUSCULAR

## 2021-04-26 NOTE — Progress Notes (Signed)
Patient presented for B 12 injection to left glute, patient voiced no concerns nor showed any signs of distress during injection.

## 2021-05-26 ENCOUNTER — Ambulatory Visit (INDEPENDENT_AMBULATORY_CARE_PROVIDER_SITE_OTHER): Payer: Medicare HMO

## 2021-05-26 ENCOUNTER — Other Ambulatory Visit: Payer: Self-pay

## 2021-05-26 DIAGNOSIS — E538 Deficiency of other specified B group vitamins: Secondary | ICD-10-CM

## 2021-05-26 MED ORDER — CYANOCOBALAMIN 1000 MCG/ML IJ SOLN
1000.0000 ug | Freq: Once | INTRAMUSCULAR | Status: AC
  Administered 2021-05-26: 10:00:00 1000 ug via INTRAMUSCULAR

## 2021-05-26 NOTE — Progress Notes (Signed)
Kristin Valdez presents today for injection per MD orders. B12 injection  administered IM in left Gluteal. Administration without incident. Patient tolerated well.  Ericson Nafziger,cma

## 2021-06-28 ENCOUNTER — Ambulatory Visit (INDEPENDENT_AMBULATORY_CARE_PROVIDER_SITE_OTHER): Payer: Medicare HMO

## 2021-06-28 ENCOUNTER — Other Ambulatory Visit: Payer: Self-pay

## 2021-06-28 DIAGNOSIS — E538 Deficiency of other specified B group vitamins: Secondary | ICD-10-CM | POA: Diagnosis not present

## 2021-06-28 MED ORDER — CYANOCOBALAMIN 1000 MCG/ML IJ SOLN
1000.0000 ug | Freq: Once | INTRAMUSCULAR | Status: AC
  Administered 2021-06-28: 1000 ug via INTRAMUSCULAR

## 2021-06-28 NOTE — Progress Notes (Signed)
Kristin Valdez presents today for injection per MD orders. B12 injection  administered IM in left Gluteal. Administration without incident. Patient tolerated well.   [name],cma

## 2021-06-30 ENCOUNTER — Ambulatory Visit (INDEPENDENT_AMBULATORY_CARE_PROVIDER_SITE_OTHER): Payer: Medicare HMO

## 2021-06-30 VITALS — Ht 64.0 in | Wt 173.0 lb

## 2021-06-30 DIAGNOSIS — Z0000 Encounter for general adult medical examination without abnormal findings: Secondary | ICD-10-CM | POA: Diagnosis not present

## 2021-06-30 NOTE — Patient Instructions (Addendum)
Kristin Valdez , Thank you for taking time to come for your Medicare Wellness Visit. I appreciate your ongoing commitment to your health goals. Please review the following plan we discussed and let me know if I can assist you in the future.   These are the goals we discussed:  Goals      Maintain Healthy Lifestyle     Stay active Exercise         This is a list of the screening recommended for you and due dates:  Health Maintenance  Topic Date Due   Tetanus Vaccine  10/30/2023   DEXA scan (bone density measurement)  Completed   HPV Vaccine  Aged Out   Pneumonia Vaccine  Discontinued   Flu Shot  Discontinued   COVID-19 Vaccine  Discontinued   Zoster (Shingles) Vaccine  Discontinued    Advanced directives: End of life planning; Advance aging; Advanced directives discussed.  Copy of current HCPOA/Living Will requested.    Conditions/risks identified: none new  Follow up in one year for your annual wellness visit    Preventive Care 65 Years and Older, Female Preventive care refers to lifestyle choices and visits with your health care provider that can promote health and wellness. What does preventive care include? A yearly physical exam. This is also called an annual well check. Dental exams once or twice a year. Routine eye exams. Ask your health care provider how often you should have your eyes checked. Personal lifestyle choices, including: Daily care of your teeth and gums. Regular physical activity. Eating a healthy diet. Avoiding tobacco and drug use. Limiting alcohol use. Practicing safe sex. Taking low-dose aspirin every day. Taking vitamin and mineral supplements as recommended by your health care provider. What happens during an annual well check? The services and screenings done by your health care provider during your annual well check will depend on your age, overall health, lifestyle risk factors, and family history of disease. Counseling  Your health care  provider may ask you questions about your: Alcohol use. Tobacco use. Drug use. Emotional well-being. Home and relationship well-being. Sexual activity. Eating habits. History of falls. Memory and ability to understand (cognition). Work and work Statistician. Reproductive health. Screening  You may have the following tests or measurements: Height, weight, and BMI. Blood pressure. Lipid and cholesterol levels. These may be checked every 5 years, or more frequently if you are over 62 years old. Skin check. Lung cancer screening. You may have this screening every year starting at age 40 if you have a 30-pack-year history of smoking and currently smoke or have quit within the past 15 years. Fecal occult blood test (FOBT) of the stool. You may have this test every year starting at age 73. Flexible sigmoidoscopy or colonoscopy. You may have a sigmoidoscopy every 5 years or a colonoscopy every 10 years starting at age 24. Hepatitis C blood test. Hepatitis B blood test. Sexually transmitted disease (STD) testing. Diabetes screening. This is done by checking your blood sugar (glucose) after you have not eaten for a while (fasting). You may have this done every 1-3 years. Bone density scan. This is done to screen for osteoporosis. You may have this done starting at age 68. Mammogram. This may be done every 1-2 years. Talk to your health care provider about how often you should have regular mammograms. Talk with your health care provider about your test results, treatment options, and if necessary, the need for more tests. Vaccines  Your health care provider may  recommend certain vaccines, such as: Influenza vaccine. This is recommended every year. Tetanus, diphtheria, and acellular pertussis (Tdap, Td) vaccine. You may need a Td booster every 10 years. Zoster vaccine. You may need this after age 79. Pneumococcal 13-valent conjugate (PCV13) vaccine. One dose is recommended after age  34. Pneumococcal polysaccharide (PPSV23) vaccine. One dose is recommended after age 44. Talk to your health care provider about which screenings and vaccines you need and how often you need them. This information is not intended to replace advice given to you by your health care provider. Make sure you discuss any questions you have with your health care provider. Document Released: 06/10/2015 Document Revised: 02/01/2016 Document Reviewed: 03/15/2015 Elsevier Interactive Patient Education  2017 [place] Prevention in the Home Falls can cause injuries. They can happen to people of all ages. There are many things you can do to make your home safe and to help prevent falls. What can I do on the outside of my home? Regularly fix the edges of walkways and driveways and fix any cracks. Remove anything that might make you trip as you walk through a door, such as a raised step or threshold. Trim any bushes or trees on the path to your home. Use bright outdoor lighting. Clear any walking paths of anything that might make someone trip, such as rocks or tools. Regularly check to see if handrails are loose or broken. Make sure that both sides of any steps have handrails. Any raised decks and porches should have guardrails on the edges. Have any leaves, snow, or ice cleared regularly. Use sand or salt on walking paths during winter. Clean up any spills in your garage right away. This includes oil or grease spills. What can I do in the bathroom? Use night lights. Install grab bars by the toilet and in the tub and shower. Do not use towel bars as grab bars. Use non-skid mats or decals in the tub or shower. If you need to sit down in the shower, use a plastic, non-slip stool. Keep the floor dry. Clean up any water that spills on the floor as soon as it happens. Remove soap buildup in the tub or shower regularly. Attach bath mats securely with double-sided non-slip rug tape. Do not have throw  rugs and other things on the floor that can make you trip. What can I do in the bedroom? Use night lights. Make sure that you have a light by your bed that is easy to reach. Do not use any sheets or blankets that are too big for your bed. They should not hang down onto the floor. Have a firm chair that has side arms. You can use this for support while you get dressed. Do not have throw rugs and other things on the floor that can make you trip. What can I do in the kitchen? Clean up any spills right away. Avoid walking on wet floors. Keep items that you use a lot in easy-to-reach places. If you need to reach something above you, use a strong step stool that has a grab bar. Keep electrical cords out of the way. Do not use floor polish or wax that makes floors slippery. If you must use wax, use non-skid floor wax. Do not have throw rugs and other things on the floor that can make you trip. What can I do with my stairs? Do not leave any items on the stairs. Make sure that there are handrails on both sides of  the stairs and use them. Fix handrails that are broken or loose. Make sure that handrails are as long as the stairways. Check any carpeting to make sure that it is firmly attached to the stairs. Fix any carpet that is loose or worn. Avoid having throw rugs at the top or bottom of the stairs. If you do have throw rugs, attach them to the floor with carpet tape. Make sure that you have a light switch at the top of the stairs and the bottom of the stairs. If you do not have them, ask someone to add them for you. What else can I do to help prevent falls? Wear shoes that: Do not have high heels. Have rubber bottoms. Are comfortable and fit you well. Are closed at the toe. Do not wear sandals. If you use a stepladder: Make sure that it is fully opened. Do not climb a closed stepladder. Make sure that both sides of the stepladder are locked into place. Ask someone to hold it for you, if  possible. Clearly mark and make sure that you can see: Any grab bars or handrails. First and last steps. Where the edge of each step is. Use tools that help you move around (mobility aids) if they are needed. These include: Canes. Walkers. Scooters. Crutches. Turn on the lights when you go into a dark area. Replace any light bulbs as soon as they burn out. Set up your furniture so you have a clear path. Avoid moving your furniture around. If any of your floors are uneven, fix them. If there are any pets around you, be aware of where they are. Review your medicines with your doctor. Some medicines can make you feel dizzy. This can increase your chance of falling. Ask your doctor what other things that you can do to help prevent falls. This information is not intended to replace advice given to you by your health care provider. Make sure you discuss any questions you have with your health care provider. Document Released: 03/10/2009 Document Revised: 10/20/2015 Document Reviewed: 06/18/2014 Elsevier Interactive Patient Education  2017 [employer].

## 2021-06-30 NOTE — Progress Notes (Signed)
Subjective:   Kristin Valdez is a 86 y.o. female who presents for Medicare Annual (Subsequent) preventive examination.  Review of Systems    No ROS.  Medicare Wellness Virtual Visit.  Visual/audio telehealth visit, UTA vital signs.   See social history for additional risk factors.   Cardiac Risk Factors include: advanced age (>20men, >33 women)     Objective:    Today's Vitals   06/30/21 1406  Weight: 173 lb (78.5 kg)  Height: 5\' 4"  (1.626 m)   Body mass index is 29.7 kg/m.  Advanced Directives 06/30/2021 06/29/2020 06/29/2019 06/25/2018 06/24/2017 11/29/2016 08/13/2016  Does Patient Have a Medical Advance Directive? Yes Yes Yes Yes Yes No Yes  Type of Paramedic of [place];Living will [place];Living will Healthcare Power of [place];Living will [place];Living will - [place];Living will  Does patient want to make changes to medical advance directive? No - Patient declined No - Patient declined No - Patient declined No - Patient declined No - Patient declined - -  Copy of [place] in Chart? No - copy requested No - copy requested No - copy requested No - copy requested No - copy requested - No - copy requested   Current Medications (verified) Outpatient Encounter Medications as of 06/30/2021  Medication Sig   Cholecalciferol (VITAMIN D3) 50 MCG (2000 UT) CAPS Take 4,000 Units by mouth daily in the afternoon.   No facility-administered encounter medications on file as of 06/30/2021.   Allergies (verified) Excedrin extra strength [asa-apap-caff buffered]   History: Past Medical History:  Diagnosis Date   Adenocarcinoma of breast ([place]) 11/08/2016   Overview:  No chemo/XRT 2/2 localized lesion.  Unable to tolerate Tamoxifen.   Breast cancer ([place]) 2014-2015   left breast cancer   Chickenpox    Primary cancer of upper outer quadrant of right female breast  ([place]) 05/06/2016   Past Surgical History:  Procedure Laterality Date   ABDOMINAL HYSTERECTOMY     APPENDECTOMY     BREAST BIOPSY Left    Stereotactic biopsy   BREAST BIOPSY Right 05/02/2016   US guided biopsy   MASTECTOMY Left 2015   MASTECTOMY W/ SENTINEL NODE BIOPSY Right 05/24/2016   Procedure: MASTECTOMY WITH SENTINEL LYMPH NODE BIOPSY;  Surgeon: Hubbard Robinson, MD;  Location: [facility] ORS;  Service: General;  Laterality: Right;   Family History  Problem Relation Age of Onset   Heart disease Mother    Prostate cancer Father    Stroke Brother    Healthy Daughter    Social History   Socioeconomic History   Marital status: Widowed    Spouse name: Not on file   Number of children: Not on file   Years of education: Not on file   Highest education level: Not on file  Occupational History   Not on file  Tobacco Use   Smoking status: Former    Types: Cigarettes    Quit date: 01/09/1971    Years since quitting: 50.5   Smokeless tobacco: Never  Vaping Use   Vaping Use: Never used  Substance and Sexual Activity   Alcohol use: Yes    Comment: Occasional   Drug use: No   Sexual activity: Never  Other Topics Concern   Not on file  Social History Narrative   Works 2-3 days a week at [employer].   Social Determinants of Health   Financial Resource Strain: Low  Risk    Difficulty of Paying Living Expenses: Not hard at all  Food Insecurity: No Food Insecurity   Worried About [address] in the Last Year: Never true   Ran Out of Food in the Last Year: Never true  Transportation Needs: No Transportation Needs   Lack of Transportation (Medical): No   Lack of Transportation (Non-Medical): No  Physical Activity: Not on file  Stress: No Stress Concern Present   Feeling of Stress : Not at all  Social Connections: Unknown   Frequency of Communication with Friends and Family: More than three times a week   Frequency of Social Gatherings with Friends and Family: More  than three times a week   Attends Religious Services: Not on Electrical engineer or Organizations: Not on file   Attends Archivist Meetings: Not on file   Marital Status: Not on file    Tobacco Counseling Counseling given: Not Answered   Clinical Intake:  Pre-visit preparation completed: Yes        Diabetes: No  How often do you need to have someone help you when you read instructions, pamphlets, or other written materials from your doctor or pharmacy?: 1 - Never  Interpreter Needed?: No      Activities of Daily Living In your present state of health, do you have any difficulty performing the following activities: 06/30/2021  Hearing? Y  Comment L hearing aid worn  Vision? N  Difficulty concentrating or making decisions? N  Walking or climbing stairs? N  Comment paces self  Dressing or bathing? N  Doing errands, shopping? N  Preparing Food and eating ? N  Using the Toilet? N  In the past six months, have you accidently leaked urine? N  Do you have problems with loss of bowel control? N  Managing your Medications? N  Managing your Finances? N  Housekeeping or managing your Housekeeping? N  Some recent data might be hidden    Patient Care Team: Leone Haven, MD as PCP - General (Family Medicine)  Indicate any recent Medical Services you may have received from other than [facility] providers in the past year (date may be approximate).     Assessment:   This is a routine wellness examination for Kristin Valdez.  Virtual Visit via Telephone Note  I connected with  DOLLY HARBACH on 06/30/21 at  2:00 PM EST by telephone and verified that I am speaking with the correct person using two identifiers.  Persons participating in the virtual visit: patient/Nurse Health Advisor   I discussed the limitations, risks, security and privacy concerns of performing an evaluation and management service by telephone and the availability of in person appointments. The  patient expressed understanding and agreed to proceed.  Interactive audio and video telecommunications were attempted between this nurse and patient, however failed, due to patient having technical difficulties OR patient did not have access to video capability.  We continued and completed visit with audio only.  Some vital signs may be absent or patient reported.   Hearing/Vision screen Hearing Screening - Comments:: Hearing aid, L ear only Vision Screening - Comments:: Followed by Dr. Ellin Mayhew  Wears clear contact lenses R eye to prevent lashes from growing inward.  Dietary issues and exercise activities discussed: Current Exercise Habits: Home exercise routine, Intensity: Mild Healthy diet  Good water intake   Goals Addressed             This Visit's Progress  Maintain Healthy Lifestyle       Stay active Exercise        Depression Screen PHQ 2/9 Scores 06/30/2021 06/29/2020 12/28/2019 06/29/2019 04/03/2019 06/25/2018 06/24/2017  PHQ - 2 Score 0 0 0 0 0 0 0  Exception Documentation - - - - - - -    Fall Risk Fall Risk  06/30/2021 06/29/2020 12/28/2019 12/28/2019 06/29/2019  Falls in the past year? 0 0 0 0 0  Number falls in past yr: 0 0 0 0 -  Injury with Fall? - - - - -  Follow up Falls evaluation completed Falls evaluation completed Falls evaluation completed - Falls prevention discussed    FALL RISK PREVENTION PERTAINING TO THE HOME: Home free of loose throw rugs in walkways, pet beds, electrical cords, etc? Yes  Adequate lighting in your home to reduce risk of falls? Yes   ASSISTIVE DEVICES UTILIZED TO PREVENT FALLS: Life alert? No  Use of a cane, walker or w/c? No  Grab bars in the bathroom? No  Shower chair or bench in shower? No  Elevated toilet seat or a handicapped toilet? No   TIMED UP AND GO: Was the test performed? No .   Cognitive Function:  Patient is alert and oriented x3.    6CIT Screen 06/30/2021 06/29/2019 06/25/2018 06/24/2017  What Year? 0 points 0 points 0  points 0 points  What month? 0 points 0 points 0 points 0 points  What time? 0 points 0 points 0 points 0 points  Count back from 20 0 points 0 points 0 points 0 points  Months in reverse 0 points 0 points 0 points 0 points  Repeat phrase 0 points 0 points 0 points 0 points  Total Score 0 0 0 0    Immunizations Immunization History  Administered Date(s) Administered   Tdap 10/29/2013   Flu Vaccine status: Declined, Education has been provided regarding the importance of this vaccine but patient still declined. Advised may receive this vaccine at local pharmacy or Health Dept. Aware to provide a copy of the vaccination record if obtained from local pharmacy or Health Dept. Verbalized acceptance and understanding. Discontinued per patient.   Pneumococcal vaccine status: Declined,  Education has been provided regarding the importance of this vaccine but patient still declined. Advised may receive this vaccine at local pharmacy or Health Dept. Aware to provide a copy of the vaccination record if obtained from local pharmacy or Health Dept. Verbalized acceptance and understanding.  Discontinued per patient.   Covid-19 vaccine status: Declined, Education has been provided regarding the importance of this vaccine but patient still declined. Advised may receive this vaccine at local pharmacy or Health Dept.or vaccine clinic. Aware to provide a copy of the vaccination record if obtained from local pharmacy or Health Dept. Verbalized acceptance and understanding. Discontinued per patient.   Shingrix vaccine- Discontinued per patient.   Screening Tests Health Maintenance  Topic Date Due   TETANUS/TDAP  10/30/2023   DEXA SCAN  Completed   HPV VACCINES  Aged Out   Pneumonia Vaccine 32+ Years old  Discontinued   INFLUENZA VACCINE  Discontinued   COVID-19 Vaccine  Discontinued   Zoster Vaccines- Shingrix  Discontinued   Health Maintenance There are no preventive care reminders to display for this  patient.  Lung Cancer Screening: (Low Dose CT Chest recommended if Age 55-80 years, 30 pack-year currently smoking OR have quit w/in 15years.) does not qualify.   Hepatitis C Screening: does not qualify  Vision Screening:  Recommended annual ophthalmology exams for early detection of glaucoma and other disorders of the eye.  Dental Screening: Recommended annual dental exams for proper oral hygiene. Partials upper/lower.   Community Resource Referral / Chronic Care Management: CRR required this visit?  No   CCM required this visit?  No      Plan:   Keep all routine maintenance appointments.   I have personally reviewed and noted the following in the patients chart:   Medical and social history Use of alcohol, tobacco or illicit drugs  Current medications and supplements including opioid prescriptions.  Functional ability and status Nutritional status Physical activity Advanced directives List of other physicians Hospitalizations, surgeries, and ER visits in previous 12 months Vitals Screenings to include cognitive, depression, and falls Referrals and appointments  In addition, I have reviewed and discussed with patient certain preventive protocols, quality metrics, and best practice recommendations. A written personalized care plan for preventive services as well as general preventive health recommendations were provided to patient.     Varney Biles, LPN   08/29/9673

## 2021-07-03 ENCOUNTER — Encounter: Payer: Self-pay | Admitting: Family Medicine

## 2021-07-03 ENCOUNTER — Other Ambulatory Visit: Payer: Self-pay

## 2021-07-03 ENCOUNTER — Ambulatory Visit (INDEPENDENT_AMBULATORY_CARE_PROVIDER_SITE_OTHER): Payer: Medicare HMO | Admitting: Family Medicine

## 2021-07-03 VITALS — BP 140/80 | HR 89 | Temp 98.0°F | Ht 64.0 in | Wt 170.6 lb

## 2021-07-03 DIAGNOSIS — R21 Rash and other nonspecific skin eruption: Secondary | ICD-10-CM | POA: Diagnosis not present

## 2021-07-03 DIAGNOSIS — R7303 Prediabetes: Secondary | ICD-10-CM

## 2021-07-03 DIAGNOSIS — E559 Vitamin D deficiency, unspecified: Secondary | ICD-10-CM

## 2021-07-03 DIAGNOSIS — M25562 Pain in left knee: Secondary | ICD-10-CM

## 2021-07-03 DIAGNOSIS — E785 Hyperlipidemia, unspecified: Secondary | ICD-10-CM

## 2021-07-03 DIAGNOSIS — H9319 Tinnitus, unspecified ear: Secondary | ICD-10-CM | POA: Insufficient documentation

## 2021-07-03 DIAGNOSIS — H9313 Tinnitus, bilateral: Secondary | ICD-10-CM

## 2021-07-03 DIAGNOSIS — Z0001 Encounter for general adult medical examination with abnormal findings: Secondary | ICD-10-CM | POA: Diagnosis not present

## 2021-07-03 DIAGNOSIS — L989 Disorder of the skin and subcutaneous tissue, unspecified: Secondary | ICD-10-CM | POA: Insufficient documentation

## 2021-07-03 DIAGNOSIS — G8929 Other chronic pain: Secondary | ICD-10-CM

## 2021-07-03 LAB — LIPID PANEL
Cholesterol: 234 mg/dL — ABNORMAL HIGH (ref 0–200)
HDL: 58.2 mg/dL (ref >39.00)
LDL Cholesterol: 140 mg/dL — ABNORMAL HIGH (ref 0–99)
NonHDL: 176.23
Total CHOL/HDL Ratio: 4
Triglycerides: 182 mg/dL — ABNORMAL HIGH (ref 0.0–149.0)
VLDL: 36.4 mg/dL (ref 0.0–40.0)

## 2021-07-03 LAB — COMPREHENSIVE METABOLIC PANEL
ALT: 25 U/L (ref 0–35)
AST: 17 U/L (ref 0–37)
Albumin: 4.2 g/dL (ref 3.5–5.2)
Alkaline Phosphatase: 88 U/L (ref 39–117)
BUN: 17 mg/dL (ref 6–23)
CO2: 29 mEq/L (ref 19–32)
Calcium: 9.6 mg/dL (ref 8.4–10.5)
Chloride: 103 mEq/L (ref 96–112)
Creatinine, Ser: 0.83 mg/dL (ref 0.40–1.20)
GFR: 65.1 mL/min (ref >60.00)
Glucose, Bld: 102 mg/dL — ABNORMAL HIGH (ref 70–99)
Potassium: 4.9 mEq/L (ref 3.5–5.1)
Sodium: 137 mEq/L (ref 135–145)
Total Bilirubin: 0.5 mg/dL (ref 0.2–1.2)
Total Protein: 7.1 g/dL (ref 6.0–8.3)

## 2021-07-03 LAB — VITAMIN D 25 HYDROXY (VIT D DEFICIENCY, FRACTURES): VITD: 32.33 ng/mL (ref 30.00–100.00)

## 2021-07-03 LAB — HEMOGLOBIN A1C: Hgb A1c MFr Bld: 6.4 % (ref 4.6–6.5)

## 2021-07-03 NOTE — Assessment & Plan Note (Signed)
Ongoing issue.  Likely a meniscal issue though could represent an arthritic issue.  Discussed given her improvement we could continue to monitor.  She can use a topical anti-inflammatory.  If it worsens we would refer to orthopedics.  She will complete exercises at home.

## 2021-07-03 NOTE — Assessment & Plan Note (Signed)
No evidence of rash on exam today.  Discussed this could be an atopic issue or could be related to venous insufficiency though she denies any lower extremity swelling.  She will let me know what cream she is using at home and I can refill this.  If this worsens or does not become responsive to the cream we could refer to dermatology.

## 2021-07-03 NOTE — Patient Instructions (Signed)
Nice to see you. Please do the following exercises for your knee. We will contact you with your lab results. Please let me know what cream you are using on your leg.  Knee Exercises Ask your health care provider which exercises are safe for you. Do exercises exactly as told by your health care provider and adjust them as directed. It is normal to feel mild stretching, pulling, tightness, or discomfort as you do these exercises. Stop right away if you feel sudden pain or your pain gets worse. Do not begin these exercises until told by your health care provider. Stretching and range-of-motion exercises These exercises warm up your muscles and joints and improve the movement and flexibility of your knee. These exercises also help to relieve pain and swelling. Knee extension, prone  Lie on your abdomen (prone position) on a bed. Place your left / right knee just beyond the edge of the surface so your knee is not on the bed. You can put a towel under your left / right thigh just above your kneecap for comfort. Relax your leg muscles and allow gravity to straighten your knee (extension). You should feel a stretch behind your left / right knee. Hold this position for __________ seconds. Scoot up so your knee is supported between repetitions. Repeat __________ times. Complete this exercise __________ times a day. Knee flexion, active  Lie on your back with both legs straight. If this causes back discomfort, bend your left / right knee so your foot is flat on the floor. Slowly slide your left / right heel back toward your buttocks. Stop when you feel a gentle stretch in the front of your knee or thigh (flexion). Hold this position for __________ seconds. Slowly slide your left / right heel back to the starting position. Repeat __________ times. Complete this exercise __________ times a day. Quadriceps stretch, prone  Lie on your abdomen on a firm surface, such as a bed or padded floor. Bend your  left / right knee and hold your ankle. If you cannot reach your ankle or pant leg, loop a belt around your foot and grab the belt instead. Gently pull your heel toward your buttocks. Your knee should not slide out to the side. You should feel a stretch in the front of your thigh and knee (quadriceps). Hold this position for __________ seconds. Repeat __________ times. Complete this exercise __________ times a day. Hamstring, supine  Lie on your back (supine position). Loop a belt or towel over the ball of your left / right foot. The ball of your foot is on the walking surface, right under your toes. Straighten your left / right knee and slowly pull on the belt to raise your leg until you feel a gentle stretch behind your knee (hamstring). Do not let your knee bend while you do this. Keep your other leg flat on the floor. Hold this position for __________ seconds. Repeat __________ times. Complete this exercise __________ times a day. Strengthening exercises These exercises build strength and endurance in your knee. Endurance is the ability to use your muscles for a long time, even after they get tired. Quadriceps, isometric This exercise strengthens the muscles in front of your thigh (quadriceps) without moving your knee joint (isometric). Lie on your back with your left / right leg extended and your other knee bent. Put a rolled towel or small pillow under your knee if told by your health care provider. Slowly tense the muscles in the front of your left /  right thigh. You should see your kneecap slide up toward your hip or see increased dimpling just above the knee. This motion will push the back of the knee toward the floor. For __________ seconds, hold the muscle as tight as you can without increasing your pain. Relax the muscles slowly and completely. Repeat __________ times. Complete this exercise __________ times a day. Straight leg raises This exercise strengthens the muscles in front  of your thigh (quadriceps) and the muscles that move your hips (hip flexors). Lie on your back with your left / right leg extended and your other knee bent. Tense the muscles in the front of your left / right thigh. You should see your kneecap slide up or see increased dimpling just above the knee. Your thigh may even shake a bit. Keep these muscles tight as you raise your leg 4-6 inches (10-15 cm) off the floor. Do not let your knee bend. Hold this position for __________ seconds. Keep these muscles tense as you lower your leg. Relax your muscles slowly and completely after each repetition. Repeat __________ times. Complete this exercise __________ times a day. Hamstring, isometric  Lie on your back on a firm surface. Bend your left / right knee about __________ degrees. Dig your left / right heel into the surface as if you are trying to pull it toward your buttocks. Tighten the muscles in the back of your thighs (hamstring) to "dig" as hard as you can without increasing any pain. Hold this position for __________ seconds. Release the tension gradually and allow your muscles to relax completely for __________ seconds after each repetition. Repeat __________ times. Complete this exercise __________ times a day. Hamstring curls If told by your health care provider, do this exercise while wearing ankle weights. Begin with __________lb / kg weights. Then increase the weight by 1 lb (0.5 kg) increments. Do not wear ankle weights that are more than __________lb / kg. Lie on your abdomen with your legs straight. Bend your left / right knee as far as you can without feeling pain. Keep your hips flat against the floor. Hold this position for __________ seconds. Slowly lower your leg to the starting position. Repeat __________ times. Complete this exercise __________ times a day. Squats This exercise strengthens the muscles in front of your thigh and knee (quadriceps). Stand in front of a table, with  your feet and knees pointing straight ahead. You may rest your hands on the table for balance but not for support. Slowly bend your knees and lower your hips like you are going to sit in a chair. Keep your weight over your heels, not over your toes. Keep your lower legs upright so they are parallel with the table legs. Do not let your hips go lower than your knees. Do not bend lower than told by your health care provider. If your knee pain increases, do not bend as low. Hold the squat position for __________ seconds. Slowly push with your legs to return to standing. Do not use your hands to pull yourself to standing. Repeat __________ times. Complete this exercise __________ times a day. Wall slides This exercise strengthens the muscles in front of your thigh and knee (quadriceps). Lean your back against a smooth wall or door, and walk your feet out 18-24 inches (46-61 cm) from it. Place your feet hip-width apart. Slowly slide down the wall or door until your knees bend __________ degrees. Keep your knees over your heels, not over your toes. Keep your knees  in line with your hips. Hold this position for __________ seconds. Repeat __________ times. Complete this exercise __________ times a day. Straight leg raises, side-lying This exercise strengthens the muscles that rotate the leg at the hip and move it away from your body (hip abductors). Lie on your side with your left / right leg in the top position. Lie so your head, shoulder, knee, and hip line up. You may bend your bottom knee to help you keep your balance. Roll your hips slightly forward so your hips are stacked directly over each other and your left / right knee is facing forward. Leading with your heel, lift your top leg 4-6 inches (10-15 cm). You should feel the muscles in your outer hip lifting. Do not let your foot drift forward. Do not let your knee roll toward the ceiling. Hold this position for __________ seconds. Slowly  return your leg to the starting position. Let your muscles relax completely after each repetition. Repeat __________ times. Complete this exercise __________ times a day. Straight leg raises, prone This exercise stretches the muscles that move your hips away from the front of the pelvis (hip extensors). Lie on your abdomen on a firm surface. You can put a pillow under your hips if that is more comfortable. Tense the muscles in your buttocks and lift your left / right leg about 4-6 inches (10-15 cm). Keep your knee straight as you lift your leg. Hold this position for __________ seconds. Slowly lower your leg to the starting position. Let your leg relax completely after each repetition. Repeat __________ times. Complete this exercise __________ times a day. This information is not intended to replace advice given to you by your health care provider. Make sure you discuss any questions you have with your health care provider. Document Revised: 01/24/2021 Document Reviewed: 01/24/2021 Elsevier Patient Education  [place].

## 2021-07-03 NOTE — Assessment & Plan Note (Signed)
Chronic issue.  Unchanged.  Stable.  Discussed monitoring for development of any other symptoms.  Advised there were no interventions for tinnitus.

## 2021-07-03 NOTE — Progress Notes (Signed)
Tommi Rumps, MD Phone: [phone]  Kristin Valdez is a 86 y.o. female who presents today for CPE.  Diet: generally healthy, no soda or sweet tea, plenty of fruits and vegetables Exercise: stays active Pap smear: aged out Colonoscopy: aged out Mammogram: s/p bilateral mastectomy Family history-  Colon cancer: no  Breast cancer: no  Ovarian cancer: no Menses: hysterectomy Vaccines-   Flu: declines  Tetanus: UTD  Shingles: declines  COVID19: declines  Pneumonia: declines Tobacco use: quit 40+ years ago Alcohol use: 1 glass wine per night Illicit Drug use: no Dentist: yes Ophthalmology: yes  The patient reports chronic tinnitus.  No hearing changes.  Left knee pain: This has been going on 6 or so months.  The pain is along the medial joint line.  She was doing some exercises in the shower and feels as though that may have contributed.  Notes the knee pain is worse when it is cold outside.  It does not give out on her.  She does have a clicking in the knee.  No locking up on her.  Topical anti-inflammatories help.  She has limited the exercises in the shower and the knee pain has been improving.  Left leg itching/rash: This occurs intermittently on her left lateral lower leg.  She uses a topical cream on it with good benefit.  This has been going on for a long time.   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[place]) 05/06/2016   Routine general medical examination at a health care facility 09/21/2016   Adenocarcinoma of breast ([place]) 11/08/2016   Pain of right thumb 11/29/2016   Past Medical History:  Diagnosis Date   Breast cancer ([place]) 2014-2015   Chickenpox     Family History  Problem Relation Age of Onset   Heart disease Mother    Prostate cancer Father    Stroke Brother    Healthy Daughter     Social History   Socioeconomic History   Marital status: Widowed    Spouse name: Not on file   Number of children: Not on file   Years of education: Not on file   Highest education level: Not on file  Occupational History   Not on file  Tobacco Use   Smoking status: Former    Types: Cigarettes    Quit date: 01/09/1971    Years since quitting: 50.5   Smokeless tobacco: Never  Vaping Use   Vaping Use: Never used  Substance and Sexual Activity   Alcohol use: Yes    Comment: Occasional   Drug use: No   Sexual activity: Never  Other Topics Concern   Not on file  Social History Narrative   Works 2-3 days a week at [employer].   Social Determinants of Health   Financial Resource Strain: Low Risk    Difficulty of Paying Living Expenses: Not hard at all  Food  Insecurity: No Food Insecurity   Worried About Charity fundraiser in the Last Year: Never true   Ran Out of Food in the Last Year: Never true  Transportation Needs: No Transportation Needs   Lack of Transportation (Medical): No   Lack of Transportation (Non-Medical): No  Physical Activity: Not on file  Stress: No Stress Concern Present   Feeling of Stress : Not at all  Social Connections: Unknown   Frequency of Communication with Friends and Family: More than three times a week   Frequency of Social Gatherings with Friends and Family: More than three times a week   Attends Religious Services: Not on Electrical engineer or Organizations: Not on file   Attends Theatre manager Meetings: Not on file   Marital Status: Not on file  Intimate Partner Violence: Not At Risk   Fear of Current or Ex-Partner: No   Emotionally Abused: No   Physically Abused: No   Sexually Abused: No    ROS  General:  Negative for nexplained weight loss, fever Skin: Negative for new or changing mole, sore that won't heal HEENT: Positive for tinnitus, negative for trouble hearing, trouble seeing, mouth sores, hoarseness, change in voice, dysphagia. CV:  Negative for chest pain, dyspnea, edema, palpitations Resp: Negative for cough, dyspnea, hemoptysis GI: Negative for nausea, vomiting, diarrhea, constipation, abdominal pain, melena, hematochezia. GU: Negative for dysuria, incontinence, urinary hesitance, hematuria, vaginal or penile discharge, polyuria, sexual difficulty, lumps in testicle or breasts MSK: Positive for joint pain, negative for muscle cramps or aches Neuro: Negative for headaches, weakness, numbness, dizziness, passing out/fainting Psych: Negative for depression, anxiety, memory problems  Objective  Physical Exam Vitals:   07/03/21 1007  BP: 140/80  Pulse: 89  Temp: 98 F (36.7 C)  SpO2: 97%    BP Readings from Last 3 Encounters:  07/03/21 140/80  06/29/20 120/80  12/28/19 117/80   Wt Readings from Last 3 Encounters:  07/03/21 170 lb 9.6 oz (77.4 kg)  06/30/21 173 lb (78.5 kg)  06/29/20 173 lb (78.5 kg)    Physical Exam Constitutional:      General: She is not in acute distress.    Appearance: She is not diaphoretic.  HENT:     Head: Normocephalic and atraumatic.  Eyes:     Conjunctiva/sclera: Conjunctivae normal.     Pupils: Pupils are equal, round, and reactive to light.  Cardiovascular:     Rate and Rhythm: Normal rate and regular rhythm.     Heart sounds: Normal heart sounds.  Pulmonary:     Effort: Pulmonary effort is normal.     Breath sounds: Normal breath sounds.  Abdominal:     General: Bowel sounds are normal. There is  no distension.     Palpations: Abdomen is soft.     Tenderness: There is no abdominal tenderness.  Genitourinary:    Comments: [name], CMA served as chaperone, bilateral chest with evidence of bilateral mastectomies, no palpable lesions over her chest, scars appear intact with no noted lesions Musculoskeletal:     Right lower leg: No edema.     Left lower leg: No edema.     Comments: Left knee with no swelling, warmth, erythema, or tenderness, negative McMurray's left knee  Lymphadenopathy:     Cervical: No cervical adenopathy.  Skin:    General: Skin is warm and dry.     Comments: No rash noted on her left lower leg  Neurological:  Mental Status: She is alert.  Psychiatric:        Mood and Affect: Mood normal.     Assessment/Plan:   Problem List Items Addressed This Visit     Encounter for general adult medical examination with abnormal findings - Primary    Physical exam completed.  Encourage continued healthy diet and good activity level.  Yearly chest exam completed given history of breast cancer.  Discussed flu vaccine, COVID-vaccine, Shingrix vaccine, and pneumonia vaccine.  Patient declines these.  She understands the purpose of them.  Advised not to increase her alcohol intake. Lab work as outlined.      HLD (hyperlipidemia)   Relevant Orders   Comp Met (CMET)   Lipid panel   Left knee pain    Ongoing issue.  Likely a meniscal issue though could represent an arthritic issue.  Discussed given her improvement we could continue to monitor.  She can use a topical anti-inflammatory.  If it worsens we would refer to orthopedics.  She will complete exercises at home.      Prediabetes   Relevant Orders   Comp Met (CMET)   HgB A1c   Rash    No evidence of rash on exam today.  Discussed this could be an atopic issue or could be related to venous insufficiency though she denies any lower extremity swelling.  She will let me know what cream she is using at home and I  can refill this.  If this worsens or does not become responsive to the cream we could refer to dermatology.      Tinnitus    Chronic issue.  Unchanged.  Stable.  Discussed monitoring for development of any other symptoms.  Advised there were no interventions for tinnitus.      Vitamin D deficiency   Relevant Orders   Vitamin D (25 hydroxy)    Return in about 1 year (around 07/03/2022) for CPE.  This visit occurred during the SARS-CoV-2 public health emergency.  Safety protocols were in place, including screening questions prior to the visit, additional usage of staff PPE, and extensive cleaning of exam room while observing appropriate contact time as indicated for disinfecting solutions.    Tommi Rumps, MD [place]

## 2021-07-03 NOTE — Assessment & Plan Note (Signed)
Physical exam completed.  Encourage continued healthy diet and good activity level.  Yearly chest exam completed given history of breast cancer.  Discussed flu vaccine, COVID-vaccine, Shingrix vaccine, and pneumonia vaccine.  Patient declines these.  She understands the purpose of them.  Advised not to increase her alcohol intake. Lab work as outlined.

## 2021-07-27 ENCOUNTER — Ambulatory Visit (INDEPENDENT_AMBULATORY_CARE_PROVIDER_SITE_OTHER): Payer: Medicare HMO | Admitting: *Deleted

## 2021-07-27 ENCOUNTER — Other Ambulatory Visit: Payer: Self-pay

## 2021-07-27 DIAGNOSIS — E538 Deficiency of other specified B group vitamins: Secondary | ICD-10-CM | POA: Diagnosis not present

## 2021-07-27 MED ORDER — CYANOCOBALAMIN 1000 MCG/ML IJ SOLN
1000.0000 ug | Freq: Once | INTRAMUSCULAR | Status: AC
  Administered 2021-07-27: 1000 ug via INTRAMUSCULAR

## 2021-07-27 NOTE — Progress Notes (Signed)
Pt arrived for B12 injection, given in L gluteal. Pt tolerated injection well, showed no signs of distress nor voiced any concerns.  ?

## 2021-08-04 ENCOUNTER — Encounter: Payer: Self-pay | Admitting: Internal Medicine

## 2021-08-04 ENCOUNTER — Other Ambulatory Visit: Payer: Self-pay

## 2021-08-04 ENCOUNTER — Ambulatory Visit (INDEPENDENT_AMBULATORY_CARE_PROVIDER_SITE_OTHER): Payer: Medicare HMO | Admitting: Internal Medicine

## 2021-08-04 VITALS — BP 148/88 | HR 95 | Temp 98.2°F | Resp 18 | Ht 64.0 in | Wt 171.2 lb

## 2021-08-04 DIAGNOSIS — M6740 Ganglion, unspecified site: Secondary | ICD-10-CM | POA: Diagnosis not present

## 2021-08-04 DIAGNOSIS — M67432 Ganglion, left wrist: Secondary | ICD-10-CM

## 2021-08-04 DIAGNOSIS — R2232 Localized swelling, mass and lump, left upper limb: Secondary | ICD-10-CM

## 2021-08-04 NOTE — Patient Instructions (Signed)
[facility]-call back here for referral or call for walk in appt here  [phone] Google reviews Orthopedic clinic in [place], [place] COVID-19 info: [email or web address] Get online care: [email or web address] Address: [address], [place], [place] [address] Hours:  Open ? Closes 9?PM Phone: [phone] Ganglion Cyst Removal Ganglion cyst removal is surgery to remove a fluid-filled sac that forms near joints or on tendons or ligaments near joints (ganglion cyst). These cysts often develop in the hand or wrist, but they can also develop in the shoulder, elbow, hip, knee, ankle, or foot. You may need surgery to remove a ganglion cyst if nonsurgical treatments have not worked. Surgery may be done if: The cyst is large and unsightly. The cyst is painful or limits movement. The cyst is pushing on a nerve and causing tingling or weakness. The cyst has come back after aspiration, which is a procedure to drain fluid from the cyst with a needle and syringe. Ganglion cyst removal can be done through a few small incisions using a thin operating scope with a camera (arthroscope). This type of surgery is called arthroscopic or minimally invasive surgery. The procedure can also be done through one larger incision (open surgery). Tell your health care provider about: Any allergies you have. All medicines you are taking, including vitamins, herbs, eye drops, creams, and over-the-counter medicines. Any problems you or family members have had with anesthetic medicines. Any blood disorders you have. Any surgeries you have had. Any medical conditions you have. Whether you are pregnant or may be pregnant. What are the risks? Generally, this is a safe procedure. However, problems may occur, including: Infection. Bleeding. Damage to nearby structures, such as tendons, nerves, or blood vessels. Stiffness or limited movement. The cyst coming back again. Allergic reactions to  medicines. What happens before the procedure? Staying hydrated Follow instructions from your health care provider about hydration, which may include: Up to 2 hours before the procedure - you may continue to drink clear liquids, such as water, clear fruit juice, black coffee, and plain tea.  Eating and drinking restrictions Follow instructions from your health care provider about eating and drinking, which may include: 8 hours before the procedure - stop eating heavy meals or foods, such as meat, fried foods, or fatty foods. 6 hours before the procedure - stop eating light meals or foods, such as toast or cereal. 6 hours before the procedure - stop drinking milk or drinks that contain milk. 2 hours before the procedure - stop drinking clear liquids. Medicines Ask your health care provider about: Changing or stopping your regular medicines. This is especially important if you are taking diabetes medicines or blood thinners. Taking medicines such as aspirin and ibuprofen. These medicines can thin your blood. Do not take these medicines unless your health care provider tells you to take them. Taking over-the-counter medicines, vitamins, herbs, and supplements. General instructions You may have an imaging test, such as an X-ray, MRI, CT scan, or ultrasound. Plan to have someone take you home from the hospital or clinic. If you will be going home right after the procedure, plan to have someone with you for 24 hours. Ask your health care provider what steps will be taken to help prevent infection. These steps may include: Removing hair at the surgery site. Washing skin with a germ-killing soap. What happens during the procedure?  An IV will be inserted into one of your veins. You will be given one or more of the following: A medicine to  help you relax (sedative). A medicine to numb the area (local anesthetic). A medicine to make you fall asleep (general anesthetic). A medicine that is  injected into an area of your body to numb everything below the injection site (regional anesthetic). A tourniquet may be placed above the area of surgery to reduce blood flow to that area during the procedure. If you are having arthroscopic surgery: Two or more small incisions will be made near the cyst. The arthroscope will be placed through one incision. Thin surgical instruments will be placed through other incisions. The camera on the scope will send pictures to a screen in the operating room. Your surgeon will use these images to guide the surgery. If you are having open surgery: A single incision will be made near the cyst. The cyst will be located through the incision. The cyst will be removed, including the cyst wall, fluid, and the stalk that attaches the cyst to the body tissue. In some cases, a piece of joint tissue, tendon, or ligament may also be removed. Incisions will be closed with stitches (sutures), skin glue, or surgical tape. A bandage (dressing) will be placed. At the end of the surgery, the tourniquet will be removed. The procedure may vary among health care providers and hospitals. What happens after the procedure? Your blood pressure, heart rate, breathing rate, and blood oxygen level will be monitored until you leave the hospital or clinic. You may have to wear a splint or brace for a few days. If you were given a sedative during the procedure, it can affect you for several hours. Do not drive or operate machinery until your health care provider says that it is safe. Your health care provider will give you instructions for taking care of yourself at home after your procedure. Summary Ganglion cyst removal is surgery to remove a fluid-filled sac that forms near joints or on tendons or ligaments near joints (ganglion cyst). You may need this surgery if aspiration and other nonsurgical treatments have not helped. This procedure may be done as an arthroscopic procedure or  as open surgery. During the procedure, the cyst is removed, including the cyst wall, fluid, and stalk. In some cases, a piece of joint tissue or tendon may also be removed. You may need to wear a splint or brace after surgery. This information is not intended to replace advice given to you by your health care provider. Make sure you discuss any questions you have with your health care provider. Document Revised: 08/07/2019 Document Reviewed: 08/07/2019 Elsevier Patient Education  2022 [employer].  Ganglion Cyst A ganglion cyst is a non-cancerous, fluid-filled lump of tissue that occurs near a joint, tendon, or ligament. The cyst grows out of a joint or the lining of a tendon or ligament. Ganglion cysts most often develop in the hand or wrist, but they can also develop in the shoulder, elbow, hip, knee, ankle, or foot. Ganglion cysts are ball-shaped or egg-shaped. Their size can range from the size of a pea to larger than a grape. Increased activity may cause the cyst to get bigger because more fluid starts to build up. What are the causes? The exact cause of this condition is not known, but it may be related to: Inflammation or irritation around the joint. An injury or tear in the layers of tissue around the joint (joint capsule). Repetitive movements or overuse. History of acute or repeated injury. What increases the risk? You are more likely to develop this condition if:  You are a female. You are 20-46 years old. What are the signs or symptoms? The main symptom of this condition is a lump. It most often appears on the hand or wrist. In many cases, there are no other symptoms, but a cyst can sometimes cause: Tingling. Pain or tenderness. Numbness. Weakness or loss of strength in the affected joint. Decreased range of motion in the affected area of the body. How is this diagnosed? Ganglion cysts are usually diagnosed based on a physical exam. Your health care provider will feel the  lump and may shine a light next to it. If it is a ganglion cyst, the light will likely shine through it. Your health care provider may order an X-ray, ultrasound, MRI, or CT scan to rule out other conditions. How is this treated? Ganglion cysts often go away on their own without treatment. If you have pain or other symptoms, treatment may be needed. Treatment is also needed if the ganglion cyst limits your movement or if it gets infected. Treatment may include: Wearing a brace or splint on your wrist or finger. Taking anti-inflammatory medicine. Having fluid drained from the lump with a needle (aspiration). Getting an injection of medicine into the joint to decrease inflammation. This may be corticosteroids, ethanol, or hyaluronidase. Having surgery to remove the ganglion cyst. Placing a pad in your shoe or wearing shoes that will not rub against the cyst if it is on your foot. Follow these instructions at home: Do not press on the ganglion cyst, poke it with a needle, or hit it. Take over-the-counter and prescription medicines only as told by your health care provider. If you have a brace or splint: Wear it as told by your health care provider. Remove it as told by your health care provider. Ask if you need to remove it when you take a shower or a bath. Watch your ganglion cyst for any changes. Keep all follow-up visits as told by your health care provider. This is important. Contact a health care provider if: Your ganglion cyst becomes larger or more painful. You have pus coming from the lump. You have weakness or numbness in the affected area. You have a fever or chills. Get help right away if: You have a fever and have any of these in the cyst area: Increased redness. Red streaks. Swelling. Summary A ganglion cyst is a non-cancerous, fluid-filled lump that occurs near a joint, tendon, or ligament. Ganglion cysts most often develop in the hand or wrist, but they can also develop in  the shoulder, elbow, hip, knee, ankle, or foot. Ganglion cysts often go away on their own without treatment. This information is not intended to replace advice given to you by your health care provider. Make sure you discuss any questions you have with your health care provider. Document Revised: 08/05/2019 Document Reviewed: 08/05/2019 Elsevier Patient Education  [place].

## 2021-08-04 NOTE — Progress Notes (Signed)
Chief Complaint  ?Patient presents with  ? Cyst  ?  Located on left wrist.  ? ?F/u  ?Lump left wrist tender to touch and itched at first wrist thumb side hurts when quilting has been quilting more and painful  ? ? ?Review of Systems  ?Constitutional:  Negative for weight loss.  ?HENT:  Negative for hearing loss.   ?Eyes:  Negative for blurred vision.  ?Respiratory:  Negative for shortness of breath.   ?Cardiovascular:  Negative for chest pain.  ?Gastrointestinal:  Negative for abdominal pain and blood in stool.  ?Genitourinary:  Negative for dysuria.  ?Musculoskeletal:  Negative for falls and joint pain.  ?Skin:  Negative for rash.  ?Neurological:  Negative for headaches.  ?Psychiatric/Behavioral:  Negative for depression.   ?Past Medical History:  ?Diagnosis Date  ? Adenocarcinoma of breast ([place]) 11/08/2016  ? Overview:  No chemo/XRT 2/2 localized lesion.  Unable to tolerate Tamoxifen.  ? Breast cancer ([place]) 2014-2015  ? left breast cancer  ? Chickenpox   ? 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 ?Past Surgical History:  ?Procedure Laterality Date  ? ABDOMINAL HYSTERECTOMY    ? APPENDECTOMY    ? BREAST BIOPSY Left   ? Stereotactic biopsy  ? BREAST BIOPSY Right 05/02/2016  ? US guided biopsy  ? MASTECTOMY Left 2015  ? MASTECTOMY W/ SENTINEL NODE BIOPSY Right 05/24/2016  ? Procedure: MASTECTOMY WITH SENTINEL LYMPH NODE BIOPSY;  Surgeon: Hubbard Robinson, MD;  Location: [facility] ORS;  Service: General;  Laterality: Right;  ? ?Family History  ?Problem Relation Age of Onset  ? Heart disease Mother   ? Prostate cancer Father   ? Stroke Brother   ? Healthy Daughter   ? ?Social History  ? ?Socioeconomic History  ? Marital status: Widowed  ?  Spouse name: Not on file  ? Number of children: Not on file  ? Years of education: Not on file  ? Highest education level: Not on file  ?Occupational History  ? Not on file  ?Tobacco Use  ? Smoking status: Former  ?  Types: Cigarettes  ?  Quit  date: 01/09/1971  ?  Years since quitting: 50.6  ? Smokeless tobacco: Never  ?Vaping Use  ? Vaping Use: Never used  ?Substance and Sexual Activity  ? Alcohol use: Yes  ?  Comment: Occasional  ? Drug use: No  ? Sexual activity: Never  ?Other Topics Concern  ? Not on file  ?Social History Narrative  ? Works 2-3 days a week at [employer].  ? ?Social Determinants of Health  ? ?Financial Resource Strain: Low Risk   ? Difficulty of Paying Living Expenses: Not hard at all  ?Food Insecurity: No Food Insecurity  ? Worried About Charity fundraiser in the Last Year: Never true  ? Ran Out of Food in the Last Year: Never true  ?Transportation Needs: No Transportation Needs  ? Lack of Transportation (Medical): No  ? Lack of Transportation (Non-Medical): No  ?Physical Activity: Not on file  ?Stress: No Stress Concern Present  ? Feeling of Stress : Not at all  ?Social Connections: Unknown  ? Frequency of Communication with Friends and Family: More than three times a week  ? Frequency of Social Gatherings with Friends and Family: More than three times a week  ? Attends Religious Services: Not on file  ? Active Member of Clubs or Organizations: Not on file  ? Attends Archivist  Meetings: Not on file  ? Marital Status: Not on file  ?Intimate Partner Violence: Not At Risk  ? Fear of Current or Ex-Partner: No  ? Emotionally Abused: No  ? Physically Abused: No  ? Sexually Abused: No  ? ?Current Meds  ?Medication Sig  ? Cholecalciferol (VITAMIN D3) 50 MCG (2000 UT) CAPS Take 4,000 Units by mouth daily in the afternoon.  ? ?Allergies  ?Allergen Reactions  ? Excedrin Extra Strength [Asa-Apap-Caff Buffered] Other (See Comments)  ?  Shaking  ? ?Recent Results (from the past 2160 hour(s))  ?Comp Met (CMET)     Status: Abnormal  ? Collection Time: 07/03/21 10:43 AM  ?Result Value Ref Range  ? Sodium 137 135 - 145 mEq/L  ? Potassium 4.9 3.5 - 5.1 mEq/L  ? Chloride 103 96 - 112 mEq/L  ? CO2 29 19 - 32 mEq/L  ? Glucose, Bld 102 (H)  70 - 99 mg/dL  ? BUN 17 6 - 23 mg/dL  ? Creatinine, Ser 0.83 0.40 - 1.20 mg/dL  ? Total Bilirubin 0.5 0.2 - 1.2 mg/dL  ? Alkaline Phosphatase 88 39 - 117 U/L  ? AST 17 0 - 37 U/L  ? ALT 25 0 - 35 U/L  ? Total Protein 7.1 6.0 - 8.3 g/dL  ? Albumin 4.2 3.5 - 5.2 g/dL  ? GFR 65.10 >60.00 mL/min  ?  Comment: Calculated using the CKD-EPI Creatinine Equation (2021)  ? Calcium 9.6 8.4 - 10.5 mg/dL  ?HgB A1c     Status: None  ? Collection Time: 07/03/21 10:43 AM  ?Result Value Ref Range  ? Hgb A1c MFr Bld 6.4 4.6 - 6.5 %  ?  Comment: Glycemic Control Guidelines for People with Diabetes:Non Diabetic:  <6%Goal of Therapy: <7%Additional Action Suggested:  >8%   ?Lipid panel     Status: Abnormal  ? Collection Time: 07/03/21 10:43 AM  ?Result Value Ref Range  ? Cholesterol 234 (H) 0 - 200 mg/dL  ?  Comment: ATP III Classification       Desirable:  < 200 mg/dL               Borderline High:  200 - 239 mg/dL          High:  > = 240 mg/dL  ? Triglycerides 182.0 (H) 0.0 - 149.0 mg/dL  ?  Comment: Normal:  <150 mg/dLBorderline High:  150 - 199 mg/dL  ? HDL 58.20 >39.00 mg/dL  ? VLDL 36.4 0.0 - 40.0 mg/dL  ? LDL Cholesterol 140 (H) 0 - 99 mg/dL  ? Total CHOL/HDL Ratio 4   ?  Comment:                Men          Women1/2 Average Risk     3.4          3.3Average Risk          5.0          4.42X Average Risk          9.6          7.13X Average Risk          15.0          11.0                      ? NonHDL 176.23   ?  Comment: NOTE:  Non-HDL goal should be 30 mg/dL higher than patient's LDL goal (  i.e. LDL goal of < 70 mg/dL, would have non-HDL goal of < 100 mg/dL)  ?Vitamin D (25 hydroxy)     Status: None  ? Collection Time: 07/03/21 10:43 AM  ?Result Value Ref Range  ? VITD 32.33 30.00 - 100.00 ng/mL  ? ?Objective  ?Body mass index is 29.39 kg/m?. ?Wt Readings from Last 3 Encounters:  ?08/04/21 171 lb 4 oz (77.7 kg)  ?07/03/21 170 lb 9.6 oz (77.4 kg)  ?06/30/21 173 lb (78.5 kg)  ? ?Temp Readings from Last 3 Encounters:  ?08/04/21 98.2 ?F  (36.8 ?C) (Oral)  ?07/03/21 98 ?F (36.7 ?C) (Oral)  ?06/29/20 98.2 ?F (36.8 ?C) (Oral)  ? ?BP Readings from Last 3 Encounters:  ?08/04/21 (!) 148/88  ?07/03/21 140/80  ?06/29/20 120/80  ? ?Pulse Readings from Last 3 Encounters:  ?08/04/21 95  ?07/03/21 89  ?06/29/20 83  ? ? ?Physical Exam ?Vitals and nursing note reviewed.  ?Constitutional:   ?   Appearance: Normal appearance. She is well-developed and well-groomed.  ?HENT:  ?   Head: Normocephalic and atraumatic.  ?Eyes:  ?   Conjunctiva/sclera: Conjunctivae normal.  ?   Pupils: Pupils are equal, round, and reactive to light.  ?Cardiovascular:  ?   Rate and Rhythm: Normal rate and regular rhythm.  ?   Heart sounds: Normal heart sounds. No murmur heard. ?Pulmonary:  ?   Effort: Pulmonary effort is normal.  ?   Breath sounds: Normal breath sounds.  ?Abdominal:  ?   General: Abdomen is flat. Bowel sounds are normal.  ?   Tenderness: There is no abdominal tenderness.  ?Musculoskeletal:     ?   General: No tenderness.  ?   Left wrist: Normal.  ?     Arms: ? ?Skin: ?   General: Skin is warm and dry.  ?Neurological:  ?   General: No focal deficit present.  ?   Mental Status: She is alert and oriented to person, place, and time. Mental status is at baseline.  ?   Cranial Nerves: Cranial nerves 2-12 are intact.  ?   Motor: Motor function is intact.  ?   Coordination: Coordination is intact.  ?   Gait: Gait is intact.  ?Psychiatric:     ?   Attention and Perception: Attention and perception normal.     ?   Mood and Affect: Mood and affect normal.     ?   Speech: Speech normal.     ?   Behavior: Behavior normal. Behavior is cooperative.     ?   Thought Content: Thought content normal.     ?   Cognition and Memory: Cognition and memory normal.     ?   Judgment: Judgment normal.  ? ? ?Assessment  ?Plan  ?Mass of left wrist ? ?Ganglion cyst ? ?Ganglion of left wrist likely from overuse with quilting  ?Let me know if [facility]  ?referral desired for now pt wants to wait until  08/29/21 appt with PCP ?Provider: Dr. Olivia Mackie McLean-Scocuzza-Internal Medicine  ?

## 2021-08-07 ENCOUNTER — Ambulatory Visit: Payer: Medicare HMO | Admitting: Family Medicine

## 2021-08-29 ENCOUNTER — Ambulatory Visit (INDEPENDENT_AMBULATORY_CARE_PROVIDER_SITE_OTHER): Payer: Medicare HMO

## 2021-08-29 DIAGNOSIS — E538 Deficiency of other specified B group vitamins: Secondary | ICD-10-CM

## 2021-08-29 MED ORDER — CYANOCOBALAMIN 1000 MCG/ML IJ SOLN
1000.0000 ug | Freq: Once | INTRAMUSCULAR | Status: AC
  Administered 2021-08-29: 1000 ug via INTRAMUSCULAR

## 2021-08-29 NOTE — Progress Notes (Signed)
Patient presented for B 12 injection to left gluteal per request & patient voiced no concerns nor showed any signs of distress during injection. ? ?

## 2021-09-29 ENCOUNTER — Ambulatory Visit (INDEPENDENT_AMBULATORY_CARE_PROVIDER_SITE_OTHER): Payer: Medicare HMO

## 2021-09-29 DIAGNOSIS — E538 Deficiency of other specified B group vitamins: Secondary | ICD-10-CM

## 2021-09-29 MED ORDER — CYANOCOBALAMIN 1000 MCG/ML IJ SOLN
1000.0000 ug | Freq: Once | INTRAMUSCULAR | Status: AC
  Administered 2021-09-29: 1000 ug via INTRAMUSCULAR

## 2021-09-29 NOTE — Progress Notes (Signed)
Patient presented for B 12 injection to right gluteal muscle, patient voiced no concerns nor showed any signs of distress during injection. ?

## 2021-10-31 ENCOUNTER — Ambulatory Visit (INDEPENDENT_AMBULATORY_CARE_PROVIDER_SITE_OTHER): Payer: Medicare HMO

## 2021-10-31 DIAGNOSIS — E538 Deficiency of other specified B group vitamins: Secondary | ICD-10-CM

## 2021-10-31 MED ORDER — CYANOCOBALAMIN 1000 MCG/ML IJ SOLN
1000.0000 ug | Freq: Once | INTRAMUSCULAR | Status: AC
  Administered 2021-10-31: 1000 ug via INTRAMUSCULAR

## 2021-10-31 NOTE — Progress Notes (Addendum)
Pt presented today for b12 injection. Left gluteal muscle. Pt voiced no concerns nor showed any signs of distress during injection.

## 2021-11-08 DIAGNOSIS — Z961 Presence of intraocular lens: Secondary | ICD-10-CM | POA: Diagnosis not present

## 2021-11-08 DIAGNOSIS — Z0100 Encounter for examination of eyes and vision without abnormal findings: Secondary | ICD-10-CM | POA: Diagnosis not present

## 2021-11-08 DIAGNOSIS — H524 Presbyopia: Secondary | ICD-10-CM | POA: Diagnosis not present

## 2021-11-08 DIAGNOSIS — H04123 Dry eye syndrome of bilateral lacrimal glands: Secondary | ICD-10-CM | POA: Diagnosis not present

## 2021-11-08 DIAGNOSIS — H02051 Trichiasis without entropian right upper eyelid: Secondary | ICD-10-CM | POA: Diagnosis not present

## 2021-11-08 DIAGNOSIS — H40023 Open angle with borderline findings, high risk, bilateral: Secondary | ICD-10-CM | POA: Diagnosis not present

## 2021-11-30 ENCOUNTER — Ambulatory Visit (INDEPENDENT_AMBULATORY_CARE_PROVIDER_SITE_OTHER): Payer: Medicare HMO

## 2021-11-30 DIAGNOSIS — E538 Deficiency of other specified B group vitamins: Secondary | ICD-10-CM

## 2021-11-30 MED ORDER — CYANOCOBALAMIN 1000 MCG/ML IJ SOLN
1000.0000 ug | Freq: Once | INTRAMUSCULAR | Status: AC
  Administered 2021-11-30: 1000 ug via INTRAMUSCULAR

## 2021-11-30 NOTE — Progress Notes (Signed)
Patient presented for B 12 injection to right ventrogluteal patient voiced no concerns nor showed any signs of distress during injection

## 2022-01-02 ENCOUNTER — Ambulatory Visit (INDEPENDENT_AMBULATORY_CARE_PROVIDER_SITE_OTHER): Payer: Medicare HMO

## 2022-01-02 DIAGNOSIS — E538 Deficiency of other specified B group vitamins: Secondary | ICD-10-CM

## 2022-01-02 MED ORDER — CYANOCOBALAMIN 1000 MCG/ML IJ SOLN
1000.0000 ug | Freq: Once | INTRAMUSCULAR | Status: AC
  Administered 2022-01-02: 1000 ug via INTRAMUSCULAR

## 2022-01-02 NOTE — Progress Notes (Signed)
Pt arrived for B12 injection, given in L ventrogluteal. Pt tolerated injection well, showed no signs of distress nor voiced any concerns.

## 2022-02-02 ENCOUNTER — Ambulatory Visit (INDEPENDENT_AMBULATORY_CARE_PROVIDER_SITE_OTHER): Payer: Medicare HMO | Admitting: *Deleted

## 2022-02-02 DIAGNOSIS — E538 Deficiency of other specified B group vitamins: Secondary | ICD-10-CM

## 2022-02-02 MED ORDER — CYANOCOBALAMIN 1000 MCG/ML IJ SOLN
1000.0000 ug | Freq: Once | INTRAMUSCULAR | Status: AC
  Administered 2022-02-02: 1000 ug via INTRAMUSCULAR

## 2022-02-02 NOTE — Progress Notes (Signed)
Pt received B12 injection in the right gluteal muscle. patient tolerated it well with no complaints or concerns

## 2022-03-07 ENCOUNTER — Ambulatory Visit (INDEPENDENT_AMBULATORY_CARE_PROVIDER_SITE_OTHER): Payer: Medicare HMO | Admitting: *Deleted

## 2022-03-07 DIAGNOSIS — E538 Deficiency of other specified B group vitamins: Secondary | ICD-10-CM | POA: Diagnosis not present

## 2022-03-07 MED ORDER — CYANOCOBALAMIN 1000 MCG/ML IJ SOLN
1000.0000 ug | Freq: Once | INTRAMUSCULAR | Status: AC
  Administered 2022-03-07: 1000 ug via INTRAMUSCULAR

## 2022-03-07 NOTE — Progress Notes (Signed)
Pt received B12 injection in left gluteal muscle. Pt tolerated it well, with no compliants.

## 2022-04-10 ENCOUNTER — Ambulatory Visit (INDEPENDENT_AMBULATORY_CARE_PROVIDER_SITE_OTHER): Payer: Medicare HMO

## 2022-04-10 DIAGNOSIS — E538 Deficiency of other specified B group vitamins: Secondary | ICD-10-CM | POA: Diagnosis not present

## 2022-04-10 MED ORDER — CYANOCOBALAMIN 1000 MCG/ML IJ SOLN
1000.0000 ug | Freq: Once | INTRAMUSCULAR | Status: AC
  Administered 2022-04-10: 1000 ug via INTRAMUSCULAR

## 2022-04-10 NOTE — Progress Notes (Signed)
Pt presents today for a b12 injection. Right gluteal muscle. Pt voiced no concerns nor showed any signs of distress during injection.

## 2022-05-10 ENCOUNTER — Ambulatory Visit (INDEPENDENT_AMBULATORY_CARE_PROVIDER_SITE_OTHER): Payer: Medicare HMO

## 2022-05-10 DIAGNOSIS — E538 Deficiency of other specified B group vitamins: Secondary | ICD-10-CM | POA: Diagnosis not present

## 2022-05-10 MED ORDER — CYANOCOBALAMIN 1000 MCG/ML IJ SOLN
1000.0000 ug | Freq: Once | INTRAMUSCULAR | Status: AC
  Administered 2022-05-10: 1000 ug via INTRAMUSCULAR

## 2022-05-10 NOTE — Progress Notes (Signed)
Pt presented for their vitamin B12 injection. Pt was identified through two identifiers. Pt tolerated shot well in their left ventrogluteal area .

## 2022-06-11 ENCOUNTER — Ambulatory Visit (INDEPENDENT_AMBULATORY_CARE_PROVIDER_SITE_OTHER): Payer: Medicare HMO

## 2022-06-11 DIAGNOSIS — E538 Deficiency of other specified B group vitamins: Secondary | ICD-10-CM | POA: Diagnosis not present

## 2022-06-11 MED ORDER — CYANOCOBALAMIN 1000 MCG/ML IJ SOLN
1000.0000 ug | Freq: Once | INTRAMUSCULAR | Status: AC
  Administered 2022-06-11: 1000 ug via INTRAMUSCULAR

## 2022-06-11 NOTE — Progress Notes (Signed)
Pt presented for their vitamin B12 injection. Pt was identified through two identifiers. Pt tolerated shot well in their right Ventrogluteal.

## 2022-06-25 ENCOUNTER — Telehealth: Payer: Self-pay | Admitting: Family Medicine

## 2022-06-25 NOTE — Telephone Encounter (Signed)
Left message for patient to call back and schedule Medicare Annual Wellness Visit (AWV).   Please offer to do virtually or by telephone.  Left office number and my jabber [phone].  Last AWV:06/30/2021   Please schedule at anytime with Nurse Health Advisor.

## 2022-07-04 ENCOUNTER — Ambulatory Visit (INDEPENDENT_AMBULATORY_CARE_PROVIDER_SITE_OTHER): Payer: Medicare HMO | Admitting: Family Medicine

## 2022-07-04 ENCOUNTER — Encounter: Payer: Self-pay | Admitting: Family Medicine

## 2022-07-04 VITALS — BP 120/78 | HR 91 | Temp 97.9°F | Ht 64.0 in | Wt 172.2 lb

## 2022-07-04 DIAGNOSIS — Z0001 Encounter for general adult medical examination with abnormal findings: Secondary | ICD-10-CM | POA: Diagnosis not present

## 2022-07-04 DIAGNOSIS — E538 Deficiency of other specified B group vitamins: Secondary | ICD-10-CM

## 2022-07-04 DIAGNOSIS — M8580 Other specified disorders of bone density and structure, unspecified site: Secondary | ICD-10-CM | POA: Diagnosis not present

## 2022-07-04 DIAGNOSIS — E559 Vitamin D deficiency, unspecified: Secondary | ICD-10-CM | POA: Diagnosis not present

## 2022-07-04 DIAGNOSIS — R7303 Prediabetes: Secondary | ICD-10-CM | POA: Diagnosis not present

## 2022-07-04 DIAGNOSIS — E785 Hyperlipidemia, unspecified: Secondary | ICD-10-CM

## 2022-07-04 DIAGNOSIS — M6740 Ganglion, unspecified site: Secondary | ICD-10-CM | POA: Diagnosis not present

## 2022-07-04 LAB — COMPREHENSIVE METABOLIC PANEL
ALT: 24 U/L (ref 0–35)
AST: 20 U/L (ref 0–37)
Albumin: 4.4 g/dL (ref 3.5–5.2)
Alkaline Phosphatase: 86 U/L (ref 39–117)
BUN: 17 mg/dL (ref 6–23)
CO2: 27 mEq/L (ref 19–32)
Calcium: 9.5 mg/dL (ref 8.4–10.5)
Chloride: 103 mEq/L (ref 96–112)
Creatinine, Ser: 0.84 mg/dL (ref 0.40–1.20)
GFR: 63.72 mL/min (ref >60.00)
Glucose, Bld: 99 mg/dL (ref 70–99)
Potassium: 5.4 mEq/L — ABNORMAL HIGH (ref 3.5–5.1)
Sodium: 139 mEq/L (ref 135–145)
Total Bilirubin: 0.3 mg/dL (ref 0.2–1.2)
Total Protein: 7.4 g/dL (ref 6.0–8.3)

## 2022-07-04 LAB — LIPID PANEL
Cholesterol: 233 mg/dL — ABNORMAL HIGH (ref 0–200)
HDL: 62.6 mg/dL (ref >39.00)
NonHDL: 169.92
Total CHOL/HDL Ratio: 4
Triglycerides: 243 mg/dL — ABNORMAL HIGH (ref 0.0–149.0)
VLDL: 48.6 mg/dL — ABNORMAL HIGH (ref 0.0–40.0)

## 2022-07-04 LAB — HEMOGLOBIN A1C: Hgb A1c MFr Bld: 6.2 % (ref 4.6–6.5)

## 2022-07-04 LAB — LDL CHOLESTEROL, DIRECT: Direct LDL: 115 mg/dL

## 2022-07-04 LAB — VITAMIN D 25 HYDROXY (VIT D DEFICIENCY, FRACTURES): VITD: 28.09 ng/mL — ABNORMAL LOW (ref 30.00–100.00)

## 2022-07-04 MED ORDER — CYANOCOBALAMIN 1000 MCG/ML IJ SOLN
1000.0000 ug | Freq: Once | INTRAMUSCULAR | Status: AC
  Administered 2022-07-04: 1000 ug via INTRAMUSCULAR

## 2022-07-04 NOTE — Progress Notes (Signed)
Tommi Rumps, MD Phone: [phone]  Kristin Valdez is a 87 y.o. female who presents today for CPE.  Diet: generally healthy, has something sweet after dinner though nothing too much, has occasional diet soda Exercise: active, though no specific exercise.  Pap smear: s/p total hysterectomy Colonoscopy: aged out Mammogram: s/p bilateral mastectomy  Family history-  Prostate cancer: father  Colon cancer: no  Breast cancer: no  Ovarian cancer: no Menses: s/p hysterectomy Vaccines-   Flu: declines  Tetanus: UTD  Shingles: declines  COVID19: declines  Pneumonia: declines  RSV: declines Tobacco use: quit 50 years ago Alcohol use: 1 glass of wine nightly Illicit Drug use: no Dentist: yes Ophthalmology: yes Wrist cyst: Patient notes left radial volar wrist cyst has been present for some time.  Notes he gets worse when she quilts.  There is no pain.  It is going down some since she has not qualitate in the last several weeks.   Active Ambulatory Problems    Diagnosis Date Noted   Osteopenia 05/15/2016   Vitamin D deficiency 05/15/2016   Personal history of malignant neoplasm of breast 11/14/2016   Eustachian tube dysfunction, right 06/25/2018   Seborrheic keratoses 06/25/2018   B12 deficiency 04/03/2019   HLD (hyperlipidemia) 06/15/2019   Prediabetes 06/15/2019   Toe pain, left 06/29/2020   Encounter for general adult medical examination with abnormal findings 07/03/2021   Left knee pain 07/03/2021   Rash 07/03/2021   Tinnitus 07/03/2021   Ganglion cyst 07/04/2022   Resolved Ambulatory Problems    Diagnosis Date Noted   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   Routine general medical examination at a health care facility 09/21/2016   Adenocarcinoma of breast ([place]) 11/08/2016   Pain of right thumb 11/29/2016   Past Medical History:  Diagnosis Date   Breast cancer ([place]) 2014-2015   Chickenpox     Family History  Problem  Relation Age of Onset   Heart disease Mother    Prostate cancer Father    Stroke Brother    Healthy Daughter     Social History   Socioeconomic History   Marital status: Widowed    Spouse name: Not on file   Number of children: Not on file   Years of education: Not on file   Highest education level: Not on file  Occupational History   Not on file  Tobacco Use   Smoking status: Former    Types: Cigarettes    Quit date: 01/09/1971    Years since quitting: 51.5   Smokeless tobacco: Never  Vaping Use   Vaping Use: Never used  Substance and Sexual Activity   Alcohol use: Yes    Comment: Occasional   Drug use: No   Sexual activity: Never  Other Topics Concern   Not on file  Social History Narrative   Works 2-3 days a week at [employer].   Social Determinants of Health   Financial Resource Strain: Low Risk  (06/30/2021)   Overall Financial Resource Strain (CARDIA)    Difficulty of Paying Living Expenses: Not hard at all  Food Insecurity: No Food Insecurity (06/30/2021)   Hunger Vital Sign    Worried About Running Out of Food in the Last Year: Never true    Ran Out of Food in the Last Year: Never true  Transportation Needs: No Transportation Needs (06/30/2021)   PRAPARE - Transportation    Lack of Transportation (Medical): No    Lack of  Transportation (Non-Medical): No  Physical Activity: Not on file  Stress: No Stress Concern Present (06/30/2021)   [place]    Feeling of Stress : Not at all  Social Connections: Unknown (06/30/2021)   Social Connection and Isolation Panel [NHANES]    Frequency of Communication with Friends and Family: More than three times a week    Frequency of Social Gatherings with Friends and Family: More than three times a week    Attends Religious Services: Not on file    Active Member of [place] or Organizations: Not on file    Attends Archivist Meetings: Not on file     Marital Status: Not on file  Intimate Partner Violence: Not At Risk (06/30/2021)   Humiliation, Afraid, Rape, and Kick questionnaire    Fear of Current or Ex-Partner: No    Emotionally Abused: No    Physically Abused: No    Sexually Abused: No    ROS  General:  Negative for nexplained weight loss, fever Skin: Negative for new or changing mole, sore that won't heal HEENT: Positive for tinnitus (chronic and unchanged), Negative for trouble hearing, trouble seeing, mouth sores, hoarseness, change in voice, dysphagia. CV:  Negative for chest pain, dyspnea, edema, palpitations Resp: Negative for cough, dyspnea, hemoptysis GI: Negative for nausea, vomiting, diarrhea, constipation, abdominal pain, melena, hematochezia. GU: Negative for dysuria, incontinence, urinary hesitance, hematuria, vaginal or penile discharge, polyuria, sexual difficulty, lumps in testicle or breasts MSK: positive for joint pain (chronic), Negative for muscle cramps or aches, joint swelling Neuro: Negative for headaches, weakness, numbness, dizziness, passing out/fainting Psych: Negative for depression, anxiety, memory problems  Objective  Physical Exam Vitals:   07/04/22 1020 07/04/22 1048  BP: (!) 140/78 120/78  Pulse: 91   Temp: 97.9 F (36.6 C)   SpO2: 97%     BP Readings from Last 3 Encounters:  07/04/22 120/78  08/04/21 (!) 148/88  07/03/21 140/80   Wt Readings from Last 3 Encounters:  07/04/22 172 lb 3.2 oz (78.1 kg)  08/04/21 171 lb 4 oz (77.7 kg)  07/03/21 170 lb 9.6 oz (77.4 kg)    Physical Exam Constitutional:      General: She is not in acute distress.    Appearance: She is not diaphoretic.  Cardiovascular:     Rate and Rhythm: Normal rate and regular rhythm.     Heart sounds: Normal heart sounds.  Pulmonary:     Effort: Pulmonary effort is normal.     Breath sounds: Normal breath sounds.  Abdominal:     General: Bowel sounds are normal. There is no distension.     Palpations: Abdomen  is soft.     Tenderness: There is no abdominal tenderness.  Musculoskeletal:     Right lower leg: No edema.     Left lower leg: No edema.     Comments: Likely ganglion cyst at her left volar radial wrist, nontender, the lesion is soft though is somewhat firmer than the typical ganglion cyst though is not as firm as bone  Skin:    General: Skin is warm and dry.  Neurological:     Mental Status: She is alert.  Psychiatric:        Mood and Affect: Mood normal.      Assessment/Plan:   Encounter for general adult medical examination with abnormal findings Assessment & Plan: Physical exam completed.  Encouraged continued healthy diet and increasing her activity level.  Discussed flu  vaccination, COVID vaccination, Shingrix vaccination, pneumonia vaccination, and RSV vaccination.  Patient declines these.  She understands the purpose of them.  She will call to schedule her bone density scan.  Lab work as outlined.   Osteopenia, unspecified location -     DG Bone Density; Future  Ganglion cyst Assessment & Plan: Discussed having her see orthopedics to have the area drained though she defers at this time.  She will let me know if she changes her mind.   Hyperlipidemia, unspecified hyperlipidemia type -     Comprehensive metabolic panel -     Lipid panel  Vitamin D deficiency -     VITAMIN D 25 Hydroxy (Vit-D Deficiency, Fractures)  Prediabetes -     Hemoglobin A1c  B12 deficiency -     Cyanocobalamin    Return in about 1 year (around 07/05/2023) for physical.   Tommi Rumps, MD [place]

## 2022-07-04 NOTE — Assessment & Plan Note (Signed)
Physical exam completed.  Encouraged continued healthy diet and increasing her activity level.  Discussed flu vaccination, COVID vaccination, Shingrix vaccination, pneumonia vaccination, and RSV vaccination.  Patient declines these.  She understands the purpose of them.  She will call to schedule her bone density scan.  Lab work as outlined.

## 2022-07-04 NOTE — Patient Instructions (Signed)
Nice to see you. Please call [phone] to schedule your bone density scan. We will contact you with the lab results. If you like to see somebody for the cyst on your wrist please let me know.

## 2022-07-04 NOTE — Progress Notes (Signed)
presents today for injection per MD orders. B12 injection administered IM in left Upper Arm. Administration without incident. Patient tolerated well.  Kristin Valdez,cma   

## 2022-07-04 NOTE — Assessment & Plan Note (Signed)
Discussed having her see orthopedics to have the area drained though she defers at this time.  She will let me know if she changes her mind.

## 2022-07-05 ENCOUNTER — Other Ambulatory Visit: Payer: Self-pay

## 2022-07-05 DIAGNOSIS — E875 Hyperkalemia: Secondary | ICD-10-CM

## 2022-07-09 ENCOUNTER — Other Ambulatory Visit (INDEPENDENT_AMBULATORY_CARE_PROVIDER_SITE_OTHER): Payer: Medicare HMO

## 2022-07-09 DIAGNOSIS — E875 Hyperkalemia: Secondary | ICD-10-CM | POA: Diagnosis not present

## 2022-07-09 LAB — POTASSIUM: Potassium: 4.2 mEq/L (ref 3.5–5.1)

## 2022-07-12 ENCOUNTER — Ambulatory Visit: Payer: Medicare HMO

## 2022-07-12 ENCOUNTER — Other Ambulatory Visit: Payer: Self-pay

## 2022-07-12 DIAGNOSIS — E559 Vitamin D deficiency, unspecified: Secondary | ICD-10-CM

## 2022-07-26 ENCOUNTER — Ambulatory Visit
Admission: RE | Admit: 2022-07-26 | Discharge: 2022-07-26 | Disposition: A | Discharge status: Home / Self Care | Payer: Medicare HMO | Source: Ambulatory Visit | Attending: Family Medicine | Admitting: Family Medicine

## 2022-07-26 DIAGNOSIS — C50911 Malignant neoplasm of unspecified site of right female breast: Secondary | ICD-10-CM | POA: Insufficient documentation

## 2022-07-26 DIAGNOSIS — Z780 Asymptomatic menopausal state: Secondary | ICD-10-CM | POA: Diagnosis not present

## 2022-07-26 DIAGNOSIS — M8580 Other specified disorders of bone density and structure, unspecified site: Secondary | ICD-10-CM | POA: Diagnosis present

## 2022-07-26 DIAGNOSIS — E559 Vitamin D deficiency, unspecified: Secondary | ICD-10-CM | POA: Diagnosis not present

## 2022-07-26 DIAGNOSIS — M8589 Other specified disorders of bone density and structure, multiple sites: Secondary | ICD-10-CM | POA: Insufficient documentation

## 2022-07-30 ENCOUNTER — Telehealth: Payer: Self-pay | Admitting: Family Medicine

## 2022-07-30 NOTE — Telephone Encounter (Signed)
Contacted Lauris Chroman to schedule their annual wellness visit. Appointment made for 08/09/2022.  Thank you,  [place] Direct dial  [phone]

## 2022-08-02 ENCOUNTER — Ambulatory Visit (INDEPENDENT_AMBULATORY_CARE_PROVIDER_SITE_OTHER): Payer: Medicare HMO

## 2022-08-02 DIAGNOSIS — E538 Deficiency of other specified B group vitamins: Secondary | ICD-10-CM

## 2022-08-02 MED ORDER — CYANOCOBALAMIN 1000 MCG/ML IJ SOLN
1000.0000 ug | Freq: Once | INTRAMUSCULAR | Status: AC
  Administered 2022-08-02: 1000 ug via INTRAMUSCULAR

## 2022-08-02 NOTE — Progress Notes (Signed)
Patient arrived for her B12 injection. Patient was administered her B12 injection into her left ventrogluteal. Patient tolerated the B12 injection well and did not show any signs of distress or voice any concerns.

## 2022-08-09 ENCOUNTER — Ambulatory Visit (INDEPENDENT_AMBULATORY_CARE_PROVIDER_SITE_OTHER): Payer: Medicare HMO

## 2022-08-09 VITALS — Ht 64.0 in | Wt 172.0 lb

## 2022-08-09 DIAGNOSIS — Z0000 Encounter for general adult medical examination without abnormal findings: Secondary | ICD-10-CM | POA: Diagnosis not present

## 2022-08-09 NOTE — Patient Instructions (Addendum)
Kristin Valdez , Thank you for taking time to come for your Medicare Wellness Visit. I appreciate your ongoing commitment to your health goals. Please review the following plan we discussed and let me know if I can assist you in the future.   These are the goals we discussed:  Goals      Maintain Healthy Lifestyle     Stay active Exercise         This is a list of the screening recommended for you and due dates:  Health Maintenance  Topic Date Due   Flu Shot  08/26/2022*   Medicare Annual Wellness Visit  08/09/2023   DTaP/Tdap/Td vaccine (2 - Td or Tdap) 10/30/2023   DEXA scan (bone density measurement)  Completed   HPV Vaccine  Aged Out   Pneumonia Vaccine  Discontinued   COVID-19 Vaccine  Discontinued   Zoster (Shingles) Vaccine  Discontinued  *Topic was postponed. The date shown is not the original due date.   Conditions/risks identified: none new  Next appointment: Follow up in one year for your annual wellness visit    Preventive Care 65 Years and Older, Female Preventive care refers to lifestyle choices and visits with your health care provider that can promote health and wellness. What does preventive care include? A yearly physical exam. This is also called an annual well check. Dental exams once or twice a year. Routine eye exams. Ask your health care provider how often you should have your eyes checked. Personal lifestyle choices, including: Daily care of your teeth and gums. Regular physical activity. Eating a healthy diet. Avoiding tobacco and drug use. Limiting alcohol use. Practicing safe sex. Taking low-dose aspirin every day. Taking vitamin and mineral supplements as recommended by your health care provider. What happens during an annual well check? The services and screenings done by your health care provider during your annual well check will depend on your age, overall health, lifestyle risk factors, and family history of disease. Counseling  Your health  care provider may ask you questions about your: Alcohol use. Tobacco use. Drug use. Emotional well-being. Home and relationship well-being. Sexual activity. Eating habits. History of falls. Memory and ability to understand (cognition). Work and work Statistician. Reproductive health. Screening  You may have the following tests or measurements: Height, weight, and BMI. Blood pressure. Lipid and cholesterol levels. These may be checked every 5 years, or more frequently if you are over 25 years old. Skin check. Lung cancer screening. You may have this screening every year starting at age 16 if you have a 30-pack-year history of smoking and currently smoke or have quit within the past 15 years. Fecal occult blood test (FOBT) of the stool. You may have this test every year starting at age 87. Flexible sigmoidoscopy or colonoscopy. You may have a sigmoidoscopy every 5 years or a colonoscopy every 10 years starting at age 61. Hepatitis C blood test. Hepatitis B blood test. Sexually transmitted disease (STD) testing. Diabetes screening. This is done by checking your blood sugar (glucose) after you have not eaten for a while (fasting). You may have this done every 1-3 years. Bone density scan. This is done to screen for osteoporosis. You may have this done starting at age 79. Mammogram. This may be done every 1-2 years. Talk to your health care provider about how often you should have regular mammograms. Talk with your health care provider about your test results, treatment options, and if necessary, the need for more tests. Vaccines  Your health care provider may recommend certain vaccines, such as: Influenza vaccine. This is recommended every year. Tetanus, diphtheria, and acellular pertussis (Tdap, Td) vaccine. You may need a Td booster every 10 years. Zoster vaccine. You may need this after age 30. Pneumococcal 13-valent conjugate (PCV13) vaccine. One dose is recommended after age  19. Pneumococcal polysaccharide (PPSV23) vaccine. One dose is recommended after age 33. Talk to your health care provider about which screenings and vaccines you need and how often you need them. This information is not intended to replace advice given to you by your health care provider. Make sure you discuss any questions you have with your health care provider. Document Released: 06/10/2015 Document Revised: 02/01/2016 Document Reviewed: 03/15/2015 Elsevier Interactive Patient Education  2017 [employer] Prevention in the Home Falls can cause injuries. They can happen to people of all ages. There are many things you can do to make your home safe and to help prevent falls. What can I do on the outside of my home? Regularly fix the edges of walkways and driveways and fix any cracks. Remove anything that might make you trip as you walk through a door, such as a raised step or threshold. Trim any bushes or trees on the path to your home. Use bright outdoor lighting. Clear any walking paths of anything that might make someone trip, such as rocks or tools. Regularly check to see if handrails are loose or broken. Make sure that both sides of any steps have handrails. Any raised decks and porches should have guardrails on the edges. Have any leaves, snow, or ice cleared regularly. Use sand or salt on walking paths during winter. Clean up any spills in your garage right away. This includes oil or grease spills. What can I do in the bathroom? Use night lights. Install grab bars by the toilet and in the tub and shower. Do not use towel bars as grab bars. Use non-skid mats or decals in the tub or shower. If you need to sit down in the shower, use a plastic, non-slip stool. Keep the floor dry. Clean up any water that spills on the floor as soon as it happens. Remove soap buildup in the tub or shower regularly. Attach bath mats securely with double-sided non-slip rug tape. Do not have throw  rugs and other things on the floor that can make you trip. What can I do in the bedroom? Use night lights. Make sure that you have a light by your bed that is easy to reach. Do not use any sheets or blankets that are too big for your bed. They should not hang down onto the floor. Have a firm chair that has side arms. You can use this for support while you get dressed. Do not have throw rugs and other things on the floor that can make you trip. What can I do in the kitchen? Clean up any spills right away. Avoid walking on wet floors. Keep items that you use a lot in easy-to-reach places. If you need to reach something above you, use a strong step stool that has a grab bar. Keep electrical cords out of the way. Do not use floor polish or wax that makes floors slippery. If you must use wax, use non-skid floor wax. Do not have throw rugs and other things on the floor that can make you trip. What can I do with my stairs? Do not leave any items on the stairs. Make sure that there are  handrails on both sides of the stairs and use them. Fix handrails that are broken or loose. Make sure that handrails are as long as the stairways. Check any carpeting to make sure that it is firmly attached to the stairs. Fix any carpet that is loose or worn. Avoid having throw rugs at the top or bottom of the stairs. If you do have throw rugs, attach them to the floor with carpet tape. Make sure that you have a light switch at the top of the stairs and the bottom of the stairs. If you do not have them, ask someone to add them for you. What else can I do to help prevent falls? Wear shoes that: Do not have high heels. Have rubber bottoms. Are comfortable and fit you well. Are closed at the toe. Do not wear sandals. If you use a stepladder: Make sure that it is fully opened. Do not climb a closed stepladder. Make sure that both sides of the stepladder are locked into place. Ask someone to hold it for you, if  possible. Clearly mark and make sure that you can see: Any grab bars or handrails. First and last steps. Where the edge of each step is. Use tools that help you move around (mobility aids) if they are needed. These include: Canes. Walkers. Scooters. Crutches. Turn on the lights when you go into a dark area. Replace any light bulbs as soon as they burn out. Set up your furniture so you have a clear path. Avoid moving your furniture around. If any of your floors are uneven, fix them. If there are any pets around you, be aware of where they are. Review your medicines with your doctor. Some medicines can make you feel dizzy. This can increase your chance of falling. Ask your doctor what other things that you can do to help prevent falls. This information is not intended to replace advice given to you by your health care provider. Make sure you discuss any questions you have with your health care provider. Document Released: 03/10/2009 Document Revised: 10/20/2015 Document Reviewed: 06/18/2014 Elsevier Interactive Patient Education  2017 [employer].

## 2022-08-09 NOTE — Progress Notes (Signed)
Subjective:   Kristin Valdez is a 87 y.o. female who presents for Medicare Annual (Subsequent) preventive examination.  Review of Systems    No ROS.  Medicare Wellness Virtual Visit.  Visual/audio telehealth visit, UTA vital signs.   See social history for additional risk factors.   Cardiac Risk Factors include: advanced age (>53mn, >>65women)     Objective:    Today's Vitals   08/09/22 1009  Weight: 172 lb (78 kg)  Height: '5\' 4"'$  (1.626 m)   Body mass index is 29.52 kg/m.     08/09/2022   10:08 AM 06/30/2021    2:12 PM 06/29/2020    2:15 PM 06/29/2019   10:13 AM 06/25/2018    9:54 AM 06/24/2017   11:14 AM 11/29/2016    3:13 PM  Advanced Directives  Does Patient Have a Medical Advance Directive? Yes Yes Yes Yes Yes Yes No  Type of AParamedicof AHohenwaldLiving will HMarionLiving will HRuhenstrothLiving will Healthcare Power of ABurkeLiving will HPayetteLiving will   Does patient want to make changes to medical advance directive? No - Patient declined No - Patient declined No - Patient declined No - Patient declined No - Patient declined No - Patient declined   Copy of HStrumin Chart? No - copy requested No - copy requested No - copy requested No - copy requested No - copy requested No - copy requested     Current Medications (verified) No outpatient encounter medications on file as of 08/09/2022.   No facility-administered encounter medications on file as of 08/09/2022.    Allergies (verified) Excedrin extra strength [asa-apap-caff buffered]   History: Past Medical History:  Diagnosis Date   [place]/08[place]   Overview:  No chemo/XRT 2/2 localized lesion.  Unable to tolerate Tamoxifen.   Breast cancer ([facility] 2014[place]2015   left breast cancer   Chickenpox    Primary cancer of upper outer quadrant of right female breast  [place] 05/06/2016   Past Surgical History:  Procedure Laterality Date   ABDOMINAL HYSTERECTOMY     APPENDECTOMY     BREAST BIOPSY Left    Stereotactic biopsy   BREAST BIOPSY Right 05/02/2016   [place] biopsy   MASTECTOMY Left 2015   MASTECTOMY W/ SENTINEL NODE BIOPSY Right 05/24/2016   Procedure: MASTECTOMY WITH SENTINEL LYMPH NODE BIOPSY;  Surgeon: CHubbard Robinson MD;  Location: [facility] ORS;  Service: General;  Laterality: Right;   Family History  Problem Relation Age of Onset   Heart disease Mother    Prostate cancer Father    Stroke Brother    Healthy Daughter    Social History   Socioeconomic History   Marital status: Widowed    Spouse name: Not on file   Number of children: Not on file   Years of education: Not on file   Highest education level: Not on file  Occupational History   Not on file  Tobacco Use   Smoking status: Former    Types: Cigarettes    Quit date: 01/09/1971    Years since quitting: 51.6   Smokeless tobacco: Never  Vaping Use   Vaping Use: Never used  Substance and Sexual Activity   Alcohol use: Yes    Comment: Occasional   Drug use: No   Sexual activity: Never  Other Topics Concern   Not on file  Social History Narrative  Works 2-3 days a week at [employer].   Social Determinants of Health   Financial Resource Strain: Low Risk  (08/09/2022)   Overall Financial Resource Strain (CARDIA)    Difficulty of Paying Living Expenses: Not hard at all  Food Insecurity: No Food Insecurity (08/09/2022)   Hunger Vital Sign    Worried About Running Out of Food in the Last Year: Never true    Ran Out of Food in the Last Year: Never true  Transportation Needs: No Transportation Needs (08/09/2022)   PRAPARE - Hydrologist (Medical): No    Lack of Transportation (Non-Medical): No  Physical Activity: Not on file  Stress: No Stress Concern Present (08/09/2022)   [place]    Feeling of Stress : Not at all  Social Connections: Unknown (08/09/2022)   Social Connection and Isolation Panel [NHANES]    Frequency of Communication with Friends and Family: More than three times a week    Frequency of Social Gatherings with Friends and Family: More than three times a week    Attends Religious Services: Not on Advertising copywriter or Organizations: Not on file    Attends Archivist Meetings: Not on file    Marital Status: Not on file    Tobacco Counseling Counseling given: Not Answered   Clinical Intake:  Pre-visit preparation completed: Yes        Diabetes: No  How often do you need to have someone help you when you read instructions, pamphlets, or other written materials from your doctor or pharmacy?: 1 - Never    Interpreter Needed?: No      Activities of Daily Living    08/09/2022   10:10 AM  In your present state of health, do you have any difficulty performing the following activities:  Hearing? 1  Comment Hearing aid  Vision? 0  Difficulty concentrating or making decisions? 0  Walking or climbing stairs? 0  Dressing or bathing? 0  Doing errands, shopping? 0  Preparing Food and eating ? N  Using the Toilet? N  In the past six months, have you accidently leaked urine? N  Do you have problems with loss of bowel control? N  Managing your Medications? N  Managing your Finances? N  Housekeeping or managing your Housekeeping? N    Patient Care Team: Leone Haven, MD as PCP - General (Family Medicine)  Indicate any recent Medical Services you may have received from other than [facility] providers in the past year (date may be approximate).     Assessment:   This is a routine wellness examination for Kristin Valdez.  I connected with  Kristin Valdez on 08/09/22 by a audio enabled telemedicine application and verified that I am speaking with the correct person using two identifiers.  Patient Location:  Home  Provider Location: Office/Clinic  I discussed the limitations of evaluation and management by telemedicine. The patient expressed understanding and agreed to proceed.   Hearing/Vision screen Hearing Screening - Comments:: Hearing aid, bilateral  Vision Screening - Comments:: Followed by Dr. Ellin Mayhew  Wears clear contact lenses R eye to prevent lashes from growing inward.    Dietary issues and exercise activities discussed: Current Exercise Habits: Home exercise routine, Intensity: Mild   Goals Addressed             This Visit's Progress    Maintain Healthy Lifestyle   On track  Stay active Exercise        Depression Screen    08/09/2022   10:13 AM 07/04/2022   10:26 AM 08/04/2021    1:25 PM 06/30/2021    2:11 PM 06/29/2020    8:36 AM 12/28/2019   10:52 AM 06/29/2019   10:13 AM  PHQ 2/9 Scores  PHQ - 2 Score 0 0 0 0 0 0 0    Fall Risk    08/09/2022   10:12 AM 07/04/2022   10:26 AM 08/04/2021    1:24 PM 06/30/2021    2:15 PM 06/29/2020    8:35 AM  Fall Risk   Falls in the past year? 0 0 0 0 0  Number falls in past yr: 0 0 0 0 0  Injury with Fall? 0 0 0    Risk for fall due to :  No Fall Risks No Fall Risks    Follow up Falls evaluation completed;Falls prevention discussed Falls evaluation completed Falls evaluation completed Falls evaluation completed Falls evaluation completed    FALL RISK PREVENTION PERTAINING TO THE HOME: Home free of loose throw rugs in walkways, pet beds, electrical cords, etc? Yes  Adequate lighting in your home to reduce risk of falls? Yes   ASSISTIVE DEVICES UTILIZED TO PREVENT FALLS: Life alert? No  Use of a cane, walker or w/c? No  Grab bars in the bathroom? No  Shower chair or bench in shower? No  Elevated toilet seat or a handicapped toilet? No   TIMED UP AND GO: Was the test performed? No .   Cognitive Function:        08/09/2022   10:10 AM 06/30/2021    2:43 PM 06/29/2019   10:16 AM 06/25/2018    9:56 AM 06/24/2017   11:30 AM   6CIT Screen  What Year? 0 points 0 points 0 points 0 points 0 points  What month? 0 points 0 points 0 points 0 points 0 points  What time? 0 points 0 points 0 points 0 points 0 points  Count back from 20 0 points 0 points 0 points 0 points 0 points  Months in reverse 0 points 0 points 0 points 0 points 0 points  Repeat phrase 0 points 0 points 0 points 0 points 0 points  Total Score 0 points 0 points 0 points 0 points 0 points    Immunizations Immunization History  Administered Date(s) Administered   Tdap 10/29/2013   Screening Tests Health Maintenance  Topic Date Due   INFLUENZA VACCINE  08/26/2022 (Originally 12/26/2021)   Medicare Annual Wellness (AWV)  08/09/2023   DTaP/Tdap/Td (2 - Td or Tdap) 10/30/2023   DEXA SCAN  Completed   HPV VACCINES  Aged Out   Pneumonia Vaccine 71+ Years old  Discontinued   COVID-19 Vaccine  Discontinued   Zoster Vaccines- Shingrix  Discontinued   Health Maintenance There are no preventive care reminders to display for this patient.  Lung Cancer Screening: (Low Dose CT Chest recommended if Age 36-80 years, 30 pack-year currently smoking OR have quit w/in 15years.) does not qualify.   Hepatitis C Screening: does not qualify.  Vision Screening: Recommended annual ophthalmology exams for early detection of glaucoma and other disorders of the eye.  Dental Screening: Recommended annual dental exams for proper oral hygiene.  Community Resource Referral / Chronic Care Management: CRR required this visit?  No   CCM required this visit?  No      Plan:     I have personally  reviewed and noted the following in the patient's chart:   Medical and social history Use of alcohol, tobacco or illicit drugs  Current medications and supplements including opioid prescriptions. Patient is not currently taking opioid prescriptions. Functional ability and status Nutritional status Physical activity Advanced directives List of other  physicians Hospitalizations, surgeries, and ER visits in previous 12 months Vitals Screenings to include cognitive, depression, and falls Referrals and appointments  In addition, I have reviewed and discussed with patient certain preventive protocols, quality metrics, and best practice recommendations. A written personalized care plan for preventive services as well as general preventive health recommendations were provided to patient.     Leta Jungling, LPN   [phone]

## 2022-09-03 ENCOUNTER — Ambulatory Visit (INDEPENDENT_AMBULATORY_CARE_PROVIDER_SITE_OTHER): Payer: Medicare HMO

## 2022-09-03 DIAGNOSIS — E538 Deficiency of other specified B group vitamins: Secondary | ICD-10-CM

## 2022-09-03 MED ORDER — CYANOCOBALAMIN 1000 MCG/ML IJ SOLN
1000.0000 ug | Freq: Once | INTRAMUSCULAR | Status: AC
  Administered 2022-09-03: 1000 ug via INTRAMUSCULAR

## 2022-09-03 NOTE — Progress Notes (Signed)
Pt presented for their vitamin B12 injection. Pt was identified through two identifiers. Pt tolerated shot well in their left hip

## 2022-09-24 ENCOUNTER — Other Ambulatory Visit (INDEPENDENT_AMBULATORY_CARE_PROVIDER_SITE_OTHER): Payer: Medicare HMO

## 2022-09-24 DIAGNOSIS — E559 Vitamin D deficiency, unspecified: Secondary | ICD-10-CM | POA: Diagnosis not present

## 2022-09-24 LAB — VITAMIN D 25 HYDROXY (VIT D DEFICIENCY, FRACTURES): VITD: 32.79 ng/mL (ref 30.00–100.00)

## 2022-10-04 ENCOUNTER — Ambulatory Visit (INDEPENDENT_AMBULATORY_CARE_PROVIDER_SITE_OTHER): Payer: Medicare HMO

## 2022-10-04 DIAGNOSIS — E538 Deficiency of other specified B group vitamins: Secondary | ICD-10-CM | POA: Diagnosis not present

## 2022-10-04 MED ORDER — CYANOCOBALAMIN 1000 MCG/ML IJ SOLN
1000.0000 ug | Freq: Once | INTRAMUSCULAR | Status: AC
  Administered 2022-10-04: 1000 ug via INTRAMUSCULAR

## 2022-10-04 NOTE — Progress Notes (Signed)
Patient presented for B 12 injection to right ventrogluteal, patient voiced no concerns nor showed any signs of distress during injection

## 2022-11-05 ENCOUNTER — Ambulatory Visit: Payer: Medicare HMO

## 2022-11-06 ENCOUNTER — Ambulatory Visit (INDEPENDENT_AMBULATORY_CARE_PROVIDER_SITE_OTHER): Payer: Medicare HMO

## 2022-11-06 DIAGNOSIS — E538 Deficiency of other specified B group vitamins: Secondary | ICD-10-CM

## 2022-11-06 MED ORDER — CYANOCOBALAMIN 1000 MCG/ML IJ SOLN
1000.0000 ug | Freq: Once | INTRAMUSCULAR | Status: AC
  Administered 2022-11-06: 1000 ug via INTRAMUSCULAR

## 2022-11-06 NOTE — Progress Notes (Signed)
After obtaining consent, and per orders of Bethanie Dicker, NP, injection of B-12 given IM in vastus lateralis by Valentino Nose. Patient tolerated injection well.

## 2022-11-12 DIAGNOSIS — H16141 Punctate keratitis, right eye: Secondary | ICD-10-CM | POA: Diagnosis not present

## 2022-11-12 DIAGNOSIS — H524 Presbyopia: Secondary | ICD-10-CM | POA: Diagnosis not present

## 2022-11-12 DIAGNOSIS — H04123 Dry eye syndrome of bilateral lacrimal glands: Secondary | ICD-10-CM | POA: Diagnosis not present

## 2022-11-12 DIAGNOSIS — H02059 Trichiasis without entropian unspecified eye, unspecified eyelid: Secondary | ICD-10-CM | POA: Diagnosis not present

## 2022-11-12 DIAGNOSIS — H40023 Open angle with borderline findings, high risk, bilateral: Secondary | ICD-10-CM | POA: Diagnosis not present

## 2022-11-12 DIAGNOSIS — Z961 Presence of intraocular lens: Secondary | ICD-10-CM | POA: Diagnosis not present

## 2022-11-12 DIAGNOSIS — Z0100 Encounter for examination of eyes and vision without abnormal findings: Secondary | ICD-10-CM | POA: Diagnosis not present

## 2022-12-06 ENCOUNTER — Ambulatory Visit: Payer: Medicare HMO

## 2022-12-10 ENCOUNTER — Ambulatory Visit: Payer: Medicare HMO | Admitting: *Deleted

## 2022-12-10 DIAGNOSIS — E538 Deficiency of other specified B group vitamins: Secondary | ICD-10-CM | POA: Diagnosis not present

## 2022-12-10 MED ORDER — CYANOCOBALAMIN 1000 MCG/ML IJ SOLN
1000.0000 ug | Freq: Once | INTRAMUSCULAR | Status: AC
  Administered 2022-12-10: 1000 ug via INTRAMUSCULAR

## 2022-12-10 NOTE — Progress Notes (Signed)
After obtaining consent, and per orders of Bethanie Dicker, NP, injection of B-12 given IM in Left vastus lateralis.  Patient tolerated injection well.

## 2023-01-10 ENCOUNTER — Ambulatory Visit (INDEPENDENT_AMBULATORY_CARE_PROVIDER_SITE_OTHER): Payer: Medicare HMO

## 2023-01-10 DIAGNOSIS — E559 Vitamin D deficiency, unspecified: Secondary | ICD-10-CM

## 2023-01-10 MED ORDER — CYANOCOBALAMIN 1000 MCG/ML IJ SOLN
1000.0000 ug | Freq: Once | INTRAMUSCULAR | Status: AC
  Administered 2023-01-10: 1000 ug via INTRAMUSCULAR

## 2023-01-10 NOTE — Progress Notes (Signed)
After obtaining consent, and per orders of Dr. Birdie Sons, injection of B12 given by Kristie Cowman in RIGHT VASTUS LATERALIS. Patient tolerated injection well.

## 2023-02-11 ENCOUNTER — Ambulatory Visit (INDEPENDENT_AMBULATORY_CARE_PROVIDER_SITE_OTHER): Payer: Medicare HMO

## 2023-02-11 DIAGNOSIS — E538 Deficiency of other specified B group vitamins: Secondary | ICD-10-CM | POA: Diagnosis not present

## 2023-02-11 MED ORDER — CYANOCOBALAMIN 1000 MCG/ML IJ SOLN
1000.0000 ug | Freq: Once | INTRAMUSCULAR | Status: AC
  Administered 2023-02-11: 1000 ug via INTRAMUSCULAR

## 2023-02-11 NOTE — Progress Notes (Signed)
Pt presented for their vitamin B12 injection. Pt was identified through two identifiers. Pt tolerated shot well in their right hip

## 2023-03-11 ENCOUNTER — Ambulatory Visit (INDEPENDENT_AMBULATORY_CARE_PROVIDER_SITE_OTHER): Payer: Medicare HMO

## 2023-03-11 DIAGNOSIS — E538 Deficiency of other specified B group vitamins: Secondary | ICD-10-CM

## 2023-03-11 MED ORDER — CYANOCOBALAMIN 1000 MCG/ML IJ SOLN
1000.0000 ug | Freq: Once | INTRAMUSCULAR | Status: AC
  Administered 2023-03-11: 1000 ug via INTRAMUSCULAR

## 2023-03-11 NOTE — Progress Notes (Signed)
Patient presented for a B12 injection and it was administered into her left hip. Patient tolerated the injection well.

## 2023-04-11 ENCOUNTER — Ambulatory Visit: Payer: Medicare HMO

## 2023-04-11 DIAGNOSIS — E538 Deficiency of other specified B group vitamins: Secondary | ICD-10-CM | POA: Diagnosis not present

## 2023-04-11 MED ORDER — CYANOCOBALAMIN 1000 MCG/ML IJ SOLN
1000.0000 ug | Freq: Once | INTRAMUSCULAR | Status: AC
  Administered 2023-04-11: 1000 ug via INTRAMUSCULAR

## 2023-04-11 NOTE — Progress Notes (Signed)
Patient presented for B 12 injection to right ventrogluteal, patient voiced no concerns nor showed any signs of distress during injection

## 2023-05-13 ENCOUNTER — Ambulatory Visit (INDEPENDENT_AMBULATORY_CARE_PROVIDER_SITE_OTHER): Payer: Medicare HMO

## 2023-05-13 DIAGNOSIS — E538 Deficiency of other specified B group vitamins: Secondary | ICD-10-CM | POA: Diagnosis not present

## 2023-05-13 MED ORDER — CYANOCOBALAMIN 1000 MCG/ML IJ SOLN
1000.0000 ug | Freq: Once | INTRAMUSCULAR | Status: AC
  Administered 2023-05-13: 1000 ug via INTRAMUSCULAR

## 2023-05-13 NOTE — Progress Notes (Signed)
Pt presented for their vitamin B12 injection. Pt was identified through two identifiers. Pt tolerated shot well in their right ventrogluteal.

## 2023-06-17 ENCOUNTER — Ambulatory Visit: Payer: Medicare HMO

## 2023-06-20 ENCOUNTER — Ambulatory Visit (INDEPENDENT_AMBULATORY_CARE_PROVIDER_SITE_OTHER): Payer: Medicare HMO

## 2023-06-20 DIAGNOSIS — E538 Deficiency of other specified B group vitamins: Secondary | ICD-10-CM | POA: Diagnosis not present

## 2023-06-20 MED ORDER — CYANOCOBALAMIN 1000 MCG/ML IJ SOLN: 1000.0000 ug | Freq: Once | INTRAMUSCULAR | Status: DC

## 2023-06-20 MED ORDER — CYANOCOBALAMIN 1000 MCG/ML IJ SOLN
1000.0000 ug | Freq: Once | INTRAMUSCULAR | Status: AC
  Administered 2023-06-20: 1000 ug via INTRAMUSCULAR

## 2023-06-20 NOTE — Progress Notes (Signed)
Patient presented for B 12 injection to left ventrogluteal, patient voiced no concerns nor showed any signs of distress during injection  

## 2023-07-10 ENCOUNTER — Encounter: Payer: Self-pay | Admitting: Family Medicine

## 2023-07-10 ENCOUNTER — Encounter: Payer: Medicare HMO | Admitting: Family Medicine

## 2023-07-10 ENCOUNTER — Ambulatory Visit (INDEPENDENT_AMBULATORY_CARE_PROVIDER_SITE_OTHER): Payer: Medicare HMO | Admitting: Family Medicine

## 2023-07-10 VITALS — BP 132/68 | HR 83 | Temp 98.0°F | Resp 18 | Ht 64.0 in | Wt 166.1 lb

## 2023-07-10 DIAGNOSIS — R7309 Other abnormal glucose: Secondary | ICD-10-CM | POA: Diagnosis not present

## 2023-07-10 DIAGNOSIS — N393 Stress incontinence (female) (male): Secondary | ICD-10-CM

## 2023-07-10 DIAGNOSIS — E538 Deficiency of other specified B group vitamins: Secondary | ICD-10-CM | POA: Diagnosis not present

## 2023-07-10 DIAGNOSIS — E785 Hyperlipidemia, unspecified: Secondary | ICD-10-CM

## 2023-07-10 DIAGNOSIS — E559 Vitamin D deficiency, unspecified: Secondary | ICD-10-CM

## 2023-07-10 DIAGNOSIS — M1990 Unspecified osteoarthritis, unspecified site: Secondary | ICD-10-CM | POA: Diagnosis not present

## 2023-07-10 LAB — LIPID PANEL
Cholesterol: 219 mg/dL — ABNORMAL HIGH (ref 0–200)
HDL: 62.6 mg/dL (ref >39.00)
LDL Cholesterol: 131 mg/dL — ABNORMAL HIGH (ref 0–99)
NonHDL: 156.47
Total CHOL/HDL Ratio: 3
Triglycerides: 129 mg/dL (ref 0.0–149.0)
VLDL: 25.8 mg/dL (ref 0.0–40.0)

## 2023-07-10 LAB — COMPREHENSIVE METABOLIC PANEL
ALT: 13 U/L (ref 0–35)
AST: 16 U/L (ref 0–37)
Albumin: 4.3 g/dL (ref 3.5–5.2)
Alkaline Phosphatase: 84 U/L (ref 39–117)
BUN: 13 mg/dL (ref 6–23)
CO2: 27 meq/L (ref 19–32)
Calcium: 9.4 mg/dL (ref 8.4–10.5)
Chloride: 105 meq/L (ref 96–112)
Creatinine, Ser: 0.82 mg/dL (ref 0.40–1.20)
GFR: 65.12 mL/min (ref >60.00)
Glucose, Bld: 101 mg/dL — ABNORMAL HIGH (ref 70–99)
Potassium: 5.1 meq/L (ref 3.5–5.1)
Sodium: 140 meq/L (ref 135–145)
Total Bilirubin: 0.5 mg/dL (ref 0.2–1.2)
Total Protein: 7.8 g/dL (ref 6.0–8.3)

## 2023-07-10 LAB — VITAMIN D 25 HYDROXY (VIT D DEFICIENCY, FRACTURES): VITD: 52.91 ng/mL (ref 30.00–100.00)

## 2023-07-10 LAB — VITAMIN B12: Vitamin B-12: 560 pg/mL (ref 211–911)

## 2023-07-10 LAB — HEMOGLOBIN A1C: Hgb A1c MFr Bld: 6.4 % (ref 4.6–6.5)

## 2023-07-10 NOTE — Assessment & Plan Note (Deleted)
Reports receiving monthly B12 injections and believes it helps with energy levels. -Check Vitamin B12 levels today. -Continue monthly B12 injections as per patient preference.

## 2023-07-10 NOTE — Assessment & Plan Note (Signed)
Reports occasional incontinence when coughing or sneezing. No current management plan in place. -Start Kegel exercises to strengthen pelvic floor muscles. -Consider pelvic floor therapy if symptoms worsen.

## 2023-07-10 NOTE — Assessment & Plan Note (Signed)
Reports receiving monthly B12 injections and believes it helps with energy levels. -Check Vitamin B12 levels today. -Continue monthly B12 injections as per patient preference.

## 2023-07-10 NOTE — Progress Notes (Signed)
SUBJECTIVE:   Chief Complaint  Patient presents with   Establish Care    Transferring from Dr. Birdie Sons   HPI Presents to clinic to transfer care   Discussed the use of AI scribe software for clinical note transcription with the patient, who gave verbal consent to proceed.  History of Present Illness Kristin Valdez is an 88 year old female who presents for a routine follow-up visit. She is transferring from Dr. Birdie Sons.  She has not been on any medications recently and does not monitor her blood pressure at home. Her blood pressure was initially measured at 148/70, but a repeat measurement showed it decreased to 132. She has not had any consistently high readings in the past.  She attributes arthritis in her knee and hands to her quilting activities, which she engages in for 5-6 hours a day. She takes ibuprofen, typically 400 mg, as needed for knee pain but not daily, and is cautious about medication use due to concerns about liver and kidney health.  She experiences occasional stress incontinence, particularly when coughing or sneezing, but it is not a constant issue. No chest pain, shortness of breath, constipation, diarrhea, or blood in stool.  Her surgical history includes a hysterectomy and double mastectomy, which she believes has affected her hormone levels. She experiences occasional feelings of tight skin, relieved by taking Tylenol or ibuprofen, and occasional headaches that resolve on their own.  She lives alone in a three-bedroom house, drives herself, and has a daughter who provides great support. She enjoys quilting and ensures she stays active by walking frequently and setting up her quilting space to encourage movement.      PERTINENT PMH / PSH: As above  OBJECTIVE:  BP 132/68   Pulse 83   Temp 98 F (36.7 C)   Resp 18   Ht 5\' 4"  (1.626 m)   Wt 166 lb 2 oz (75.4 kg)   SpO2 95%   BMI 28.52 kg/m    Physical Exam Vitals reviewed.  Constitutional:       General: She is not in acute distress.    Appearance: She is not ill-appearing.  HENT:     Head: Normocephalic.     Right Ear: Tympanic membrane, ear canal and external ear normal.     Left Ear: Tympanic membrane, ear canal and external ear normal.     Nose: Nose normal.     Mouth/Throat:     Mouth: Mucous membranes are moist.  Eyes:     Extraocular Movements: Extraocular movements intact.     Conjunctiva/sclera: Conjunctivae normal.     Pupils: Pupils are equal, round, and reactive to light.  Neck:     Thyroid: No thyromegaly or thyroid tenderness.     Vascular: No carotid bruit.  Cardiovascular:     Rate and Rhythm: Normal rate and regular rhythm.     Pulses: Normal pulses.     Heart sounds: Normal heart sounds.  Pulmonary:     Effort: Pulmonary effort is normal.     Breath sounds: Normal breath sounds.  Abdominal:     General: Bowel sounds are normal. There is no distension.     Palpations: Abdomen is soft.     Tenderness: There is no abdominal tenderness. There is no right CVA tenderness, left CVA tenderness, guarding or rebound.  Musculoskeletal:        General: Normal range of motion.     Cervical back: Normal range of motion.     Right  lower leg: No edema.     Left lower leg: No edema.  Lymphadenopathy:     Cervical: No cervical adenopathy.  Skin:    Capillary Refill: Capillary refill takes less than 2 seconds.  Neurological:     General: No focal deficit present.     Mental Status: She is alert and oriented to person, place, and time. Mental status is at baseline.     Motor: No weakness.  Psychiatric:        Mood and Affect: Mood normal.        Behavior: Behavior normal.        Thought Content: Thought content normal.        Judgment: Judgment normal.           07/10/2023    9:23 AM 08/09/2022   10:13 AM 07/04/2022   10:26 AM 08/04/2021    1:25 PM 06/30/2021    2:11 PM  Depression screen PHQ 2/9  Decreased Interest 0 0 0 0 0  Down, Depressed, Hopeless 1 0 0  0 0  PHQ - 2 Score 1 0 0 0 0  Altered sleeping 0      Tired, decreased energy 0      Change in appetite 0      Feeling bad or failure about yourself  0      Trouble concentrating 0      Moving slowly or fidgety/restless 0      Suicidal thoughts 0      PHQ-9 Score 1      Difficult doing work/chores Not difficult at all          07/10/2023    9:23 AM 07/04/2022   10:26 AM  GAD 7 : Generalized Anxiety Score  Nervous, Anxious, on Edge 0 0  Control/stop worrying 0 0  Worry too much - different things 0 0  Trouble relaxing 0 0  Restless 0 0  Easily annoyed or irritable 0 0  Afraid - awful might happen 0 0  Total GAD 7 Score 0 0  Anxiety Difficulty Not difficult at all Not difficult at all    ASSESSMENT/PLAN:  Hyperlipidemia, unspecified hyperlipidemia type -     Lipid panel -     Comprehensive metabolic panel  Vitamin D deficiency -     VITAMIN D 25 Hydroxy (Vit-D Deficiency, Fractures)  Abnormal glucose -     Hemoglobin A1c  Vitamin B 12 deficiency Assessment & Plan: Reports receiving monthly B12 injections and believes it helps with energy levels. -Check Vitamin B12 levels today. -Continue monthly B12 injections as per patient preference.  Orders: -     Vitamin B12  Stress incontinence Assessment & Plan: Reports occasional incontinence when coughing or sneezing. No current management plan in place. -Start Kegel exercises to strengthen pelvic floor muscles. -Consider pelvic floor therapy if symptoms worsen.   Arthritis Assessment & Plan: Reports of knee and hand pain due to quilting activity. No current medication for pain management. Discussed the use of Tylenol and ibuprofen for pain relief and the benefits of regular movement for arthritis. -Consider daily Tylenol for arthritis pain. -Use ibuprofen for flare-ups. -Continue regular movement and consider adding turmeric to diet for its anti-inflammatory properties.     PDMP reviewed  Return if symptoms  worsen or fail to improve, for PCP.  Dana Allan, MD

## 2023-07-10 NOTE — Assessment & Plan Note (Signed)
Reports of knee and hand pain due to quilting activity. No current medication for pain management. Discussed the use of Tylenol and ibuprofen for pain relief and the benefits of regular movement for arthritis. -Consider daily Tylenol for arthritis pain. -Use ibuprofen for flare-ups. -Continue regular movement and consider adding turmeric to diet for its anti-inflammatory properties.

## 2023-07-10 NOTE — Patient Instructions (Addendum)
It was a pleasure meeting you today. Thank you for allowing me to take part in your health care.  Our goals for today as we discussed include:  We will get some labs today.  If they are abnormal or we need to do something about them, I will call you.  If they are normal, I will send you a message on MyChart (if it is active) or a letter in the mail.  If you don't hear from [place] in 2 weeks, please call the office at the number below.   Can take Tylenol 650 mg two to three times a day for arthritic pain.    This is a list of the screening recommended for you and due dates:  Health Maintenance  Topic Date Due   Medicare Annual Wellness Visit  08/09/2023   Flu Shot  08/26/2023*   DTaP/Tdap/Td vaccine (2 - Td or Tdap) 10/30/2023   DEXA scan (bone density measurement)  Completed   HPV Vaccine  Aged Out   Pneumonia Vaccine  Discontinued   COVID-19 Vaccine  Discontinued   Zoster (Shingles) Vaccine  Discontinued  *Topic was postponed. The date shown is not the original due date.     If you have any questions or concerns, please do not hesitate to call the office at [phone].  I look forward to our next visit and until then take care and stay safe.  Regards,   Dana Allan, MD   [facility]

## 2023-07-22 ENCOUNTER — Encounter: Payer: Self-pay | Admitting: Family Medicine

## 2023-07-22 ENCOUNTER — Ambulatory Visit (INDEPENDENT_AMBULATORY_CARE_PROVIDER_SITE_OTHER): Payer: Medicare HMO | Admitting: Family Medicine

## 2023-07-22 ENCOUNTER — Ambulatory Visit: Payer: Medicare HMO

## 2023-07-22 VITALS — BP 134/86 | HR 85 | Temp 98.2°F | Wt 166.8 lb

## 2023-07-22 DIAGNOSIS — J069 Acute upper respiratory infection, unspecified: Secondary | ICD-10-CM | POA: Insufficient documentation

## 2023-07-22 DIAGNOSIS — E538 Deficiency of other specified B group vitamins: Secondary | ICD-10-CM

## 2023-07-22 MED ORDER — CYANOCOBALAMIN 1000 MCG/ML IJ SOLN
1000.0000 ug | Freq: Once | INTRAMUSCULAR | Status: AC
  Administered 2023-07-22: 1000 ug via INTRAMUSCULAR

## 2023-07-22 MED ORDER — AMOXICILLIN-POT CLAVULANATE 875-125 MG PO TABS: 1.0000 | ORAL_TABLET | Freq: Two times a day (BID) | ORAL | 0 refills | Status: DC

## 2023-07-22 NOTE — Progress Notes (Deleted)
 Pt presented for their vitamin B12 injection. Pt was identified through two identifiers. Pt tolerated shot well in their left or right deltoid.

## 2023-07-22 NOTE — Assessment & Plan Note (Signed)
 Suspect viral upper respiratory infection based on symptoms and recent improvement.  Discussed a trial of Claritin and/or Flonase over-the-counter to help with symptoms.  Discussed at this point I do not think she requires an antibiotic though if her symptoms do not continue to improve over the next several days she could start Augmentin 1 tablet twice daily for a week.  Advised not to pick this up unless her symptoms are not improving further.

## 2023-07-22 NOTE — Addendum Note (Signed)
 Addended by: Kristie Cowman on: 07/22/2023 11:55 AM   Modules accepted: Orders

## 2023-07-22 NOTE — Progress Notes (Signed)
  Marikay Alar, MD Phone: [phone]  Kristin Valdez is a 88 y.o. female who presents today for same-day visit.  Congestion: Patient with sinus congestion and some drainage into her chest over the last week.  She is blowing clear mucus out of her nose.  Has a dry cough.  No sore throat.  No fever.  No shortness of breath.  No sick contacts.  Feels as though she is improving some.  Social History   Tobacco Use  Smoking Status Former   Current packs/day: 0.00   Types: Cigarettes   Quit date: 01/09/1971   Years since quitting: 52.5  Smokeless Tobacco Never    Current Outpatient Medications on File Prior to Visit  Medication Sig Dispense Refill   diclofenac Sodium (VOLTAREN) 1 % GEL Apply topically as needed.     No current facility-administered medications on file prior to visit.     ROS see history of present illness  Objective  Physical Exam Vitals:   07/22/23 1132  BP: 134/86  Pulse: 85  Temp: 98.2 F (36.8 C)  SpO2: 98%    BP Readings from Last 3 Encounters:  07/22/23 134/86  07/10/23 132/68  07/04/22 120/78   Wt Readings from Last 3 Encounters:  07/22/23 166 lb 12.8 oz (75.7 kg)  07/10/23 166 lb 2 oz (75.4 kg)  08/09/22 172 lb (78 kg)    Physical Exam Constitutional:      General: She is not in acute distress.    Appearance: She is not diaphoretic.  HENT:     Mouth/Throat:     Mouth: Mucous membranes are moist.     Pharynx: Oropharynx is clear.  Cardiovascular:     Rate and Rhythm: Normal rate and regular rhythm.     Heart sounds: Normal heart sounds.  Pulmonary:     Effort: Pulmonary effort is normal.     Breath sounds: Normal breath sounds.  Skin:    General: Skin is warm and dry.  Neurological:     Mental Status: She is alert.      Assessment/Plan: Please see individual problem list.  Viral upper respiratory infection Assessment & Plan: Suspect viral upper respiratory infection based on symptoms and recent improvement.  Discussed a  trial of Claritin and/or Flonase over-the-counter to help with symptoms.  Discussed at this point I do not think she requires an antibiotic though if her symptoms do not continue to improve over the next several days she could start Augmentin 1 tablet twice daily for a week.  Advised not to pick this up unless her symptoms are not improving further.  Orders: -     Amoxicillin-Pot Clavulanate; Take 1 tablet by mouth 2 (two) times daily.  Dispense: 14 tablet; Refill: 0     Return if symptoms worsen or fail to improve.   Marikay Alar, MD [facility]

## 2023-07-22 NOTE — Patient Instructions (Signed)
 Nice to see you. Please start the Claritin and/or Flonase to see if that will help with your symptoms. If your symptoms do not continue to improve over the next couple of days you can start on the Augmentin.

## 2023-07-22 NOTE — Progress Notes (Signed)
 Pt presented for their vitamin B12 injection. Pt was identified through two identifiers. Pt tolerated shot well in their left gluteus

## 2023-08-09 ENCOUNTER — Ambulatory Visit: Payer: Medicare HMO | Admitting: *Deleted

## 2023-08-09 VITALS — Ht 64.0 in | Wt 166.0 lb

## 2023-08-09 DIAGNOSIS — Z0000 Encounter for general adult medical examination without abnormal findings: Secondary | ICD-10-CM

## 2023-08-09 NOTE — Progress Notes (Signed)
 Subjective:   Kristin Valdez is a 88 y.o. who presents for a Medicare Wellness preventive visit.  Visit Complete: Virtual I connected with  ANTANIA HOEFLING on 08/09/23 by a audio enabled telemedicine application and verified that I am speaking with the correct person using two identifiers.  Patient Location: Home  Provider Location: Home Office  I discussed the limitations of evaluation and management by telemedicine. The patient expressed understanding and agreed to proceed.  Vital Signs: Because this visit was a virtual/telehealth visit, some criteria may be missing or patient reported. Any vitals not documented were not able to be obtained and vitals that have been documented are patient reported.  VideoDeclined- This patient declined Librarian, academic. Therefore the visit was completed with audio only.  Persons Participating in Visit: Patient.  AWV Questionnaire: No: Patient Medicare AWV questionnaire was not completed prior to this visit.  Cardiac Risk Factors include: advanced age (>61men, >51 women);dyslipidemia     Objective:    Today's Vitals   08/09/23 0932  Weight: 166 lb (75.3 kg)  Height: 5\' 4"  (1.626 m)   Body mass index is 28.49 kg/m.     08/09/2023    9:50 AM 08/09/2022   10:08 AM 06/30/2021    2:12 PM 06/29/2020    2:15 PM 06/29/2019   10:13 AM 06/25/2018    9:54 AM 06/24/2017   11:14 AM  Advanced Directives  Does Patient Have a Medical Advance Directive? Yes Yes Yes Yes Yes Yes Yes  Type of Estate agent of [place];Living will Healthcare Power of [place];Living will Healthcare Power of [place];Living will Healthcare Power of [place];Living will Healthcare Power of [employer] of [place];Living will Healthcare Power of [place];Living will  Does patient want to make changes to medical advance directive?  No - Patient declined No - Patient declined No - Patient declined No - Patient declined No -  Patient declined No - Patient declined  Copy of Healthcare Power of Attorney in Chart? No - copy requested No - copy requested No - copy requested No - copy requested No - copy requested No - copy requested No - copy requested    Current Medications (verified) Outpatient Encounter Medications as of 08/09/2023  Medication Sig   diclofenac Sodium (VOLTAREN) 1 % GEL Apply topically as needed.   ibuprofen (ADVIL) 200 MG tablet Take 200 mg by mouth every 6 (six) hours as needed.   VITAMIN D, CHOLECALCIFEROL, PO Take 5,000 Units by mouth daily.   [DISCONTINUED] amoxicillin-clavulanate (AUGMENTIN) 875-125 MG tablet Take 1 tablet by mouth 2 (two) times daily. (Patient not taking: Reported on 08/09/2023)   No facility-administered encounter medications on file as of 08/09/2023.    Allergies (verified) Amoxicillin and Excedrin extra strength [asa-apap-caff buffered]   History: Past Medical History:  Diagnosis Date   Adenocarcinoma of breast (HCC) 11/08/2016   Overview:  No chemo/XRT 2/2 localized lesion.  Unable to tolerate Tamoxifen.   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   Past Surgical History:  Procedure Laterality Date   ABDOMINAL HYSTERECTOMY     APPENDECTOMY     BREAST BIOPSY Left    Stereotactic biopsy   BREAST BIOPSY Right 05/02/2016   US guided biopsy   MASTECTOMY Left 2015   MASTECTOMY W/ SENTINEL NODE BIOPSY Right 05/24/2016   Procedure: MASTECTOMY WITH SENTINEL LYMPH NODE BIOPSY;  Surgeon: Gladis Riffle, MD;  Location: [facility] ORS;  Service: General;  Laterality: Right;   Family History  Problem Relation Age of Onset   Heart disease Mother    Prostate cancer Father    Stroke Brother    Healthy Daughter    Social History   Socioeconomic History   Marital status: Widowed    Spouse name: Not on file   Number of children: Not on file   Years of education: Not  on file   Highest education level: Not on file  Occupational History   Not on file  Tobacco Use   Smoking status: Former    Current packs/day: 0.00    Types: Cigarettes    Quit date: 01/09/1971    Years since quitting: 52.6   Smokeless tobacco: Never  Vaping Use   Vaping status: Never Used  Substance and Sexual Activity   Alcohol use: Yes    Comment: Occasional   Drug use: No   Sexual activity: Never  Other Topics Concern   Not on file  Social History Narrative   Works 2-3 days a week at [employer].   Social Drivers of Corporate investment banker Strain: Low Risk  (08/09/2023)   Overall Financial Resource Strain (CARDIA)    Difficulty of Paying Living Expenses: Not hard at all  Food Insecurity: No Food Insecurity (08/09/2023)   Hunger Vital Sign    Worried About Running Out of Food in the Last Year: Never true    Ran Out of Food in the Last Year: Never true  Transportation Needs: No Transportation Needs (08/09/2023)   PRAPARE - Administrator, Civil Service (Medical): No    Lack of Transportation (Non-Medical): No  Physical Activity: Inactive (08/09/2023)   Exercise Vital Sign    Days of Exercise per Week: 0 days    Minutes of Exercise per Session: 0 min  Stress: No Stress Concern Present (08/09/2023)   [employer] of Occupational Health - Occupational Stress Questionnaire    Feeling of Stress : Only a little  Social Connections: Moderately Integrated (08/09/2023)   Social Connection and Isolation Panel [NHANES]    Frequency of Communication with Friends and Family: More than three times a week    Frequency of Social Gatherings with Friends and Family: Once a week    Attends Religious Services: More than 4 times per year    Active Member of [employer] or Organizations: Yes    Attends Banker Meetings: More than 4 times per year    Marital Status: Widowed    Tobacco Counseling Counseling given: Not Answered    Clinical  Intake:  Pre-visit preparation completed: Yes  Pain : No/denies pain     BMI - recorded: 28.49 Nutritional Status: BMI 25 -29 Overweight Nutritional Risks: None Diabetes: No  How often do you need to have someone help you when you read instructions, pamphlets, or other written materials from your doctor or pharmacy?: 1 - Never  Interpreter Needed?: No  Information entered by :: R. Jorrell Kuster LPN   Activities of Daily Living     08/09/2023    9:35 AM  In your present state of health, do you have any difficulty performing the following activities:  Hearing? 1  Comment wears aid  Vision? 0  Difficulty concentrating or making decisions? 0  Walking or climbing stairs? 1  Comment bad knee  Dressing or bathing? 0  Doing errands, shopping? 0  Preparing Food and eating ?  N  Using the Toilet? N  In the past six months, have you accidently leaked urine? N  Do you have problems with loss of bowel control? N  Managing your Medications? N  Managing your Finances? N  Housekeeping or managing your Housekeeping? N    Patient Care Team: Glori Luis, MD (Inactive) as PCP - General (Family Medicine)  Indicate any recent Medical Services you may have received from other than [facility] providers in the past year (date may be approximate).     Assessment:   This is a routine wellness examination for Kristin Valdez.  Hearing/Vision screen Hearing Screening - Comments:: Wears aid Vision Screening - Comments:: No glasses   Goals Addressed             This Visit's Progress    Patient Stated       Wants to continue to be able to make quilts       Depression Screen     08/09/2023    9:42 AM 07/10/2023    9:23 AM 08/09/2022   10:13 AM 07/04/2022   10:26 AM 08/04/2021    1:25 PM 06/30/2021    2:11 PM 06/29/2020    8:36 AM  PHQ 2/9 Scores  PHQ - 2 Score 3 1 0 0 0 0 0  PHQ- 9 Score 3 1         Fall Risk     08/09/2023    9:36 AM 07/10/2023    9:23 AM 08/09/2022   10:12 AM 07/04/2022   10:26  AM 08/04/2021    1:24 PM  Fall Risk   Falls in the past year? 0 0 0 0 0  Number falls in past yr: 0 0 0 0 0  Injury with Fall? 0 0 0 0 0  Risk for fall due to : No Fall Risks No Fall Risks  No Fall Risks No Fall Risks  Follow up Falls prevention discussed;Falls evaluation completed Falls evaluation completed;Education provided Falls evaluation completed;Falls prevention discussed Falls evaluation completed Falls evaluation completed    MEDICARE RISK AT HOME:  Medicare Risk at Home Any stairs in or around the home?: Yes If so, are there any without handrails?: Yes Home free of loose throw rugs in walkways, pet beds, electrical cords, etc?: Yes Adequate lighting in your home to reduce risk of falls?: Yes Life alert?: No Use of a cane, walker or w/c?: No Grab bars in the bathroom?: No Shower chair or bench in shower?: No Elevated toilet seat or a handicapped toilet?: No  TIMED UP AND GO:  Was the test performed?  No  Cognitive Function: 6CIT completed        08/09/2022   10:10 AM 06/30/2021    2:43 PM 06/29/2019   10:16 AM 06/25/2018    9:56 AM 06/24/2017   11:30 AM  6CIT Screen  What Year? 0 points 0 points 0 points 0 points 0 points  What month? 0 points 0 points 0 points 0 points 0 points  What time? 0 points 0 points 0 points 0 points 0 points  Count back from 20 0 points 0 points 0 points 0 points 0 points  Months in reverse 0 points 0 points 0 points 0 points 0 points  Repeat phrase 0 points 0 points 0 points 0 points 0 points  Total Score 0 points 0 points 0 points 0 points 0 points    Immunizations Immunization History  Administered Date(s) Administered   Tdap 10/29/2013  Screening Tests Health Maintenance  Topic Date Due   Medicare Annual Wellness (AWV)  08/09/2023   INFLUENZA VACCINE  08/26/2023 (Originally 12/27/2022)   DTaP/Tdap/Td (2 - Td or Tdap) 10/30/2023   DEXA SCAN  Completed   HPV VACCINES  Aged Out   Pneumonia Vaccine 71+ Years old  Discontinued    COVID-19 Vaccine  Discontinued   Zoster Vaccines- Shingrix  Discontinued    Health Maintenance  Health Maintenance Due  Topic Date Due   Medicare Annual Wellness (AWV)  08/09/2023   Health Maintenance Items Addressed: Patient declines all vaccines.   Additional Screening:  Vision Screening: Recommended annual ophthalmology exams for early detection of glaucoma and other disorders of the eye. Up to date Rexanne Mano  Dental Screening: Recommended annual dental exams for proper oral hygiene  Community Resource Referral / Chronic Care Management: CRR required this visit?  No   CCM required this visit?  No     Plan:     I have personally reviewed and noted the following in the patient's chart:   Medical and social history Use of alcohol, tobacco or illicit drugs  Current medications and supplements including opioid prescriptions. Patient is not currently taking opioid prescriptions. Functional ability and status Nutritional status Physical activity Advanced directives List of other physicians Hospitalizations, surgeries, and ER visits in previous 12 months Vitals Screenings to include cognitive, depression, and falls Referrals and appointments  In addition, I have reviewed and discussed with patient certain preventive protocols, quality metrics, and best practice recommendations. A written personalized care plan for preventive services as well as general preventive health recommendations were provided to patient.     Sydell Axon, LPN   0/27/2536   After Visit Summary: (Declined) Due to this being a telephonic visit, with patients personalized plan was offered to patient but patient Declined AVS at this time   Notes: Nothing significant to report at this time.

## 2023-08-09 NOTE — Patient Instructions (Signed)
 Kristin Valdez , Thank you for taking time to come for your Medicare Wellness Visit. I appreciate your ongoing commitment to your health goals. Please review the following plan we discussed and let me know if I can assist you in the future.   Referrals/Orders/Follow-Ups/Clinician Recommendations: None  This is a list of the screening recommended for you and due dates:  Health Maintenance  Topic Date Due   Flu Shot  08/26/2023*   DTaP/Tdap/Td vaccine (2 - Td or Tdap) 10/30/2023   Medicare Annual Wellness Visit  08/08/2024   DEXA scan (bone density measurement)  Completed   HPV Vaccine  Aged Out   Pneumonia Vaccine  Discontinued   COVID-19 Vaccine  Discontinued   Zoster (Shingles) Vaccine  Discontinued  *Topic was postponed. The date shown is not the original due date.    Advanced directives: (Copy Requested) Please bring a copy of your health care power of attorney and living will to the office to be added to your chart at your convenience. You can mail to [facility] [address] [place], [place] [address] or email to [email or web address]  Next Medicare Annual Wellness Visit scheduled for next year: Yes 08/12/24 @ 10:50

## 2023-08-19 ENCOUNTER — Ambulatory Visit (INDEPENDENT_AMBULATORY_CARE_PROVIDER_SITE_OTHER): Payer: Medicare HMO

## 2023-08-19 DIAGNOSIS — E538 Deficiency of other specified B group vitamins: Secondary | ICD-10-CM

## 2023-08-19 MED ORDER — CYANOCOBALAMIN 1000 MCG/ML IJ SOLN
1000.0000 ug | Freq: Once | INTRAMUSCULAR | Status: AC
  Administered 2023-08-19: 1000 ug via INTRAMUSCULAR

## 2023-08-19 NOTE — Progress Notes (Signed)
 Pt presented for their vitamin B12 injection. Pt was identified through two identifiers. Pt tolerated shot well in their right ventrogluteal.

## 2023-08-27 ENCOUNTER — Telehealth: Payer: Self-pay | Admitting: Family Medicine

## 2023-08-27 NOTE — Telephone Encounter (Signed)
 LMOM for this patient to call back and reschedule their TOC visit on 12/09/23 as Dr Charlann Lange will not be in the office that week.   E2C2, please reschedule this patient's TOC visit with Dr Charlann Lange for a later date. [facility]

## 2023-09-23 ENCOUNTER — Ambulatory Visit (INDEPENDENT_AMBULATORY_CARE_PROVIDER_SITE_OTHER)

## 2023-09-23 DIAGNOSIS — E538 Deficiency of other specified B group vitamins: Secondary | ICD-10-CM | POA: Diagnosis not present

## 2023-09-23 MED ORDER — CYANOCOBALAMIN 1000 MCG/ML IJ SOLN
1000.0000 ug | Freq: Once | INTRAMUSCULAR | Status: AC
  Administered 2023-09-23: 1000 ug via INTRAMUSCULAR

## 2023-09-23 NOTE — Progress Notes (Signed)
 Pt presented for their vitamin B12 injection. Pt was identified through two identifiers. Pt tolerated shot well in their right ventrogluteal.

## 2023-10-28 ENCOUNTER — Ambulatory Visit (INDEPENDENT_AMBULATORY_CARE_PROVIDER_SITE_OTHER)

## 2023-10-28 DIAGNOSIS — E538 Deficiency of other specified B group vitamins: Secondary | ICD-10-CM

## 2023-10-28 MED ORDER — CYANOCOBALAMIN 1000 MCG/ML IJ SOLN
1000.0000 ug | Freq: Once | INTRAMUSCULAR | Status: AC
  Administered 2023-10-28: 1000 ug via INTRAMUSCULAR

## 2023-10-28 NOTE — Progress Notes (Signed)
 Pt presented for their vitamin B12 injection. Pt was identified through two identifiers. Pt tolerated shot well in their right ventrogluteal.

## 2023-10-30 ENCOUNTER — Telehealth: Payer: Self-pay

## 2023-10-30 NOTE — Telephone Encounter (Signed)
 Copied from CRM [id number]. Topic: Appointments - Scheduling Inquiry for Clinic >> Oct 30, 2023  8:46 AM Kristin Valdez wrote: Reason for CRM: Patient called in wanting to scheduled for her next B12 shot, please give patient a call to have her scheduled, unable to schedule it  I spoke with patient and scheduled her appointment for her next B12 injection.

## 2023-11-12 DIAGNOSIS — H524 Presbyopia: Secondary | ICD-10-CM | POA: Diagnosis not present

## 2023-11-12 DIAGNOSIS — H16141 Punctate keratitis, right eye: Secondary | ICD-10-CM | POA: Diagnosis not present

## 2023-11-12 DIAGNOSIS — Z0100 Encounter for examination of eyes and vision without abnormal findings: Secondary | ICD-10-CM | POA: Diagnosis not present

## 2023-11-12 DIAGNOSIS — H02059 Trichiasis without entropian unspecified eye, unspecified eyelid: Secondary | ICD-10-CM | POA: Diagnosis not present

## 2023-11-12 DIAGNOSIS — H02052 Trichiasis without entropian right lower eyelid: Secondary | ICD-10-CM | POA: Diagnosis not present

## 2023-11-12 DIAGNOSIS — H40023 Open angle with borderline findings, high risk, bilateral: Secondary | ICD-10-CM | POA: Diagnosis not present

## 2023-11-12 DIAGNOSIS — Z961 Presence of intraocular lens: Secondary | ICD-10-CM | POA: Diagnosis not present

## 2023-11-12 DIAGNOSIS — H04123 Dry eye syndrome of bilateral lacrimal glands: Secondary | ICD-10-CM | POA: Diagnosis not present

## 2023-11-12 DIAGNOSIS — H02051 Trichiasis without entropian right upper eyelid: Secondary | ICD-10-CM | POA: Diagnosis not present

## 2023-11-25 ENCOUNTER — Ambulatory Visit (INDEPENDENT_AMBULATORY_CARE_PROVIDER_SITE_OTHER)

## 2023-11-25 DIAGNOSIS — E538 Deficiency of other specified B group vitamins: Secondary | ICD-10-CM | POA: Diagnosis not present

## 2023-11-25 MED ORDER — CYANOCOBALAMIN 1000 MCG/ML IJ SOLN
1000.0000 ug | Freq: Once | INTRAMUSCULAR | Status: AC
  Administered 2023-11-25: 1000 ug via INTRAMUSCULAR

## 2023-11-25 NOTE — Progress Notes (Signed)
Pt presented for their vitamin B12 injection. Pt was identified through two identifiers. Pt tolerated shot well in their right hip

## 2023-12-09 ENCOUNTER — Encounter

## 2023-12-25 ENCOUNTER — Ambulatory Visit (INDEPENDENT_AMBULATORY_CARE_PROVIDER_SITE_OTHER)

## 2023-12-25 DIAGNOSIS — E538 Deficiency of other specified B group vitamins: Secondary | ICD-10-CM

## 2023-12-25 MED ORDER — CYANOCOBALAMIN 1000 MCG/ML IJ SOLN
1000.0000 ug | Freq: Once | INTRAMUSCULAR | Status: AC
  Administered 2023-12-25: 1000 ug via INTRAMUSCULAR

## 2023-12-25 NOTE — Progress Notes (Signed)
 Patient was administered a B12 injection into her left hip. Patient tolerated the B12 injection well.

## 2024-01-21 ENCOUNTER — Ambulatory Visit

## 2024-01-21 VITALS — BP 145/80 | HR 83 | Ht 65.0 in | Wt 164.6 lb

## 2024-01-21 DIAGNOSIS — G8929 Other chronic pain: Secondary | ICD-10-CM

## 2024-01-21 DIAGNOSIS — M6740 Ganglion, unspecified site: Secondary | ICD-10-CM

## 2024-01-21 DIAGNOSIS — E782 Mixed hyperlipidemia: Secondary | ICD-10-CM | POA: Diagnosis not present

## 2024-01-21 DIAGNOSIS — I10 Essential (primary) hypertension: Secondary | ICD-10-CM

## 2024-01-21 DIAGNOSIS — M8589 Other specified disorders of bone density and structure, multiple sites: Secondary | ICD-10-CM

## 2024-01-21 DIAGNOSIS — E538 Deficiency of other specified B group vitamins: Secondary | ICD-10-CM

## 2024-01-21 DIAGNOSIS — G44219 Episodic tension-type headache, not intractable: Secondary | ICD-10-CM

## 2024-01-21 DIAGNOSIS — M25562 Pain in left knee: Secondary | ICD-10-CM

## 2024-01-21 DIAGNOSIS — R7303 Prediabetes: Secondary | ICD-10-CM

## 2024-01-21 DIAGNOSIS — L989 Disorder of the skin and subcutaneous tissue, unspecified: Secondary | ICD-10-CM

## 2024-01-21 DIAGNOSIS — N393 Stress incontinence (female) (male): Secondary | ICD-10-CM

## 2024-01-21 LAB — CBC WITH DIFFERENTIAL/PLATELET
Basophils Absolute: 0 K/uL (ref 0.0–0.1)
Basophils Relative: 0.7 % (ref 0.0–3.0)
Eosinophils Absolute: 0.1 K/uL (ref 0.0–0.7)
Eosinophils Relative: 1.4 % (ref 0.0–5.0)
HCT: 39.9 % (ref 36.0–46.0)
Hemoglobin: 13 g/dL (ref 12.0–15.0)
Lymphocytes Relative: 35.3 % (ref 12.0–46.0)
Lymphs Abs: 2.2 K/uL (ref 0.7–4.0)
MCHC: 32.5 g/dL (ref 30.0–36.0)
MCV: 91.2 fl (ref 78.0–100.0)
Monocytes Absolute: 0.4 K/uL (ref 0.1–1.0)
Monocytes Relative: 7.3 % (ref 3.0–12.0)
Neutro Abs: 3.4 K/uL (ref 1.4–7.7)
Neutrophils Relative %: 55.3 % (ref 43.0–77.0)
Platelets: 233 K/uL (ref 150.0–400.0)
RBC: 4.37 Mil/uL (ref 3.87–5.11)
RDW: 14.9 % (ref 11.5–15.5)
WBC: 6.1 K/uL (ref 4.0–10.5)

## 2024-01-21 LAB — COMPREHENSIVE METABOLIC PANEL WITH GFR
ALT: 16 U/L (ref 0–35)
AST: 18 U/L (ref 0–37)
Albumin: 4.3 g/dL (ref 3.5–5.2)
Alkaline Phosphatase: 73 U/L (ref 39–117)
BUN: 12 mg/dL (ref 6–23)
CO2: 28 meq/L (ref 19–32)
Calcium: 9.4 mg/dL (ref 8.4–10.5)
Chloride: 103 meq/L (ref 96–112)
Creatinine, Ser: 0.82 mg/dL (ref 0.40–1.20)
GFR: 64.88 mL/min (ref >60.00)
Glucose, Bld: 100 mg/dL — ABNORMAL HIGH (ref 70–99)
Potassium: 4 meq/L (ref 3.5–5.1)
Sodium: 140 meq/L (ref 135–145)
Total Bilirubin: 0.4 mg/dL (ref 0.2–1.2)
Total Protein: 7.7 g/dL (ref 6.0–8.3)

## 2024-01-21 LAB — LIPID PANEL
Cholesterol: 218 mg/dL — ABNORMAL HIGH (ref 0–200)
HDL: 56.4 mg/dL (ref >39.00)
LDL Cholesterol: 125 mg/dL — ABNORMAL HIGH (ref 0–99)
NonHDL: 161.61
Total CHOL/HDL Ratio: 4
Triglycerides: 185 mg/dL — ABNORMAL HIGH (ref 0.0–149.0)
VLDL: 37 mg/dL (ref 0.0–40.0)

## 2024-01-21 LAB — HEMOGLOBIN A1C: Hgb A1c MFr Bld: 6.6 % — ABNORMAL HIGH (ref 4.6–6.5)

## 2024-01-21 LAB — TSH: TSH: 3.48 u[IU]/mL (ref 0.35–5.50)

## 2024-01-21 LAB — MAGNESIUM: Magnesium: 2 mg/dL (ref 1.5–2.5)

## 2024-01-21 NOTE — Assessment & Plan Note (Signed)
 B12 normalized with monthly IM B 12 injection, continue.

## 2024-01-21 NOTE — Assessment & Plan Note (Signed)
 Has had history of elevated LDL in the past. Not on statin due to patient's age. Repeat lipid panel today. Counseled patient that if LDL is over 190 recommend starting statin. Continue lifestyle modifications with diet, regular exercise.

## 2024-01-21 NOTE — Assessment & Plan Note (Signed)
 Left volar aspect. She has been wrapping the wrist when she quilts. No numbness, tinglings, reduced strength on finger. Has been stable. Continue symptomatic treatment and follow up if worsening pain, or new symptoms arise.

## 2024-01-21 NOTE — Assessment & Plan Note (Addendum)
-   Mostly occurring in the evening time, once or twice a week for about 6-1 year. Resolves with prn Ibuprofen  200 mg (on average <1 tab of Ibuprofen /week. - No vision change, nausea, vomiting, weakness associated with headache. Drinks about 20 Oz of water daily.  - Recommend checking CMP, TSH, Magnesium level today.  - Check BP at home 3 times a week. Check BP when headache occurs. Counseled on stroke like symptoms and recommend evaluation in ED if red flag symptoms as mentioned above.  - Drink about 40 oz of water daily.  - F/U in about 2 months with home BP reading. Patient is very hesitant to start any medications. Consider imaging if headache persists.

## 2024-01-21 NOTE — Assessment & Plan Note (Signed)
 Stable with PRN use of Voltaren gel. Recommend she can use Voltaren gel up to 4 times a day as needed. If pain worsens recommend f/u with sports or ortho.

## 2024-01-21 NOTE — Assessment & Plan Note (Signed)
 Stable since starting home Kegel exercise, declines pelvic floor therapy referral.

## 2024-01-21 NOTE — Assessment & Plan Note (Signed)
Check A1c Continue healthy diet and regular exercise 

## 2024-01-21 NOTE — Assessment & Plan Note (Signed)
 Involving right shin, nape of neck.  Patient reports these has been present for years and has not changed in character.  Right shin rash suspicious for psoriasis vs atopy.   If this worsens recommend referral to dermatology.

## 2024-01-21 NOTE — Assessment & Plan Note (Signed)
-   Possible white coat syndrome per patient. Normal home readings. She is very hesitant on starting any prescription medication due to concern for side effects from medications.  - Check blood pressure at home three times a week and log readings. Goal BP <140/80 mmHg.  - Adopt DASH diet with low sodium and potassium-rich foods. - Increase daily water intake to 25-30 ounces. - Follow up in 8 weeks with home blood pressure readings. - Consider low-dose antihypertensive medication if home readings remain elevated.

## 2024-01-21 NOTE — Patient Instructions (Signed)
-   Check blood pressure 3 times a week at different time. Please keep log of your blood pressure reading at home and bring it for review during follow up visit. I am also attaching a DASH diet information to help with blood pressure.   - If you were to develop tinglings, numbness on left fingers or to notice worsening swelling on the bumpy area please let me know.   - If you notice worsening color of rash on the right shin please let me know.   - You can try Voltaren gel up to four times a day on knees, hands to help with pain, aches.

## 2024-01-21 NOTE — Assessment & Plan Note (Signed)
 Continue calcium  5000 units, Last DEXA 06/2022, repeat in 06/2025

## 2024-01-21 NOTE — Progress Notes (Signed)
 Established Patient Office Visit TOC from Dr. Eual    Subjective  Patient ID: Kristin Valdez, female    DOB: [birth year]  Age: 88 y.o. 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  HPI Discussed the use of AI scribe software for clinical note transcription with the patient, who gave verbal consent to proceed.  History of Present Illness Kristin Valdez is an 88 year old female who presents to establish care.   - She has been experiencing headaches and hot flashes primarily in the evening for the past six months to a year. The headaches are not as bothersome and are effectively alleviated by ibuprofen , which she takes infrequently, about once every week to week and a half, due to concerns about potential side effects. She also experiences hot flashes and has a long-standing history of menstrual problems since her teenage years. No associated symptoms such as chest pain, palpitations, or changes in vision, speech, or memory. She denies known history of glaucoma.   - Her past medical history includes breast cancer affecting both sides.  -  She has a history of osteopenia and takes vitamin D  daily 5000 units.   - She also receives monthly B12 injections for previously low B12 levels.   - She has experienced stress urinary incontinence in the past, primarily with sneezing and coughing, but reports that it has not been a significant issue recently. She has tried Kegel exercise in the past.   - She lives alone at a home. She has a daughter who lives close by. She stopped drinking wine about a year ago and primarily consumes homemade meals, avoiding packaged foods. 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  - She has  a history of elevated blood pressure readings in the past. She monitors her blood pressure at home and reports normal readings. She also has a history of prediabetes and slightly elevated cholesterol levels, which have been managed with diet and exercise.  She experiences pain in her hands and knees, particularly when quilting, and uses a wrist brace for support. She has been using diclofenac gel for pain relief.   ROS As per HPI    Objective:     BP (!) 145/80 (BP Location: Right Arm, Cuff Size: Large)   Pulse 83   Ht 5' 5 (1.651 m)   Wt 164 lb 9.6 oz (74.7 kg)   SpO2 97%   BMI 27.39 kg/m      01/21/2024   11:09 AM 08/09/2023    9:42 AM 07/10/2023    9:23 AM  Depression screen PHQ 2/9  Decreased Interest 0 0 0  Down, Depressed, Hopeless 1 3 1   PHQ - 2 Score 1 3 1   Altered sleeping 0 0 0  Tired, decreased energy 0 0 0  Change in appetite 0 0 0  Feeling bad or failure about yourself  0 0 0  Trouble concentrating 0 0 0  Moving slowly or fidgety/restless 0 0 0  Suicidal thoughts 0 0 0  PHQ-9 Score 1 3 1   Difficult doing work/chores Not difficult at all Somewhat difficult Not difficult at all      01/21/2024   11:09 AM 07/10/2023    9:23 AM 07/04/2022  10:26 AM  GAD 7 : Generalized Anxiety Score  Nervous, Anxious, on Edge 0 0 0  Control/stop worrying 0 0 0  Worry too much - different things 0 0 0  Trouble relaxing 0 0 0  Restless 0 0 0  Easily annoyed or irritable 0 0 0  Afraid - awful might happen 0 0 0  Total GAD 7 Score 0 0 0  Anxiety Difficulty Not difficult at all Not difficult at all Not difficult at all      01/21/2024   11:09 AM 08/09/2023    9:42 AM 07/10/2023    9:23 AM  Depression screen PHQ 2/9  Decreased Interest 0 0 0  Down, Depressed, Hopeless 1 3 1   PHQ - 2 Score 1 3 1   Altered sleeping 0 0 0  Tired, decreased energy 0 0 0  Change in appetite 0 0 0  Feeling bad or failure about yourself  0 0 0  Trouble concentrating 0 0 0  Moving slowly or  fidgety/restless 0 0 0  Suicidal thoughts 0 0 0  PHQ-9 Score 1 3 1   Difficult doing work/chores Not difficult at all Somewhat difficult Not difficult at all      01/21/2024   11:09 AM 07/10/2023    9:23 AM 07/04/2022   10:26 AM  GAD 7 : Generalized Anxiety Score  Nervous, Anxious, on Edge 0 0 0  Control/stop worrying 0 0 0  Worry too much - different things 0 0 0  Trouble relaxing 0 0 0  Restless 0 0 0  Easily annoyed or irritable 0 0 0  Afraid - awful might happen 0 0 0  Total GAD 7 Score 0 0 0  Anxiety Difficulty Not difficult at all Not difficult at all Not difficult at all   SDOH Screenings   Food Insecurity: No Food Insecurity (08/09/2023)  Housing: Unknown (08/09/2023)  Transportation Needs: No Transportation Needs (08/09/2023)  Utilities: Not At Risk (08/09/2023)  Alcohol Screen: Low Risk  (08/09/2023)  Depression (PHQ2-9): Low Risk  (01/21/2024)  Financial Resource Strain: Low Risk  (08/09/2023)  Physical Activity: Inactive (08/09/2023)  Social Connections: Moderately Integrated (08/09/2023)  Stress: No Stress Concern Present (08/09/2023)  Tobacco Use: Medium Risk (01/21/2024)  Health Literacy: Adequate Health Literacy (08/09/2023)     Physical Exam Constitutional:      Appearance: Normal appearance.  HENT:     Head: Normocephalic and atraumatic.     Right Ear: Tympanic membrane normal.     Ears:     Comments: Hearing aid on left. TM b/l normal.     Mouth/Throat:     Mouth: Mucous membranes are moist.  Eyes:     Pupils: Pupils are equal, round, and reactive to light.  Neck:     Thyroid : No thyroid  mass or thyroid  tenderness.  Cardiovascular:     Rate and Rhythm: Normal rate and regular rhythm.  Pulmonary:     Effort: Pulmonary effort is normal.     Breath sounds: Normal breath sounds.  Abdominal:     General: Bowel sounds are normal.     Palpations: Abdomen is soft.     Tenderness: There is no guarding.  Musculoskeletal:     Cervical back: Neck supple. No  rigidity.     Right lower leg: No edema.     Left lower leg: No edema.     Comments: Left wrist: Volar ganglionic cyst on medial aspect.  Bilateral lower legs varicose veins without ulceration noted.   Skin:  General: Skin is warm.     Findings: Lesion (right shin: well-demarcated mildly erythematous plaque with some white scale within the plaque. Nape of neck: Mildly erythematous well-demaracted lesion without scale, discharge, ulceration) present.  Neurological:     Mental Status: She is alert and oriented to person, place, and time.     Motor: No weakness.     Gait: Gait normal.  Psychiatric:        Mood and Affect: Mood normal.        Behavior: Behavior normal.        No results found for any visits on 01/21/24.  The ASCVD Risk score (Arnett DK, et al., 2019) failed to calculate for the following reasons:   The 2019 ASCVD risk score is only valid for ages 44 to 42     Assessment & Plan:   Primary hypertension Assessment & Plan: - Possible white coat syndrome per patient. Normal home readings. She is very hesitant on starting any prescription medication due to concern for side effects from medications.  - Check blood pressure at home three times a week and log readings. Goal BP <140/80 mmHg.  - Adopt DASH diet with low sodium and potassium-rich foods. - Increase daily water intake to 25-30 ounces. - Follow up in 8 weeks with home blood pressure readings. - Consider low-dose antihypertensive medication if home readings remain elevated.  Orders: -     Comprehensive metabolic panel with GFR -     CBC with Differential/Platelet -     TSH -     Magnesium  Mixed hyperlipidemia Assessment & Plan: Has had history of elevated LDL in the past. Not on statin due to patient's age. Repeat lipid panel today. Counseled patient that if LDL is over 190 recommend starting statin. Continue lifestyle modifications with diet, regular exercise.   Orders: -     Lipid  panel  Prediabetes Assessment & Plan: Check A1c.  Continue healthy diet and regular exercise.  Orders: -     Hemoglobin A1c  Osteopenia of multiple sites Assessment & Plan: Continue calcium  5000 units, Last DEXA 06/2022, repeat in 06/2025   Episodic tension-type headache, not intractable Assessment & Plan: - Mostly occurring in the evening time, once or twice a week for about 6-1 year. Resolves with prn Ibuprofen  200 mg (on average <1 tab of Ibuprofen /week. - No vision change, nausea, vomiting, weakness associated with headache. Drinks about 20 Oz of water daily.  - Recommend checking CMP, TSH, Magnesium level today.  - Check BP at home 3 times a week. Check BP when headache occurs. Counseled on stroke like symptoms and recommend evaluation in ED if red flag symptoms as mentioned above.  - Drink about 40 oz of water daily.  - F/U in about 2 months with home BP reading. Patient is very hesitant to start any medications. Consider imaging if headache persists.     Superficial skin lesion Assessment & Plan: Involving right shin, nape of neck.  Patient reports these has been present for years and has not changed in character.  Right shin rash suspicious for psoriasis vs atopy.   If this worsens recommend referral to dermatology.   Vitamin B 12 deficiency Assessment & Plan: B12 normalized with monthly IM B 12 injection, continue.    Ganglion cyst Assessment & Plan: Left volar aspect. She has been wrapping the wrist when she quilts. No numbness, tinglings, reduced strength on finger. Has been stable. Continue symptomatic treatment and follow up if worsening pain,  or new symptoms arise.    Chronic pain of left knee Assessment & Plan: Stable with PRN use of Voltaren gel. Recommend she can use Voltaren gel up to 4 times a day as needed. If pain worsens recommend f/u with sports or ortho.    Stress incontinence Assessment & Plan: Stable since starting home Kegel exercise,  declines pelvic floor therapy referral.     I personally spent a total of 60 minutes in the care of the patient today including preparing to see the patient, performing a medically appropriate exam/evaluation, counseling and educating, placing orders, documenting clinical information in the EHR, independently interpreting results, and communicating results.  Return in about 2 months (around 03/22/2024).   Luke Shade, MD

## 2024-01-22 ENCOUNTER — Ambulatory Visit: Payer: Self-pay

## 2024-01-22 DIAGNOSIS — E782 Mixed hyperlipidemia: Secondary | ICD-10-CM

## 2024-01-22 MED ORDER — ROSUVASTATIN CALCIUM 5 MG PO TABS: 5.0000 mg | ORAL_TABLET | ORAL | 1 refills | Status: DC

## 2024-01-22 NOTE — Progress Notes (Signed)
 Please let the patient know her HbA1c is slightly elevated compared to 4 months ago, it went from 6.4% to 6.6% putting her at diabetic range. I don't want her to stress too much about this as she is a Product/process development scientist. I recommend she cuts down on carbohydrate rich food like bread, pasta, sugar and repeat A1c in 3 months.   Her kidney, liver function, electrolytes including Magnesium is within normal range. CBC, thyroid  function is normal.  Cholesterol looks stable with mild elevation in bad cholesterol or LDL cholesterol. With increase in blood glucose, I do recommend adding a small dose of statin at bedtime every other day to help reduce risk of heart attack, stroke. Crestor  5 mg, every other night. I will only send the medication if she is willing.  Thank you,  Luke Shade, MD

## 2024-01-22 NOTE — Progress Notes (Signed)
 1. Mixed hyperlipidemia (Primary) - rosuvastatin  (CRESTOR ) 5 MG tablet; Take 1 tablet (5 mg total) by mouth every other day.  Dispense: 90 tablet; Refill: 1  Kristin Hedstrom, MD

## 2024-01-28 ENCOUNTER — Ambulatory Visit (INDEPENDENT_AMBULATORY_CARE_PROVIDER_SITE_OTHER)

## 2024-01-28 DIAGNOSIS — E538 Deficiency of other specified B group vitamins: Secondary | ICD-10-CM

## 2024-01-28 MED ORDER — CYANOCOBALAMIN 1000 MCG/ML IJ SOLN
1000.0000 ug | Freq: Once | INTRAMUSCULAR | Status: AC
  Administered 2024-01-28: 1000 ug via INTRAMUSCULAR

## 2024-01-28 NOTE — Progress Notes (Signed)
 Patient is in office today for a nurse visit for B12 Injection. Patient Injection was given in the  Right upper quad. gluteus. Patient tolerated injection well.

## 2024-02-24 ENCOUNTER — Ambulatory Visit

## 2024-02-26 ENCOUNTER — Ambulatory Visit (INDEPENDENT_AMBULATORY_CARE_PROVIDER_SITE_OTHER)

## 2024-02-26 DIAGNOSIS — E538 Deficiency of other specified B group vitamins: Secondary | ICD-10-CM | POA: Diagnosis not present

## 2024-02-26 MED ORDER — CYANOCOBALAMIN 1000 MCG/ML IJ SOLN
1000.0000 ug | Freq: Once | INTRAMUSCULAR | Status: AC
  Administered 2024-02-26: 1000 ug via INTRAMUSCULAR

## 2024-02-26 NOTE — Progress Notes (Signed)
 Patient was administered a B12 injection into her left upper quadrant- gluteus muscle per Patient. Patient tolerated the B12 injection well.

## 2024-03-30 ENCOUNTER — Ambulatory Visit

## 2024-03-30 DIAGNOSIS — E538 Deficiency of other specified B group vitamins: Secondary | ICD-10-CM | POA: Diagnosis not present

## 2024-03-30 MED ORDER — CYANOCOBALAMIN 1000 MCG/ML IJ SOLN
1000.0000 ug | Freq: Once | INTRAMUSCULAR | Status: AC
  Administered 2024-03-30: 1000 ug via INTRAMUSCULAR

## 2024-03-30 NOTE — Progress Notes (Signed)
 Patient is in office today for a nurse visit for B12 Injection. Patient Injection was given in the  Right upper quad. gluteus. Patient tolerated injection well.

## 2024-04-17 ENCOUNTER — Ambulatory Visit

## 2024-04-17 VITALS — BP 132/80 | HR 73 | Temp 98.1°F | Wt 157.4 lb

## 2024-04-17 DIAGNOSIS — E538 Deficiency of other specified B group vitamins: Secondary | ICD-10-CM

## 2024-04-17 DIAGNOSIS — J3489 Other specified disorders of nose and nasal sinuses: Secondary | ICD-10-CM | POA: Insufficient documentation

## 2024-04-17 DIAGNOSIS — R7303 Prediabetes: Secondary | ICD-10-CM

## 2024-04-17 DIAGNOSIS — E782 Mixed hyperlipidemia: Secondary | ICD-10-CM

## 2024-04-17 NOTE — Assessment & Plan Note (Signed)
 Previous A1c 6.6%. Noted dietary changes and weight loss. Anticipated glucose improvement. Repeat fasting A1c after November 26th, 2025. Continue dietary changes and weight managemen Orders:   HgB A1c; Future

## 2024-04-17 NOTE — Assessment & Plan Note (Signed)
 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 D/D includes angiofibroma, BCC. She will follow up with dermatologist  Dr. Hester  first. If she needs surgery referral for potential excision she will reach out to our office.

## 2024-04-17 NOTE — Assessment & Plan Note (Signed)
 Chronic. Rosuvastatin  5 mg prescribed after her last lab in August, she did not start medication and prefers dietary management. Discussed increased stroke and heart attack risk without statin. Repeat fasting cholesterol levels after November 26th, 2025. Continue dietary management. Orders:   Lipid panel; Future

## 2024-04-17 NOTE — Assessment & Plan Note (Signed)
 Currently being treated with IM B12 injection, repeat B12 with next lab.  Orders:   B12; Future

## 2024-04-17 NOTE — Progress Notes (Signed)
 Established Patient Office Visit   Subjective  Patient ID: Kristin Valdez, female    DOB: May 30, 1935  Age: 88 y.o. MRN: [id number]  Chief Complaint  Patient presents with   Hypertension   Hyperlipidemia   Skin Tag    Discussed the use of AI scribe software for clinical note transcription with the patient, who gave verbal consent to proceed.  History of Present Illness Kristin Valdez is an 88 year old female who presents for follow-up on blood pressure and dietary changes.   Her blood pressure readings at home have mostly been in the 120s/70s range, with occasional readings in the 130s/80s. She is not currently on any blood pressure medication and prefers to manage her blood pressure through lifestyle changes.  A1c was 6.6% during 01/21/24.She was recommended to start Crestor  after her last lab showing elevation in LDL cholesterol, elevated J8r from baseline. She never started the medication.   She has implemented lifestyle modifications and lost weight since her last visit. Her weight during last visit was 164 compared to 157 lbs today. She feels better and has noticed a reduction in headaches since changing her diet.  She has a lesion on the base of right nostril that is draining but not painful. She is established with dermatologist Dr. Hester and plans on following up with him.    Her family history includes a brother who had a stroke, attributed to alcoholism, but no other significant history of heart attacks or strokes. Her mother lived to 61, and her father passed away from prostate cancer.  Socially, she stays active by quilting and going out to lunch with friends and her daughter weekly. She spends most of her day in her sewing room and has made efforts to stay physically active by moving around frequently during her quilting activities.    ROS As per HPI    Objective:     BP 132/80 (BP Location: Left Arm, Patient Position: Sitting, Cuff Size: Normal)   Pulse 73   Temp  98.1 F (36.7 C) (Oral)   Wt 157 lb 6.4 oz (71.4 kg)   SpO2 97%   BMI 26.19 kg/m      04/17/2024   11:33 AM 01/21/2024   11:09 AM 08/09/2023    9:42 AM  Depression screen PHQ 2/9  Decreased Interest 0 0 0  Down, Depressed, Hopeless 0 1 3  PHQ - 2 Score 0 1 3  Altered sleeping 0 0 0  Tired, decreased energy 0 0 0  Change in appetite 0 0 0  Feeling bad or failure about yourself  0 0 0  Trouble concentrating 0 0 0  Moving slowly or fidgety/restless 0 0 0  Suicidal thoughts 0 0 0  PHQ-9 Score 0 1  3   Difficult doing work/chores Not difficult at all Not difficult at all Somewhat difficult     Data saved with a previous flowsheet row definition      04/17/2024   11:33 AM 01/21/2024   11:09 AM 07/10/2023    9:23 AM 07/04/2022   10:26 AM  GAD 7 : Generalized Anxiety Score  Nervous, Anxious, on Edge 0 0 0 0  Control/stop worrying 0 0 0 0  Worry too much - different things 0 0 0 0  Trouble relaxing 0 0 0 0  Restless 0 0 0 0  Easily annoyed or irritable 0 0 0 0  Afraid - awful might happen 0 0 0 0  Total GAD 7 Score  0 0 0 0  Anxiety Difficulty Not difficult at all Not difficult at all Not difficult at all Not difficult at all      04/17/2024   11:33 AM 01/21/2024   11:09 AM 08/09/2023    9:42 AM  Depression screen PHQ 2/9  Decreased Interest 0 0 0  Down, Depressed, Hopeless 0 1 3  PHQ - 2 Score 0 1 3  Altered sleeping 0 0 0  Tired, decreased energy 0 0 0  Change in appetite 0 0 0  Feeling bad or failure about yourself  0 0 0  Trouble concentrating 0 0 0  Moving slowly or fidgety/restless 0 0 0  Suicidal thoughts 0 0 0  PHQ-9 Score 0 1  3   Difficult doing work/chores Not difficult at all Not difficult at all Somewhat difficult     Data saved with a previous flowsheet row definition      04/17/2024   11:33 AM 01/21/2024   11:09 AM 07/10/2023    9:23 AM 07/04/2022   10:26 AM  GAD 7 : Generalized Anxiety Score  Nervous, Anxious, on Edge 0 0 0 0  Control/stop worrying  0 0 0 0  Worry too much - different things 0 0 0 0  Trouble relaxing 0 0 0 0  Restless 0 0 0 0  Easily annoyed or irritable 0 0 0 0  Afraid - awful might happen 0 0 0 0  Total GAD 7 Score 0 0 0 0  Anxiety Difficulty Not difficult at all Not difficult at all Not difficult at all Not difficult at all   SDOH Screenings   Food Insecurity: No Food Insecurity (08/09/2023)  Housing: Unknown (08/09/2023)  Transportation Needs: No Transportation Needs (08/09/2023)  Utilities: Not At Risk (08/09/2023)  Alcohol Screen: Low Risk  (08/09/2023)  Depression (PHQ2-9): Low Risk  (04/17/2024)  Financial Resource Strain: Low Risk  (08/09/2023)  Physical Activity: Inactive (08/09/2023)  Social Connections: Moderately Integrated (08/09/2023)  Stress: No Stress Concern Present (08/09/2023)  Tobacco Use: Medium Risk (04/17/2024)  Health Literacy: Adequate Health Literacy (08/09/2023)     Physical Exam Constitutional:      Appearance: Normal appearance.  HENT:     Head: Normocephalic and atraumatic.     Comments: Wearing hearing aids b/l    Right Ear: There is no impacted cerumen.     Left Ear: There is no impacted cerumen.     Mouth/Throat:     Mouth: Mucous membranes are moist.  Cardiovascular:     Rate and Rhythm: Normal rate.  Pulmonary:     Effort: Pulmonary effort is normal.     Breath sounds: Normal breath sounds.  Abdominal:     Palpations: Abdomen is soft.     Tenderness: There is no abdominal tenderness. There is no guarding.  Musculoskeletal:     Cervical back: Neck supple. No rigidity.  Lymphadenopathy:     Cervical: No cervical adenopathy.  Skin:    General: Skin is warm.     Findings: Lesion (single flesh colored dome shaped papule on the base of right nostril. Size about 6 mm in diameter, with central erythema, no sign of infection.) present.  Neurological:     Mental Status: She is alert and oriented to person, place, and time.  Psychiatric:        Mood and Affect: Mood normal.         Behavior: Behavior normal.        No results found for any  visits on 04/17/24.  The ASCVD Risk score (Arnett DK, et al., 2019) failed to calculate for the following reasons:   The 2019 ASCVD risk score is only valid for ages 57 to 89     Assessment & Plan:   Assessment & Plan Mixed hyperlipidemia Chronic. Rosuvastatin  5 mg prescribed after her last lab in August, she did not start medication and prefers dietary management. Discussed increased stroke and heart attack risk without statin. Repeat fasting cholesterol levels after November 26th, 2025. Continue dietary management. Orders:   Lipid panel; Future   Prediabetes Previous A1c 6.6%. Noted dietary changes and weight loss. Anticipated glucose improvement. Repeat fasting A1c after November 26th, 2025. Continue dietary changes and weight managemen Orders:   HgB A1c; Future  B12 deficiency Currently being treated with IM B12 injection, repeat B12 with next lab.  Orders:   B12; Future  Lesion of nose Chronic pedunculated papule on base of right nostril with intermittent drainage. D/D includes angiofibroma, BCC. She will follow up with dermatologist  Dr. Hester  first. If she needs surgery referral for potential excision she will reach out to our office.       Return in about 6 months (around 10/15/2024) for fating labs at patient's convinience after 04/22/24, f/u with Dr. Abbey 6 Kristin SABRA Luke Abbey, MD

## 2024-04-17 NOTE — Patient Instructions (Addendum)
-   Let's schedule lab only appointment anytime after 04/22/24, fasting. I will update you on the lab results.   - Please update me on your appointment  with Dr. Hester  and if you need a referral to general surgeon just reach out to us .

## 2024-04-27 ENCOUNTER — Ambulatory Visit: Payer: Self-pay

## 2024-04-27 NOTE — Telephone Encounter (Signed)
 FYI Only or Action Required?: Action required by provider: Pt would like to know if B12 shot and routine labwork (both scheduled for Wednesday) can be done at acute visit appt tomorrow.  Patient was last seen in primary care on 04/17/2024 by Abbey Bruckner, MD.  Called Nurse Triage reporting Dysuria.  Symptoms began several days ago.  Interventions attempted: Nothing.  Symptoms are: gradually worsening.  Triage Disposition: See Physician Within 24 Hours  Patient/caregiver understands and will follow disposition?: Yes  Copied from CRM #[id number]. Topic: Clinical - Red Word Triage >> Apr 27, 2024  4:14 PM Aisha D wrote: Red Word that prompted transfer to Nurse Triage:pain   Pt stated that she is experiencing pain and cloudy urine. Pt thinks she has a uti and wants to schedule an appt with the provider.   ----------------------------------------------------------------------- From previous Reason for Contact - Scheduling: Patient/patient representative is calling to schedule an appointment. Refer to attachments for appointment information. Reason for Disposition  Age > 50 years  Answer Assessment - Initial Assessment Questions Pt reports onset of pain with urination, frequency, and cloudy urine 3-4 days ago. Denies fever, blood in urine or flank pain. Scheduled appt with different provider at home office tomorrow d/t no PCP availability within timeframe. Pt also scheduled for B12 shot and routine labwork on Wednesday at home office, asking if these can be done during her appt tomorrow. Called [facility] and spoke with Suzen, reports insurance may not cover them if they are moved up tomorrow. Asked to send message over to clinic to see if they can be added onto pts appt tomorrow, otherwise pt will keep appt on Wednesday to have them done. Advised UC or ED for worsening symptoms.   1. SEVERITY: How bad is the pain?  (e.g., Scale 1-10; mild, moderate, or severe)     3-4/10  2. FREQUENCY: How  many times have you had painful urination today?      Frequent urination  3. PATTERN: Is pain present every time you urinate or just sometimes?      Yes  4. ONSET: When did the painful urination start?      3-4 days ago  5. FEVER: Do you have a fever? If Yes, ask: What is your temperature, how was it measured, and when did it start?     Denies  6. PAST UTI: Have you had a urine infection before? If Yes, ask: When was the last time? and What happened that time?      Denies  7. CAUSE: What do you think is causing the painful urination?  (e.g., UTI, scratch, Herpes sore)     Unsure  8. OTHER SYMPTOMS: Do you have any other symptoms? (e.g., blood in urine, flank pain, genital sores, urgency, vaginal discharge)     Hx of vaginal itching, no itching currently  Protocols used: Urination Pain - Female-A-AH

## 2024-04-28 ENCOUNTER — Ambulatory Visit: Admitting: Family

## 2024-04-28 ENCOUNTER — Encounter: Payer: Self-pay | Admitting: Family

## 2024-04-28 ENCOUNTER — Ambulatory Visit: Payer: Self-pay

## 2024-04-28 VITALS — BP 126/82 | HR 92 | Temp 98.0°F | Ht 65.0 in | Wt 155.4 lb

## 2024-04-28 DIAGNOSIS — E782 Mixed hyperlipidemia: Secondary | ICD-10-CM

## 2024-04-28 DIAGNOSIS — E538 Deficiency of other specified B group vitamins: Secondary | ICD-10-CM

## 2024-04-28 DIAGNOSIS — A499 Bacterial infection, unspecified: Secondary | ICD-10-CM

## 2024-04-28 DIAGNOSIS — R7303 Prediabetes: Secondary | ICD-10-CM

## 2024-04-28 DIAGNOSIS — N393 Stress incontinence (female) (male): Secondary | ICD-10-CM

## 2024-04-28 LAB — LIPID PANEL
Cholesterol: 185 mg/dL (ref 0–200)
HDL: 54.2 mg/dL (ref >39.00)
LDL Cholesterol: 109 mg/dL — ABNORMAL HIGH (ref 0–99)
NonHDL: 131.08
Total CHOL/HDL Ratio: 3
Triglycerides: 108 mg/dL (ref 0.0–149.0)
VLDL: 21.6 mg/dL (ref 0.0–40.0)

## 2024-04-28 LAB — POC URINALSYSI DIPSTICK (AUTOMATED)
Bilirubin, UA: NEGATIVE
Glucose, UA: NEGATIVE
Ketones, UA: NEGATIVE
Nitrite, UA: POSITIVE
Protein, UA: POSITIVE — AB
Spec Grav, UA: 1.01 (ref 1.010–1.025)
Urobilinogen, UA: 0.2 U/dL
pH, UA: 5.5 (ref 5.0–8.0)

## 2024-04-28 LAB — VITAMIN B12: Vitamin B-12: >1500 pg/mL — ABNORMAL HIGH (ref 211–911)

## 2024-04-28 LAB — HEMOGLOBIN A1C: Hgb A1c MFr Bld: 6 % (ref 4.6–6.5)

## 2024-04-28 MED ORDER — CYANOCOBALAMIN 1000 MCG/ML IJ SOLN
1000.0000 ug | Freq: Once | INTRAMUSCULAR | Status: AC
  Administered 2024-04-28: 1000 ug via INTRAMUSCULAR

## 2024-04-28 MED ORDER — CEPHALEXIN 500 MG PO CAPS: 500.0000 mg | ORAL_CAPSULE | Freq: Two times a day (BID) | ORAL | 0 refills | Status: DC

## 2024-04-28 NOTE — Progress Notes (Unsigned)
 Patient was administered a B12 injection into her left upper outer quadrant. Patient tolerated the B12 injection well.

## 2024-04-29 ENCOUNTER — Encounter: Payer: Self-pay | Admitting: Family

## 2024-04-29 ENCOUNTER — Ambulatory Visit

## 2024-04-29 MED ORDER — B-12 1000 MCG PO CAPS: 2000.0000 ug | ORAL_CAPSULE | Freq: Every day | ORAL | 3 refills | Status: AC

## 2024-04-29 NOTE — Progress Notes (Signed)
 Acute Office Visit  Subjective:     Patient ID: Kristin Valdez, female    DOB: 10-Nov-1935, 88 y.o.   MRN: [id number]  Chief Complaint  Patient presents with  . Acute Visit    Pain with urination and urgency since Saturday    HPI Patient is in today with complaints of urinary frequency, urgency x 3 days and worsening.  She denies any fever or chills.  No abdominal pain or back pain.  Patient also here to have labs drawn for her next office visit as well as to have a B12 injection.  Review of Systems  Genitourinary:  Positive for dysuria, frequency and urgency. Negative for flank pain and hematuria.  All other systems reviewed and are negative.  Past Medical History:  Diagnosis Date  . Adenocarcinoma of breast (HCC) 11/08/2016   Overview:  No chemo/XRT 2/2 localized lesion.  Unable to tolerate Tamoxifen.  . Breast cancer (HCC) 2014-2015   left breast cancer  . Chickenpox   . 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 Marital status: Widowed    Spouse name: Not on file  . Number of children: Not on file  . Years of education: Not on file  . Highest education level: Not on file  Occupational History  . Not on file  Tobacco Use  . Smoking status: Former    Current packs/day: 0.00    Types: Cigarettes    Quit date: 01/09/1971    Years since quitting: 53.3  . Smokeless tobacco: Never  Vaping Use  . Vaping status: Never Used  Substance and Sexual Activity  . Alcohol use: Yes    Comment: Occasional  . Drug use: No  . Sexual activity: Never  Other Topics Concern  . Not on file  Social History Narrative   Works 2-3 days a week at [employer].   Social Drivers of Health   Financial Resource Strain: Low Risk  (08/09/2023)   Overall Financial Resource Strain (CARDIA)   . Difficulty of Paying Living Expenses: Not hard at all  Food Insecurity: No Food Insecurity (08/09/2023)   Hunger  Vital Sign   . Worried About Programme Researcher, Broadcasting/film/video in the Last Year: Never true   . Ran Out of Food in the Last Year: Never true  Transportation Needs: No Transportation Needs (08/09/2023)   PRAPARE - Transportation   . Lack of Transportation (Medical): No   . Lack of Transportation (Non-Medical): No  Physical Activity: Inactive (08/09/2023)   Exercise Vital Sign   . Days of Exercise per Week: 0 days   . Minutes of Exercise per Session: 0 min  Stress: No Stress Concern Present (08/09/2023)   [employer] of Occupational Health - Occupational Stress Questionnaire   . Feeling of Stress : Only a little  Social Connections: Moderately Integrated (08/09/2023)   Social Connection and Isolation Panel   . Frequency of Communication with Friends and Family: More than three times a week   . Frequency of Social Gatherings with Friends and Family: Once a week   . Attends Religious Services: More than 4 times per year   . Active Member of Clubs or Organizations: Yes   . Attends Banker Meetings: More than 4 times per year   . Marital Status: Widowed  Intimate Partner Violence: Not At Risk (08/09/2023)   Humiliation, Afraid, Rape, and Kick questionnaire   . Fear  of Current or Ex-Partner: No   . Emotionally Abused: No   . Physically Abused: No   . Sexually Abused: No    Past Surgical History:  Procedure Laterality Date  . ABDOMINAL HYSTERECTOMY    . APPENDECTOMY    . BREAST BIOPSY Left    Stereotactic biopsy  . BREAST BIOPSY Right 05/02/2016   US  guided biopsy  . MASTECTOMY Left 2015  . MASTECTOMY W/ SENTINEL NODE BIOPSY Right 05/24/2016   Procedure: MASTECTOMY WITH SENTINEL LYMPH NODE BIOPSY;  Surgeon: Dorothyann LITTIE Husk, MD;  Location: [facility] ORS;  Service: General;  Laterality: Right;    Family History  Problem Relation Age of Onset  . Heart disease Mother   . Prostate cancer Father   . Stroke Brother   . Healthy Daughter     Allergies  Allergen Reactions  .  Amoxicillin  Diarrhea    Hot flashes, headache, night sweats  . Excedrin Extra Strength [Asa-Apap-Caff Buffered] Other (See Comments)    Shaking    Current Outpatient Medications on File Prior to Visit  Medication Sig Dispense Refill  . diclofenac Sodium (VOLTAREN) 1 % GEL Apply topically as needed.    . ibuprofen  (ADVIL ) 200 MG tablet Take 200 mg by mouth every 6 (six) hours as needed.    . VITAMIN D , CHOLECALCIFEROL, PO Take 5,000 Units by mouth daily. (Patient not taking: Reported on 04/28/2024)     No current facility-administered medications on file prior to visit.    BP 126/82   Pulse 92   Temp 98 F (36.7 C)   Ht 5' 5 (1.651 m)   Wt 155 lb 6.4 oz (70.5 kg)   SpO2 92%   BMI 25.86 kg/m chart      Objective:    BP 126/82   Pulse 92   Temp 98 F (36.7 C)   Ht 5' 5 (1.651 m)   Wt 155 lb 6.4 oz (70.5 kg)   SpO2 92%   BMI 25.86 kg/m    Physical Exam Vitals and nursing note reviewed.  Constitutional:      Appearance: Normal appearance. She is normal weight.  Cardiovascular:     Rate and Rhythm: Normal rate and regular rhythm.  Pulmonary:     Effort: Pulmonary effort is normal.     Breath sounds: Normal breath sounds.  Abdominal:     General: Abdomen is flat. Bowel sounds are normal.     Palpations: Abdomen is soft.  Musculoskeletal:        General: Normal range of motion.     Cervical back: Normal range of motion and neck supple.  Skin:    General: Skin is warm and dry.  Neurological:     General: No focal deficit present.     Mental Status: She is alert and oriented to person, place, and time. Mental status is at baseline.  Psychiatric:        Mood and Affect: Mood normal.        Behavior: Behavior normal.        Thought Content: Thought content normal.    Results for orders placed or performed in visit on 04/28/24  B12  Result Value Ref Range   Vitamin B-12 >1500 (H) 211 - 911 pg/mL  HgB A1c  Result Value Ref Range   Hgb A1c MFr Bld 6.0 4.6 -  6.5 %  Lipid panel  Result Value Ref Range   Cholesterol 185 0 - 200 mg/dL   Triglycerides 891.9 0.0 - 149.0  mg/dL   HDL 45.79 >60.99 mg/dL   VLDL 78.3 0.0 - 59.9 mg/dL   LDL Cholesterol 890 (H) 0 - 99 mg/dL   Total CHOL/HDL Ratio 3    NonHDL 131.08   POCT Urinalysis Dipstick (Automated)  Result Value Ref Range   Color, UA light yellow    Clarity, UA cloudy    Glucose, UA Negative Negative   Bilirubin, UA neg    Ketones, UA neg    Spec Grav, UA 1.010 1.010 - 1.025   Blood, UA small    pH, UA 5.5 5.0 - 8.0   Protein, UA Positive (A) Negative   Urobilinogen, UA 0.2 0.2 or 1.0 E.U./dL   Nitrite, UA pos    Leukocytes, UA Large (3+) (A) Negative        Assessment & Plan:   Problem List Items Addressed This Visit     Stress incontinence - Primary   Relevant Orders   POCT Urinalysis Dipstick (Automated) (Completed)   Urine Culture   Prediabetes   HLD (hyperlipidemia)   B12 deficiency   Other Visit Diagnoses       Bacterial urinary infection       Relevant Medications   cephALEXin (KEFLEX) 500 MG capsule       Meds ordered this encounter  Medications  . cephALEXin (KEFLEX) 500 MG capsule    Sig: Take 1 capsule (500 mg total) by mouth 2 (two) times daily.    Dispense:  14 capsule    Refill:  0  . cyanocobalamin  (VITAMIN B12) injection 1,000 mcg   Call the office if symptoms worsen or persist.  Recheck as scheduled and sooner as needed. No follow-ups on file.  Price Lachapelle B Beatris Belen, FNP

## 2024-05-01 LAB — URINE CULTURE
MICRO NUMBER:: 17302132
SPECIMEN QUALITY:: ADEQUATE

## 2024-05-18 ENCOUNTER — Telehealth: Payer: Self-pay

## 2024-05-18 NOTE — Telephone Encounter (Unsigned)
 Copied from CRM #[id number]. Topic: Clinical - Medication Refill >> May 18, 2024  3:10 PM Victoria A wrote: Medication: cephALEXin  (KEFLEX ) 500 MG capsule  Has the patient contacted their pharmacy? No (Agent: If no, request that the patient contact the pharmacy for the refill. If patient does not wish to contact the pharmacy document the reason why and proceed with request.) (Agent: If yes, when and what did the pharmacy advise?)  This is the patient's preferred pharmacy:  [facility] - [place], [place] - [address] [place] [address] Phone: [phone] Fax: [phone]  Is this the correct pharmacy for this prescription? Yes If no, delete pharmacy and type the correct one.   Has the prescription been filled recently? No  Is the patient out of the medication? Yes  Has the patient been seen for an appointment in the last year OR does the patient have an upcoming appointment? Yes  Can we respond through MyChart? Yes  Agent: Please be advised that Rx refills may take up to 3 business days. We ask that you follow-up with your pharmacy.

## 2024-05-19 ENCOUNTER — Ambulatory Visit: Payer: Self-pay

## 2024-05-19 NOTE — Telephone Encounter (Signed)
 See triage note , refill request addressed in other encounter

## 2024-05-19 NOTE — Telephone Encounter (Signed)
 FYI Only or Action Required?: Action required by provider: pt requesting refill of antibiotics for urinary symptoms .  Patient was last seen in primary care on 04/28/2024 by Douglass Kenney NOVAK, FNP.  Called Nurse Triage reporting Dysuria.  Symptoms began a week ago.  Interventions attempted: Other: increasing fluids, andn cranberry juice.  Symptoms are: stable.  Triage Disposition: See Physician Within 24 Hours  Patient/caregiver understands and will follow disposition?: No, wishes to speak with PCP Reason for Disposition  Age > 50 years  Protocols used: Urination Pain - Female-A-AH   Patient calls to report had UTI earlier this month was prescribed antibiotics keflex  per chart review. this worked well then 1 week after antibiotics symptoms started up again. Currently with some pain with urianting , urgency and slightly cloudy urine. Patient requesting refill of kelfex antibiotics and is concerned about getting this ASAP due to holiday. Patient requesting keflex  be sent to [facility] - [place], [place] - [address]   1. MAIN SYMPTOM: What is the main symptom you are concerned about? (e.g., painful urination, urine frequency)     Little pain with urination 3-4/10 pain level, urinary urgency  2. BETTER-SAME-WORSE: Are you getting better, staying the same, or getting worse compared to how you felt at your last visit to the doctor (most recent medical visit)?     Was treated for UTI symptoms 12/2 , with keflex  , symptoms resolved and returned 1 week ago 3. PAIN: How bad is the pain?  (e.g., Scale 1-10; mild, moderate, or severe)     3-4/10 pain with peeing  4. FEVER: Do you have a fever? If Yes, ask: What is it, how was it measured, and when did it start?     Denies  5. OTHER SYMPTOMS: Do you have any other symptoms? (e.g., blood in the urine, flank pain, vaginal discharge) Feeling fine otherwise      Denies flank, lower back pain , fever , Chills  6. DIAGNOSIS: When  was the UTI diagnosed? By whom? Was it a kidney infection, bladder infection or both?     04/28/2024 7. ANTIBIOTIC: What antibiotic(s) are you taking? How many times per day?     Was taking keflex  not currently taking this finished this  8. ANTIBIOTIC - START DATE: When did you start taking the antibiotic?     04/28/2024  Copied from CRM #[id number]. Topic: Clinical - Medication Refill >> May 18, 2024  3:10 PM Victoria A wrote: Medication: cephALEXin  (KEFLEX ) 500 MG capsule   Has the patient contacted their pharmacy? No (Agent: If no, request that the patient contact the pharmacy for the refill. If patient does not wish to contact the pharmacy document the reason why and proceed with request.) (Agent: If yes, when and what did the pharmacy advise?)   This is the patient's preferred pharmacy:  [facility] - [place], [place] - [address] [place] [address] Phone: [phone] Fax: [phone]

## 2024-05-20 ENCOUNTER — Telehealth: Payer: Self-pay

## 2024-05-20 ENCOUNTER — Ambulatory Visit

## 2024-05-20 ENCOUNTER — Other Ambulatory Visit (HOSPITAL_COMMUNITY)
Admission: RE | Admit: 2024-05-20 | Discharge: 2024-05-20 | Disposition: A | Discharge status: Home / Self Care | Source: Ambulatory Visit

## 2024-05-20 DIAGNOSIS — N390 Urinary tract infection, site not specified: Secondary | ICD-10-CM | POA: Diagnosis not present

## 2024-05-20 DIAGNOSIS — R300 Dysuria: Secondary | ICD-10-CM | POA: Insufficient documentation

## 2024-05-20 LAB — URINALYSIS, ROUTINE W REFLEX MICROSCOPIC
Bilirubin Urine: NEGATIVE
Ketones, ur: NEGATIVE
Nitrite: POSITIVE — AB
Specific Gravity, Urine: 1.025 (ref 1.000–1.030)
Total Protein, Urine: NEGATIVE
Urine Glucose: NEGATIVE
Urobilinogen, UA: 0.2 (ref 0.0–1.0)
pH: 6 (ref 5.0–8.0)

## 2024-05-20 MED ORDER — CIPROFLOXACIN HCL 250 MG PO TABS: 250.0000 mg | ORAL_TABLET | Freq: Two times a day (BID) | ORAL | 0 refills | Status: AC

## 2024-05-20 NOTE — Assessment & Plan Note (Addendum)
 D/D UTI, vaginitis, genitourinary syndrome of menopause.  Chronic vaginal itching possibly due to contact dermatitis vs bacterial vaginosis or yeast infection from antibiotic use.  Vaginal swab performed to rule out BV, yeast. Management pending results. I will reach out to patient with results and recommendations.  Deferred vaginal estrogen cream due to mastectomy history. Orders:   Urine Culture   Urinalysis, Routine w reflex microscopic   Cervicovaginal ancillary only( [place])   ciprofloxacin  (CIPRO ) 250 MG tablet; Take 1 tablet (250 mg total) by mouth 2 (two) times daily for 3 days.

## 2024-05-20 NOTE — Assessment & Plan Note (Addendum)
 Recurrent urinary tract infection suspected given patient's current symptoms and recent treatment for UTI in 04/28/24.  Previous Keflex  treatment was effective but symptoms recurred.  Prescribed ciprofloxacin  250 mg twice daily for 3 days. Advised to take ciprofloxacin  with food. Encouraged hydration and cranberry juice. Instructed to take acetaminophen  for pain if needed. Await urine culture and vaginal swab results to adjust treatment.  Orders:   Urine Culture   Urinalysis, Routine w reflex microscopic   ciprofloxacin  (CIPRO ) 250 MG tablet; Take 1 tablet (250 mg total) by mouth 2 (two) times daily for 3 days.

## 2024-05-20 NOTE — Progress Notes (Signed)
 "  Acute Office Visit  Subjective:    Patient ID: Kristin Valdez, female    DOB: 01-02-1936, 88 y.o.   MRN: [id number]  Chief Complaint  Patient presents with   Urinary Tract Infection   Urinary Frequency    Discussed the use of AI scribe software for clinical note transcription with the patient, who gave verbal consent to proceed.  History of Present Illness Kristin Valdez is an 88 year old female who presents with recurrent urinary tract infection symptoms.   She was previously treated for a urinary tract infection with Keflex  (500 mg twice a day for seven days) after a urine culture showed Klebsiella pneumoniae on 04/28/24. Significant improvement was noted by the second day of treatment, but symptoms returned approximately one week after completing the antibiotic course. Currently, she experiences burning during urination and a frequent urge to urinate. No fever, chills, or lower back pain.  She has a history of chronic feminine itching, which she manages with a feminine wash used twice daily. A decrease in vaginal itching was noted while on the antibiotic treatment. No significant changes in vaginal itching post-treatment, and the patient did not mention vaginal discharge.  Her current medications include the previously mentioned Keflex , which she tolerated well without side effects.  In her social history, she avoids caffeine and has switched to herbal tea, which has helped reduce shakiness previously experienced with Excedrin due to its caffeine content.  ROS As per HPI    Objective:    BP 130/80   Pulse 94   Temp 98.6 F (37 C) (Oral)   Wt 155 lb (70.3 kg)   SpO2 98%   BMI 25.79 kg/m    Physical Exam Constitutional:      General: She is not in acute distress.    Appearance: Normal appearance.  HENT:     Head: Normocephalic and atraumatic.     Mouth/Throat:     Mouth: Mucous membranes are moist.  Neck:     Thyroid : No thyroid  mass or thyroid  tenderness.   Cardiovascular:     Rate and Rhythm: Normal rate and regular rhythm.  Pulmonary:     Effort: Pulmonary effort is normal.     Breath sounds: Normal breath sounds. No wheezing.  Abdominal:     General: Bowel sounds are normal.     Palpations: Abdomen is soft.     Tenderness: There is no abdominal tenderness. There is no right CVA tenderness, left CVA tenderness, guarding or rebound.  Genitourinary:    Comments: Vaginal swab obtained, external genitalis with thin vaginal tissue Musculoskeletal:     Cervical back: Neck supple. No rigidity.     Right lower leg: No edema.     Left lower leg: No edema.  Skin:    General: Skin is warm.  Neurological:     Mental Status: She is alert and oriented to person, place, and time.  Psychiatric:        Mood and Affect: Mood normal.        Behavior: Behavior normal.     No results found for any visits on 05/20/24. Reviewed following lab result:  Component     Latest Ref Rng 04/28/2024  Color, UA light yellow   Clarity, UA cloudy   Glucose     Negative  Negative   Bilirubin, UA neg   Ketones, UA neg   Specific Gravity, UA     1.010 - 1.025  1.010   RBC, UA small  pH, UA     5.0 - 8.0  5.5   Protein,UA     Negative  Positive !   Urobilinogen, UA     0.2 or 1.0 E.U./dL 0.2   Nitrite, UA pos   Leukocytes,UA     Negative  Large (3+) !   MICRO NUMBER: [id number]   SPECIMEN QUALITY: Adequate   Sample Source URINE   STATUS: FINAL   iSOLATE 1: Klebsiella pneumoniae !        Assessment & Plan:   Assessment & Plan Dysuria D/D UTI, vaginitis, genitourinary syndrome of menopause.  Chronic vaginal itching possibly due to contact dermatitis vs bacterial vaginosis or yeast infection from antibiotic use.  Vaginal swab performed to rule out BV, yeast. Management pending results. I will reach out to patient with results and recommendations.  Deferred vaginal estrogen cream due to mastectomy history. Orders:   Urine Culture   Urinalysis,  Routine w reflex microscopic   Cervicovaginal ancillary only( [place])   ciprofloxacin  (CIPRO ) 250 MG tablet; Take 1 tablet (250 mg total) by mouth 2 (two) times daily for 3 days.  Recurrent UTI Recurrent urinary tract infection suspected given patient's current symptoms and recent treatment for UTI in 04/28/24.  Previous Keflex  treatment was effective but symptoms recurred.  Prescribed ciprofloxacin  250 mg twice daily for 3 days. Advised to take ciprofloxacin  with food. Encouraged hydration and cranberry juice. Instructed to take acetaminophen  for pain if needed. Await urine culture and vaginal swab results to adjust treatment.  Orders:   Urine Culture   Urinalysis, Routine w reflex microscopic   ciprofloxacin  (CIPRO ) 250 MG tablet; Take 1 tablet (250 mg total) by mouth 2 (two) times daily for 3 days.    Return in about 6 months (around 11/18/2024) for Chronic follow up .  Luke Shade, MD  "

## 2024-05-22 ENCOUNTER — Ambulatory Visit: Payer: Self-pay

## 2024-05-22 ENCOUNTER — Telehealth: Payer: Self-pay

## 2024-05-22 DIAGNOSIS — B379 Candidiasis, unspecified: Secondary | ICD-10-CM | POA: Insufficient documentation

## 2024-05-22 LAB — CERVICOVAGINAL ANCILLARY ONLY
Bacterial Vaginitis (gardnerella): NEGATIVE
Candida Glabrata: POSITIVE — AB
Candida Vaginitis: NEGATIVE
Comment: NEGATIVE
Comment: NEGATIVE
Comment: NEGATIVE

## 2024-05-22 LAB — URINE CULTURE
MICRO NUMBER:: 17397214
SPECIMEN QUALITY:: ADEQUATE

## 2024-05-22 MED ORDER — BORIC ACID 600 MG VA SUPP: 600.0000 mg | Freq: Every day | VAGINAL | 0 refills | Status: DC

## 2024-05-22 NOTE — Progress Notes (Signed)
 Please call the patient to let her know vaginal swab came back positive for yeast called candida Glabrata infection, recommend:  Boric acid suppository 600 mg intravaginally once daily for 14 days. I have sent the prescription to the pharmacy. Usually it is well tolerated but she may note some irritation/discomfort when starting this. This is for vaginal use only and not for oral ingestion. Also recommend finishing antibiotic as prescribed.   Thank you,  Luke Shade, MD

## 2024-05-22 NOTE — Telephone Encounter (Signed)
No note attached.

## 2024-05-22 NOTE — Telephone Encounter (Signed)
 Copied from CRM #[id number]. Topic: Clinical - Prescription Issue >> May 22, 2024  4:14 PM Jayma L wrote: Reason for CRM: the refill is considered a compound drug , sending somewhere else is best , she's doesn't know the name of the medicine said it was refilled today. Please reach patient back at [phone]   It was for yeast infection

## 2024-05-25 ENCOUNTER — Other Ambulatory Visit: Payer: Self-pay

## 2024-05-25 DIAGNOSIS — B379 Candidiasis, unspecified: Secondary | ICD-10-CM

## 2024-05-25 MED ORDER — BORIC ACID 600 MG VA SUPP: 600.0000 mg | Freq: Every day | VAGINAL | 0 refills | Status: AC

## 2024-05-25 NOTE — Telephone Encounter (Signed)
 Patient was notified and made aware of prescription being sent to [facility] to [facility], as that the only place to compound medication. Patient verbalized understanding.

## 2024-05-25 NOTE — Telephone Encounter (Signed)
 Spoke with [facility] and was informed they cannot fill the prescription due to it being a compounded drug. Spoke with [facility] and was informed they would need the prescription sent to their location, but needs to note in the prescription to [place] to compound medication. Message and pended prescription have been sent to Padonda Webb, FNP

## 2024-05-25 NOTE — Progress Notes (Signed)
 Patient was notified with last result management encounter per Dr Abbey advised patient to complete Cipro  antibiotics.

## 2024-06-01 ENCOUNTER — Ambulatory Visit

## 2024-06-02 NOTE — Telephone Encounter (Signed)
 Error

## 2024-06-11 ENCOUNTER — Ambulatory Visit

## 2024-06-11 DIAGNOSIS — C44311 Basal cell carcinoma of skin of nose: Secondary | ICD-10-CM

## 2024-06-11 DIAGNOSIS — C4491 Basal cell carcinoma of skin, unspecified: Secondary | ICD-10-CM

## 2024-06-11 DIAGNOSIS — D489 Neoplasm of uncertain behavior, unspecified: Secondary | ICD-10-CM

## 2024-06-11 HISTORY — DX: Basal cell carcinoma of skin, unspecified: C44.91

## 2024-06-11 NOTE — Progress Notes (Signed)
" °  °  Subjective   Kristin Valdez is a 89 y.o. female who presents for the following: Lesion(s) of concern . Patient is new patient  Today patient reports: Patient states spot on the nose, has been present for years. Sometimes itchy, past couple of months has been bleeding.  This patient is accompanied in the office by her daughter.  Review of Systems:    No other skin or systemic complaints except as noted in HPI or Assessment and Plan.  The following portions of the chart were reviewed this encounter and updated as appropriate: medications, allergies, medical history  Relevant Medical History:  n/a   Objective  (SKPE) Well appearing patient in no apparent distress; mood and affect are within normal limits. Examination was performed of the: Focused Exam of: face   Examination notable for: 1 cm pink plaque at right nasal ala.   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 - Epidermal / dermal shaving  Lesion diameter (cm):  1 Informed consent: discussed and consent obtained   Timeout: patient name, date of birth, surgical site, and procedure verified   Procedure prep:  Patient was prepped and draped in usual sterile fashion Prep type:  Isopropyl alcohol Anesthesia: the lesion was anesthetized in a standard fashion   Anesthetic:  1% lidocaine  w/ epinephrine 1-100,000 buffered w/ 8.4% NaHCO3 Instrument used: DermaBlade   Hemostasis achieved with: pressure, aluminum chloride and electrodesiccation   Outcome: patient tolerated procedure well   Post-procedure details: wound care instructions given    Specimen 1 - Surgical pathology Differential Diagnosis: BCC vs other   Check Margins: No 1 cm pink plaque Discussed if biopsy proves skin cancer will refer to Mohs. Patient and daughter in  agreement.  Neoplasm of uncertain behavior -     Epidermal / dermal shaving -     Surgical pathology; Standing    Return to clinic: Return in about 6 months (around 12/09/2024) for TBSE, w/ Dr. Raymund.  I, Almetta Nora, RMA, am acting as scribe for Kristin JAYSON Raymund, MD .   Documentation: I have reviewed the above documentation for accuracy and completeness, and I agree with the above.  Kristin JAYSON Raymund, MD  "

## 2024-06-11 NOTE — Patient Instructions (Addendum)

## 2024-06-15 LAB — SURGICAL PATHOLOGY

## 2024-06-16 ENCOUNTER — Ambulatory Visit: Payer: Self-pay

## 2024-06-16 DIAGNOSIS — C44311 Basal cell carcinoma of skin of nose: Secondary | ICD-10-CM

## 2024-06-16 NOTE — Telephone Encounter (Signed)
-----   Message from Kristin Kanaris, MD sent at 06/16/2024  8:42 AM EST -----  1. Skin, right nasal ala :       BASAL CELL CARCINOMA, NODULAR PATTERN    Please notify patient with below plan: Mohs

## 2024-06-16 NOTE — Telephone Encounter (Signed)
 Discussed pathology results with patient and referral sent to Dr. Armando office.

## 2024-07-02 ENCOUNTER — Encounter: Admitting: Dermatology

## 2024-07-16 ENCOUNTER — Encounter: Admitting: Dermatology

## 2024-08-12 ENCOUNTER — Ambulatory Visit

## 2024-08-21 ENCOUNTER — Ambulatory Visit

## 2024-12-09 ENCOUNTER — Ambulatory Visit
